# Patient Record
Sex: Female | Born: 1985 | Race: Black or African American | Hispanic: No | Marital: Married | Smoking: Current every day smoker
Health system: Southern US, Community
[De-identification: ages and names within clinical notes are randomized; demographics above are authoritative.]

## PROBLEM LIST (undated history)

## (undated) ENCOUNTER — Inpatient Hospital Stay (HOSPITAL_COMMUNITY): Payer: Self-pay

## (undated) DIAGNOSIS — I10 Essential (primary) hypertension: Secondary | ICD-10-CM

## (undated) DIAGNOSIS — O139 Gestational [pregnancy-induced] hypertension without significant proteinuria, unspecified trimester: Secondary | ICD-10-CM

## (undated) DIAGNOSIS — D649 Anemia, unspecified: Secondary | ICD-10-CM

## (undated) DIAGNOSIS — IMO0002 Reserved for concepts with insufficient information to code with codable children: Secondary | ICD-10-CM

## (undated) DIAGNOSIS — N938 Other specified abnormal uterine and vaginal bleeding: Secondary | ICD-10-CM

## (undated) DIAGNOSIS — F419 Anxiety disorder, unspecified: Secondary | ICD-10-CM

## (undated) DIAGNOSIS — R87619 Unspecified abnormal cytological findings in specimens from cervix uteri: Secondary | ICD-10-CM

## (undated) DIAGNOSIS — Z9109 Other allergy status, other than to drugs and biological substances: Secondary | ICD-10-CM

## (undated) DIAGNOSIS — M329 Systemic lupus erythematosus, unspecified: Secondary | ICD-10-CM

## (undated) HISTORY — PX: TUBAL LIGATION: SHX77

## (undated) HISTORY — DX: Anxiety disorder, unspecified: F41.9

## (undated) HISTORY — DX: Other specified abnormal uterine and vaginal bleeding: N93.8

## (undated) HISTORY — PX: CRYOTHERAPY: SHX1416

## (undated) HISTORY — DX: Essential (primary) hypertension: I10

---
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
## 2006-04-18 ENCOUNTER — Emergency Department (HOSPITAL_COMMUNITY): Admission: EM | Admit: 2006-04-18 | Discharge: 2006-04-19 | Payer: Self-pay | Admitting: Emergency Medicine

## 2007-01-24 ENCOUNTER — Encounter: Payer: Self-pay | Admitting: Obstetrics & Gynecology

## 2007-01-24 ENCOUNTER — Emergency Department (HOSPITAL_COMMUNITY): Admission: EM | Admit: 2007-01-24 | Discharge: 2007-01-25 | Payer: Self-pay | Admitting: Emergency Medicine

## 2007-02-15 ENCOUNTER — Inpatient Hospital Stay (HOSPITAL_COMMUNITY): Admission: AD | Admit: 2007-02-15 | Discharge: 2007-02-15 | Payer: Self-pay | Admitting: Obstetrics & Gynecology

## 2007-03-16 ENCOUNTER — Ambulatory Visit: Payer: Self-pay | Admitting: Obstetrics and Gynecology

## 2007-03-16 ENCOUNTER — Inpatient Hospital Stay (HOSPITAL_COMMUNITY): Admission: AD | Admit: 2007-03-16 | Discharge: 2007-03-16 | Payer: Self-pay | Admitting: Family Medicine

## 2007-03-17 ENCOUNTER — Ambulatory Visit (HOSPITAL_COMMUNITY): Admission: RE | Admit: 2007-03-17 | Discharge: 2007-03-17 | Payer: Self-pay | Admitting: Obstetrics

## 2007-07-09 ENCOUNTER — Inpatient Hospital Stay (HOSPITAL_COMMUNITY): Admission: AD | Admit: 2007-07-09 | Discharge: 2007-07-11 | Payer: Self-pay | Admitting: Obstetrics & Gynecology

## 2008-09-07 ENCOUNTER — Emergency Department (HOSPITAL_COMMUNITY): Admission: EM | Admit: 2008-09-07 | Discharge: 2008-09-08 | Payer: Self-pay | Admitting: Emergency Medicine

## 2010-01-07 ENCOUNTER — Emergency Department (HOSPITAL_COMMUNITY): Admission: EM | Admit: 2010-01-07 | Discharge: 2010-01-07 | Payer: Self-pay | Admitting: Emergency Medicine

## 2010-01-08 ENCOUNTER — Ambulatory Visit: Payer: Self-pay | Admitting: Family Medicine

## 2010-01-08 ENCOUNTER — Ambulatory Visit: Payer: Self-pay | Admitting: Internal Medicine

## 2010-01-09 ENCOUNTER — Telehealth: Payer: Self-pay | Admitting: Family Medicine

## 2010-01-09 ENCOUNTER — Ambulatory Visit: Payer: Self-pay | Admitting: Family Medicine

## 2010-01-09 ENCOUNTER — Observation Stay (HOSPITAL_COMMUNITY): Admission: EM | Admit: 2010-01-09 | Discharge: 2010-01-11 | Payer: Self-pay | Admitting: Emergency Medicine

## 2010-05-17 ENCOUNTER — Observation Stay (HOSPITAL_COMMUNITY): Admission: EM | Admit: 2010-05-17 | Discharge: 2010-05-17 | Payer: Self-pay | Admitting: Emergency Medicine

## 2010-07-03 ENCOUNTER — Observation Stay (HOSPITAL_COMMUNITY): Admission: EM | Admit: 2010-07-03 | Discharge: 2010-01-09 | Payer: Self-pay | Admitting: Emergency Medicine

## 2010-08-28 NOTE — Progress Notes (Signed)
Summary: Hemoptysis, Nausea, Diarrhea  Phone Note Call from Patient Call back at Home Phone [phone]   Caller: Patient Summary of Call: Discharged from hospital today.  Had been admitted for hemoptysis which had resolved in the hospital.  Sent home but warned to call if any issues.  Now is coughing up more blood, has had 4x diarrhea in past hour, nauseated.  These symptoms are new.  Discharge summary not yet available.  Review of chart indicates she was admitted for hemoptysis, 70 pound weight loss, abdominal pain.  Labs significant for +GC, +trichomonas, marijuana on UDS.  Radiology significant for ground glass appearance of lungs on CT scan.  Patient states she was treated for both GC and trich.  Asked if she could make it to morning to be seen in our clinic, but she states that she is too nauseated so I advised that she present to the ED for evaluation.  She verbalizes understanding and ability to get to the ED. Initial call taken by: Romero Belling MD,  January 09, 2010 6:00 PM

## 2010-10-08 LAB — URINE MICROSCOPIC-ADD ON

## 2010-10-08 LAB — DIFFERENTIAL
Basophils Absolute: 0 10*3/uL (ref 0.0–0.1)
Basophils Relative: 0 % (ref 0–1)
Eosinophils Absolute: 0 10*3/uL (ref 0.0–0.7)
Eosinophils Relative: 0 % (ref 0–5)
Lymphocytes Relative: 16 % (ref 12–46)
Lymphs Abs: 1.2 10*3/uL (ref 0.7–4.0)
Monocytes Absolute: 0.4 10*3/uL (ref 0.1–1.0)
Monocytes Relative: 5 % (ref 3–12)
Neutro Abs: 5.9 10*3/uL (ref 1.7–7.7)
Neutrophils Relative %: 79 % — ABNORMAL HIGH (ref 43–77)

## 2010-10-08 LAB — CBC
HCT: 37.1 % (ref 36.0–46.0)
Hemoglobin: 12.4 g/dL (ref 12.0–15.0)
MCH: 27.1 pg (ref 26.0–34.0)
MCHC: 33.4 g/dL (ref 30.0–36.0)
MCV: 81.2 fL (ref 78.0–100.0)
Platelets: 177 10*3/uL (ref 150–400)
RBC: 4.57 MIL/uL (ref 3.87–5.11)
RDW: 13.2 % (ref 11.5–15.5)
WBC: 7.4 10*3/uL (ref 4.0–10.5)

## 2010-10-08 LAB — POCT I-STAT, CHEM 8
BUN: 9 mg/dL (ref 6–23)
Calcium, Ion: 1.13 mmol/L (ref 1.12–1.32)
Chloride: 102 mEq/L (ref 96–112)
Creatinine, Ser: 1 mg/dL (ref 0.4–1.2)
Glucose, Bld: 98 mg/dL (ref 70–99)
HCT: 40 % (ref 36.0–46.0)
Hemoglobin: 13.6 g/dL (ref 12.0–15.0)
Potassium: 3.8 mEq/L (ref 3.5–5.1)
Sodium: 137 mEq/L (ref 135–145)
TCO2: 24 mmol/L (ref 0–100)

## 2010-10-08 LAB — URINALYSIS, ROUTINE W REFLEX MICROSCOPIC
Glucose, UA: NEGATIVE mg/dL
Ketones, ur: >80 mg/dL — AB
Leukocytes, UA: NEGATIVE
Nitrite: NEGATIVE
Protein, ur: 100 mg/dL — AB
Specific Gravity, Urine: 1.028 (ref 1.005–1.030)
Urobilinogen, UA: 1 mg/dL (ref 0.0–1.0)
pH: 6 (ref 5.0–8.0)

## 2010-10-08 LAB — POCT PREGNANCY, URINE: Preg Test, Ur: NEGATIVE

## 2010-10-12 LAB — STOOL CULTURE

## 2010-10-12 LAB — URINALYSIS, ROUTINE W REFLEX MICROSCOPIC
Bilirubin Urine: NEGATIVE
Glucose, UA: NEGATIVE mg/dL
Glucose, UA: NEGATIVE mg/dL
Hgb urine dipstick: NEGATIVE
Hgb urine dipstick: NEGATIVE
Ketones, ur: 40 mg/dL — AB
Ketones, ur: NEGATIVE mg/dL
Nitrite: NEGATIVE
Nitrite: NEGATIVE
Protein, ur: NEGATIVE mg/dL
Protein, ur: NEGATIVE mg/dL
Specific Gravity, Urine: 1.006 (ref 1.005–1.030)
Specific Gravity, Urine: 1.023 (ref 1.005–1.030)
Urobilinogen, UA: 1 mg/dL (ref 0.0–1.0)
Urobilinogen, UA: 2 mg/dL — ABNORMAL HIGH (ref 0.0–1.0)
pH: 6 (ref 5.0–8.0)
pH: 6.5 (ref 5.0–8.0)

## 2010-10-12 LAB — BASIC METABOLIC PANEL
BUN: 3 mg/dL — ABNORMAL LOW (ref 6–23)
BUN: 6 mg/dL (ref 6–23)
CO2: 26 mEq/L (ref 19–32)
CO2: 27 mEq/L (ref 19–32)
Calcium: 8.1 mg/dL — ABNORMAL LOW (ref 8.4–10.5)
Calcium: 8.6 mg/dL (ref 8.4–10.5)
Chloride: 108 mEq/L (ref 96–112)
Chloride: 109 mEq/L (ref 96–112)
Creatinine, Ser: 0.77 mg/dL (ref 0.4–1.2)
Creatinine, Ser: 0.89 mg/dL (ref 0.4–1.2)
GFR calc Af Amer: >60 mL/min (ref >60)
GFR calc Af Amer: >60 mL/min (ref >60)
GFR calc non Af Amer: >60 mL/min (ref >60)
GFR calc non Af Amer: >60 mL/min (ref >60)
Glucose, Bld: 79 mg/dL (ref 70–99)
Glucose, Bld: 81 mg/dL (ref 70–99)
Potassium: 3.6 mEq/L (ref 3.5–5.1)
Potassium: 3.9 mEq/L (ref 3.5–5.1)
Sodium: 139 mEq/L (ref 135–145)
Sodium: 139 mEq/L (ref 135–145)

## 2010-10-12 LAB — URINE DRUGS OF ABUSE SCREEN W ALC, ROUTINE (REF LAB)
Amphetamine Screen, Ur: NEGATIVE
Barbiturate Quant, Ur: NEGATIVE
Benzodiazepines.: NEGATIVE
Cocaine Metabolites: NEGATIVE
Creatinine,U: 51.3 mg/dL
Ethyl Alcohol: <10 mg/dL (ref <10)
Marijuana Metabolite: POSITIVE — AB
Methadone: NEGATIVE
Opiate Screen, Urine: NEGATIVE
Phencyclidine (PCP): NEGATIVE
Propoxyphene: NEGATIVE

## 2010-10-12 LAB — FECAL LACTOFERRIN, QUANT: Fecal Lactoferrin: NEGATIVE

## 2010-10-12 LAB — DIFFERENTIAL
Basophils Absolute: 0 10*3/uL (ref 0.0–0.1)
Basophils Absolute: 0 10*3/uL (ref 0.0–0.1)
Basophils Relative: 0 % (ref 0–1)
Basophils Relative: 1 % (ref 0–1)
Eosinophils Absolute: 0 10*3/uL (ref 0.0–0.7)
Eosinophils Absolute: 0.1 10*3/uL (ref 0.0–0.7)
Eosinophils Relative: 0 % (ref 0–5)
Eosinophils Relative: 2 % (ref 0–5)
Lymphocytes Relative: 14 % (ref 12–46)
Lymphocytes Relative: 33 % (ref 12–46)
Lymphs Abs: 1.3 10*3/uL (ref 0.7–4.0)
Lymphs Abs: 1.8 10*3/uL (ref 0.7–4.0)
Monocytes Absolute: 0.3 10*3/uL (ref 0.1–1.0)
Monocytes Absolute: 0.6 10*3/uL (ref 0.1–1.0)
Monocytes Relative: 10 % (ref 3–12)
Monocytes Relative: 3 % (ref 3–12)
Neutro Abs: 3 10*3/uL (ref 1.7–7.7)
Neutro Abs: 7.5 10*3/uL (ref 1.7–7.7)
Neutrophils Relative %: 54 % (ref 43–77)
Neutrophils Relative %: 83 % — ABNORMAL HIGH (ref 43–77)

## 2010-10-12 LAB — GIARDIA/CRYPTOSPORIDIUM SCREEN(EIA)
Cryptosporidium Screen (EIA): NEGATIVE
Giardia Screen - EIA: NEGATIVE

## 2010-10-12 LAB — POCT I-STAT, CHEM 8
BUN: 4 mg/dL — ABNORMAL LOW (ref 6–23)
Calcium, Ion: 1.07 mmol/L — ABNORMAL LOW (ref 1.12–1.32)
Chloride: 105 mEq/L (ref 96–112)
Creatinine, Ser: 0.8 mg/dL (ref 0.4–1.2)
Glucose, Bld: 91 mg/dL (ref 70–99)
HCT: 39 % (ref 36.0–46.0)
Hemoglobin: 13.3 g/dL (ref 12.0–15.0)
Potassium: 3.9 mEq/L (ref 3.5–5.1)
Sodium: 137 mEq/L (ref 135–145)
TCO2: 23 mmol/L (ref 0–100)

## 2010-10-12 LAB — CULTURE, BLOOD (ROUTINE X 2)
Culture: NO GROWTH
Culture: NO GROWTH

## 2010-10-12 LAB — ANA: Anti Nuclear Antibody(ANA): NEGATIVE

## 2010-10-12 LAB — QUANTIFERON TB GOLD ASSAY (BLOOD)
Interferon Gamma Release Assay: NEGATIVE
Mitogen Minus Nil Value: >10 IU/mL
Quantiferon Nil Value: 0.15 IU/mL
TB Antigen Minus Nil Value: 0.16 IU/mL

## 2010-10-12 LAB — CLOSTRIDIUM DIFFICILE EIA
C difficile Toxins A+B, EIA: NEGATIVE
C difficile Toxins A+B, EIA: NEGATIVE

## 2010-10-12 LAB — CBC
HCT: 33.6 % — ABNORMAL LOW (ref 36.0–46.0)
HCT: 37.3 % (ref 36.0–46.0)
Hemoglobin: 11.4 g/dL — ABNORMAL LOW (ref 12.0–15.0)
Hemoglobin: 12.6 g/dL (ref 12.0–15.0)
MCHC: 33.7 g/dL (ref 30.0–36.0)
MCHC: 34 g/dL (ref 30.0–36.0)
MCV: 83.4 fL (ref 78.0–100.0)
MCV: 83.9 fL (ref 78.0–100.0)
Platelets: 177 10*3/uL (ref 150–400)
Platelets: 195 10*3/uL (ref 150–400)
RBC: 4.03 MIL/uL (ref 3.87–5.11)
RBC: 4.45 MIL/uL (ref 3.87–5.11)
RDW: 12.4 % (ref 11.5–15.5)
RDW: 12.9 % (ref 11.5–15.5)
WBC: 5.5 10*3/uL (ref 4.0–10.5)
WBC: 9.1 10*3/uL (ref 4.0–10.5)

## 2010-10-12 LAB — HEPATITIS PANEL, ACUTE
HCV Ab: NEGATIVE
Hep A IgM: NEGATIVE
Hep B C IgM: NEGATIVE
Hepatitis B Surface Ag: NEGATIVE

## 2010-10-12 LAB — HEMOGLOBIN A1C
Hgb A1c MFr Bld: 5.4 % (ref <5.7)
Mean Plasma Glucose: 108 mg/dL (ref <117)

## 2010-10-12 LAB — LEGIONELLA ANTIGEN, URINE: Legionella Antigen, Urine: NEGATIVE

## 2010-10-12 LAB — RPR: RPR Ser Ql: NONREACTIVE

## 2010-10-12 LAB — URINE MICROSCOPIC-ADD ON

## 2010-10-12 LAB — HEMOGLOBIN: Hemoglobin: 11.4 g/dL — ABNORMAL LOW (ref 12.0–15.0)

## 2010-10-12 LAB — GLIADIN ANTIBODIES, SERUM
Gliadin IgA: 4.6 U/mL (ref <20)
Gliadin IgG: 6.9 U/mL (ref <20)

## 2010-10-12 LAB — THC (MARIJUANA), URINE, CONFIRMATION: Marijuana, Ur-Confirmation: 202 NG/ML — ABNORMAL HIGH

## 2010-10-12 LAB — HEMOCCULT GUIAC POC 1CARD (OFFICE): Fecal Occult Bld: NEGATIVE

## 2010-10-12 LAB — ENDOMYSIAL IGA ANTIBODY: Endomysial IgA Autoabs: NEGATIVE

## 2010-10-12 LAB — COLD AGGLUTININ TITER

## 2010-10-12 LAB — POCT PREGNANCY, URINE: Preg Test, Ur: NEGATIVE

## 2010-10-12 LAB — PROTIME-INR
INR: 1.05 (ref 0.00–1.49)
Prothrombin Time: 13.6 seconds (ref 11.6–15.2)

## 2010-10-12 LAB — C-REACTIVE PROTEIN: CRP: 2.7 mg/dL — ABNORMAL HIGH (ref <0.6)

## 2010-10-12 LAB — MYCOPLASMA PNEUMONIAE ANTIBODY, IGM: Mycoplasma pneumo IgM: 248 Units/mL (ref <770)

## 2010-10-12 LAB — SEDIMENTATION RATE: Sed Rate: 7 mm/hr (ref 0–22)

## 2010-10-13 LAB — WET PREP, GENITAL
Clue Cells Wet Prep HPF POC: NONE SEEN
Yeast Wet Prep HPF POC: NONE SEEN

## 2010-10-13 LAB — COMPREHENSIVE METABOLIC PANEL
ALT: 11 U/L (ref 0–35)
AST: 18 U/L (ref 0–37)
Albumin: 3.6 g/dL (ref 3.5–5.2)
Alkaline Phosphatase: 44 U/L (ref 39–117)
BUN: 7 mg/dL (ref 6–23)
CO2: 25 mEq/L (ref 19–32)
Calcium: 8.8 mg/dL (ref 8.4–10.5)
Chloride: 105 mEq/L (ref 96–112)
Creatinine, Ser: 0.79 mg/dL (ref 0.4–1.2)
GFR calc Af Amer: >60 mL/min (ref >60)
GFR calc non Af Amer: >60 mL/min (ref >60)
Glucose, Bld: 67 mg/dL — ABNORMAL LOW (ref 70–99)
Potassium: 4.4 mEq/L (ref 3.5–5.1)
Sodium: 137 mEq/L (ref 135–145)
Total Bilirubin: 1.2 mg/dL (ref 0.3–1.2)
Total Protein: 6.3 g/dL (ref 6.0–8.3)

## 2010-10-13 LAB — GLUCOSE, CAPILLARY
Glucose-Capillary: 89 mg/dL (ref 70–99)
Glucose-Capillary: 97 mg/dL (ref 70–99)

## 2010-10-13 LAB — POCT URINALYSIS DIP (DEVICE)
Bilirubin Urine: NEGATIVE
Glucose, UA: NEGATIVE mg/dL
Hgb urine dipstick: NEGATIVE
Ketones, ur: NEGATIVE mg/dL
Nitrite: NEGATIVE
Protein, ur: NEGATIVE mg/dL
Specific Gravity, Urine: 1.02 (ref 1.005–1.030)
Urobilinogen, UA: 0.2 mg/dL (ref 0.0–1.0)
pH: 8.5 — ABNORMAL HIGH (ref 5.0–8.0)

## 2010-10-13 LAB — DIFFERENTIAL
Basophils Absolute: 0 10*3/uL (ref 0.0–0.1)
Basophils Relative: 0 % (ref 0–1)
Eosinophils Absolute: 0 10*3/uL (ref 0.0–0.7)
Eosinophils Relative: 0 % (ref 0–5)
Lymphocytes Relative: 14 % (ref 12–46)
Lymphs Abs: 1.5 10*3/uL (ref 0.7–4.0)
Monocytes Absolute: 0.6 10*3/uL (ref 0.1–1.0)
Monocytes Relative: 6 % (ref 3–12)
Neutro Abs: 7.9 10*3/uL — ABNORMAL HIGH (ref 1.7–7.7)
Neutrophils Relative %: 79 % — ABNORMAL HIGH (ref 43–77)

## 2010-10-13 LAB — POCT I-STAT, CHEM 8
BUN: 7 mg/dL (ref 6–23)
Calcium, Ion: 1.15 mmol/L (ref 1.12–1.32)
Chloride: 106 mEq/L (ref 96–112)
Creatinine, Ser: 0.9 mg/dL (ref 0.4–1.2)
Glucose, Bld: 85 mg/dL (ref 70–99)
HCT: 41 % (ref 36.0–46.0)
Hemoglobin: 13.9 g/dL (ref 12.0–15.0)
Potassium: 4.2 mEq/L (ref 3.5–5.1)
Sodium: 137 mEq/L (ref 135–145)
TCO2: 25 mmol/L (ref 0–100)

## 2010-10-13 LAB — CBC
HCT: 38.2 % (ref 36.0–46.0)
Hemoglobin: 13 g/dL (ref 12.0–15.0)
MCHC: 34.2 g/dL (ref 30.0–36.0)
MCV: 84 fL (ref 78.0–100.0)
Platelets: 202 10*3/uL (ref 150–400)
RBC: 4.54 MIL/uL (ref 3.87–5.11)
RDW: 12.7 % (ref 11.5–15.5)
WBC: 10.1 10*3/uL (ref 4.0–10.5)

## 2010-10-13 LAB — GC/CHLAMYDIA PROBE AMP, GENITAL
Chlamydia, DNA Probe: NEGATIVE
GC Probe Amp, Genital: POSITIVE — AB

## 2010-10-13 LAB — RPR: RPR Ser Ql: NONREACTIVE

## 2010-10-13 LAB — LIPASE, BLOOD: Lipase: 19 U/L (ref 11–59)

## 2010-10-13 LAB — POCT PREGNANCY, URINE: Preg Test, Ur: NEGATIVE

## 2010-10-13 LAB — TSH: TSH: 2.105 u[IU]/mL (ref 0.350–4.500)

## 2010-10-13 LAB — HIV-1 RNA QUANT-NO REFLEX-BLD
HIV 1 RNA Quant: <48 copies/mL (ref <48)
HIV-1 RNA Quant, Log: <1.68 {Log} (ref <1.68)

## 2010-10-13 LAB — PROTIME-INR
INR: 1.06 (ref 0.00–1.49)
Prothrombin Time: 13.7 seconds (ref 11.6–15.2)

## 2010-10-13 LAB — HIV ANTIBODY (ROUTINE TESTING W REFLEX): HIV: NONREACTIVE

## 2010-10-13 LAB — APTT: aPTT: 27 seconds (ref 24–37)

## 2010-11-11 LAB — POCT I-STAT, CHEM 8
BUN: 4 mg/dL — ABNORMAL LOW (ref 6–23)
Calcium, Ion: 1.08 mmol/L — ABNORMAL LOW (ref 1.12–1.32)
Chloride: 105 mEq/L (ref 96–112)
Creatinine, Ser: 0.7 mg/dL (ref 0.4–1.2)
Glucose, Bld: 79 mg/dL (ref 70–99)
HCT: 35 % — ABNORMAL LOW (ref 36.0–46.0)
Hemoglobin: 11.9 g/dL — ABNORMAL LOW (ref 12.0–15.0)
Potassium: 3.3 mEq/L — ABNORMAL LOW (ref 3.5–5.1)
Sodium: 140 mEq/L (ref 135–145)
TCO2: 24 mmol/L (ref 0–100)

## 2010-11-11 LAB — URINE MICROSCOPIC-ADD ON

## 2010-11-11 LAB — URINALYSIS, ROUTINE W REFLEX MICROSCOPIC
Glucose, UA: NEGATIVE mg/dL
Hgb urine dipstick: NEGATIVE
Ketones, ur: 40 mg/dL — AB
Nitrite: NEGATIVE
Protein, ur: NEGATIVE mg/dL
Specific Gravity, Urine: 1.027 (ref 1.005–1.030)
Urobilinogen, UA: 1 mg/dL (ref 0.0–1.0)
pH: 6 (ref 5.0–8.0)

## 2010-11-11 LAB — POCT PREGNANCY, URINE: Preg Test, Ur: NEGATIVE

## 2010-11-11 LAB — D-DIMER, QUANTITATIVE: D-Dimer, Quant: 5.35 ug/mL-FEU — ABNORMAL HIGH (ref 0.00–0.48)

## 2011-04-23 ENCOUNTER — Emergency Department (HOSPITAL_COMMUNITY)
Admission: EM | Admit: 2011-04-23 | Discharge: 2011-04-23 | Disposition: A | Discharge status: Home / Self Care | Payer: 59 | Attending: Emergency Medicine | Admitting: Emergency Medicine

## 2011-04-23 ENCOUNTER — Emergency Department (HOSPITAL_COMMUNITY): Payer: 59

## 2011-04-23 DIAGNOSIS — R079 Chest pain, unspecified: Secondary | ICD-10-CM | POA: Insufficient documentation

## 2011-04-23 DIAGNOSIS — R071 Chest pain on breathing: Secondary | ICD-10-CM | POA: Insufficient documentation

## 2011-04-23 LAB — COMPREHENSIVE METABOLIC PANEL
ALT: 9 U/L (ref 0–35)
AST: 16 U/L (ref 0–37)
Albumin: 4 g/dL (ref 3.5–5.2)
Alkaline Phosphatase: 49 U/L (ref 39–117)
BUN: 10 mg/dL (ref 6–23)
CO2: 27 mEq/L (ref 19–32)
Calcium: 9.3 mg/dL (ref 8.4–10.5)
Chloride: 105 mEq/L (ref 96–112)
Creatinine, Ser: 0.76 mg/dL (ref 0.50–1.10)
GFR calc Af Amer: >60 mL/min (ref >60)
GFR calc non Af Amer: >60 mL/min (ref >60)
Glucose, Bld: 79 mg/dL (ref 70–99)
Potassium: 4.1 mEq/L (ref 3.5–5.1)
Sodium: 139 mEq/L (ref 135–145)
Total Bilirubin: 0.7 mg/dL (ref 0.3–1.2)
Total Protein: 7.5 g/dL (ref 6.0–8.3)

## 2011-04-23 LAB — POCT I-STAT TROPONIN I: Troponin i, poc: 0 ng/mL (ref 0.00–0.08)

## 2011-04-23 LAB — CBC
HCT: 41.3 % (ref 36.0–46.0)
Hemoglobin: 13.9 g/dL (ref 12.0–15.0)
MCH: 27.9 pg (ref 26.0–34.0)
MCHC: 33.7 g/dL (ref 30.0–36.0)
MCV: 82.9 fL (ref 78.0–100.0)
Platelets: 204 10*3/uL (ref 150–400)
RBC: 4.98 MIL/uL (ref 3.87–5.11)
RDW: 13.8 % (ref 11.5–15.5)
WBC: 4.3 10*3/uL (ref 4.0–10.5)

## 2011-04-23 LAB — POCT PREGNANCY, URINE: Preg Test, Ur: NEGATIVE

## 2011-05-04 LAB — RAPID URINE DRUG SCREEN, HOSP PERFORMED
Amphetamines: NOT DETECTED
Barbiturates: NOT DETECTED
Benzodiazepines: NOT DETECTED
Cocaine: NOT DETECTED
Opiates: NOT DETECTED
Tetrahydrocannabinol: NOT DETECTED

## 2011-05-04 LAB — CBC
HCT: 34.1 — ABNORMAL LOW
HCT: 39.4
Hemoglobin: 11.5 — ABNORMAL LOW
Hemoglobin: 13.5
MCHC: 33.8
MCHC: 34.4
MCV: 81.3
MCV: 81.4
Platelets: 221
Platelets: 261
RBC: 4.18
RBC: 4.84
RDW: 14
RDW: 14.1
WBC: 14.1 — ABNORMAL HIGH
WBC: 9

## 2011-05-04 LAB — RPR: RPR Ser Ql: NONREACTIVE

## 2011-05-08 LAB — WET PREP, GENITAL
Clue Cells Wet Prep HPF POC: NONE SEEN
Trich, Wet Prep: NONE SEEN
Yeast Wet Prep HPF POC: NONE SEEN

## 2011-05-08 LAB — URINALYSIS, ROUTINE W REFLEX MICROSCOPIC
Glucose, UA: 100 — AB
Hgb urine dipstick: NEGATIVE
Ketones, ur: 15 — AB
Nitrite: NEGATIVE
Protein, ur: NEGATIVE
Specific Gravity, Urine: 1.025
Urobilinogen, UA: 1
pH: 6.5

## 2011-05-11 LAB — URINALYSIS, ROUTINE W REFLEX MICROSCOPIC
Glucose, UA: NEGATIVE
Hgb urine dipstick: NEGATIVE
Ketones, ur: 15 — AB
Nitrite: NEGATIVE
Protein, ur: NEGATIVE
Specific Gravity, Urine: 1.025
Urobilinogen, UA: 1
pH: 7

## 2011-05-11 LAB — URINE CULTURE: Colony Count: 50000

## 2011-05-11 LAB — URINE MICROSCOPIC-ADD ON

## 2011-05-13 LAB — URINALYSIS, ROUTINE W REFLEX MICROSCOPIC
Bilirubin Urine: NEGATIVE
Glucose, UA: NEGATIVE
Hgb urine dipstick: NEGATIVE
Ketones, ur: NEGATIVE
Nitrite: NEGATIVE
Protein, ur: NEGATIVE
Specific Gravity, Urine: 1.02
Urobilinogen, UA: 0.2
pH: 6.5

## 2011-05-13 LAB — CBC
HCT: 36.2
Hemoglobin: 12.1
MCHC: 33.5
MCV: 80.3
Platelets: 253
RBC: 4.51
RDW: 12.7
WBC: 8.8

## 2011-05-13 LAB — URINE MICROSCOPIC-ADD ON

## 2011-05-13 LAB — WET PREP, GENITAL
Clue Cells Wet Prep HPF POC: NONE SEEN
Trich, Wet Prep: NONE SEEN
Yeast Wet Prep HPF POC: NONE SEEN

## 2011-05-13 LAB — GC/CHLAMYDIA PROBE AMP, GENITAL
Chlamydia, DNA Probe: NEGATIVE
GC Probe Amp, Genital: NEGATIVE

## 2011-10-18 ENCOUNTER — Emergency Department (HOSPITAL_COMMUNITY): Payer: 59

## 2011-10-18 ENCOUNTER — Encounter (HOSPITAL_COMMUNITY): Payer: Self-pay | Admitting: *Deleted

## 2011-10-18 ENCOUNTER — Emergency Department (HOSPITAL_COMMUNITY)
Admission: EM | Admit: 2011-10-18 | Discharge: 2011-10-18 | Disposition: A | Discharge status: Home / Self Care | Payer: 59 | Attending: Emergency Medicine | Admitting: Emergency Medicine

## 2011-10-18 DIAGNOSIS — M25559 Pain in unspecified hip: Secondary | ICD-10-CM | POA: Insufficient documentation

## 2011-10-18 DIAGNOSIS — IMO0001 Reserved for inherently not codable concepts without codable children: Secondary | ICD-10-CM | POA: Insufficient documentation

## 2011-10-18 DIAGNOSIS — F17200 Nicotine dependence, unspecified, uncomplicated: Secondary | ICD-10-CM | POA: Insufficient documentation

## 2011-10-18 DIAGNOSIS — M25551 Pain in right hip: Secondary | ICD-10-CM

## 2011-10-18 DIAGNOSIS — M549 Dorsalgia, unspecified: Secondary | ICD-10-CM | POA: Insufficient documentation

## 2011-10-18 HISTORY — DX: Other allergy status, other than to drugs and biological substances: Z91.09

## 2011-10-18 MED ORDER — HYDROCODONE-ACETAMINOPHEN 5-500 MG PO TABS
1.0000 | ORAL_TABLET | Freq: Four times a day (QID) | ORAL | Status: AC | PRN
Start: 2011-10-18 — End: 2011-10-28

## 2011-10-18 MED ORDER — PREDNISONE 20 MG PO TABS: 40.0000 mg | ORAL_TABLET | Freq: Every day | ORAL | Status: DC

## 2011-10-18 MED ORDER — NAPROXEN 500 MG PO TABS: 500.0000 mg | ORAL_TABLET | Freq: Two times a day (BID) | ORAL | Status: DC

## 2011-10-18 NOTE — ED Provider Notes (Signed)
History     CSN: [id number]  Arrival date & time 10/18/11  1339   First MD Initiated Contact with Patient 10/18/11 1518      Chief Complaint  Patient presents with  . Hip Pain    right    (Consider location/radiation/quality/duration/timing/severity/associated sxs/prior treatment) Patient is a 26 y.o. female presenting with hip pain. The history is provided by the patient.  Hip Pain This is a new problem. The current episode started yesterday. The problem occurs constantly. The problem has been unchanged. Associated symptoms include arthralgias. Pertinent negatives include no abdominal pain, chills, fever, numbness, urinary symptoms or weakness. The symptoms are aggravated by walking and bending. She has tried nothing for the symptoms.  Pt states she woke up with pain in right hip, pain is on the front and the back. Pain with moving right hip and walking. Today started having "popping" in that hip each time she takes a step. No hx of the same. Denies any known injuries. Denies urinary symptoms, abdominal pain, back pain.   Past Medical History  Diagnosis Date  . Environmental allergies     History reviewed. No pertinent past surgical history.  History reviewed. No pertinent family history.  History  Substance Use Topics  . Smoking status: Current Everyday Smoker -- 1.0 packs/day    Types: Cigarettes  . Smokeless tobacco: Never Used  . Alcohol Use: No    OB History    Grav Para Term Preterm Abortions TAB SAB Ect Mult Living                  Review of Systems  Constitutional: Negative for fever and chills.  HENT: Negative.   Eyes: Negative.   Respiratory: Negative.   Cardiovascular: Negative.   Gastrointestinal: Negative.  Negative for abdominal pain.  Genitourinary: Negative.   Musculoskeletal: Positive for arthralgias. Negative for back pain.  Skin: Negative.   Neurological: Negative for weakness and numbness.  Psychiatric/Behavioral: Negative.     Allergies    Review of patient's allergies indicates no known allergies.  Home Medications   Current Outpatient Rx  Name Route Sig Dispense Refill  . IBUPROFEN 200 MG PO TABS Oral Take 400 mg by mouth every 6 (six) hours as needed. For pain      BP 124/86  Pulse 65  Temp(Src) 99 F (37.2 C) (Oral)  Resp 20  SpO2 100%  LMP 10/08/2011  Physical Exam  Nursing note and vitals reviewed. Constitutional: She is oriented to person, place, and time. She appears well-developed and well-nourished.  HENT:  Head: Normocephalic and atraumatic.  Neck: Neck supple.  Cardiovascular: Normal rate, regular rhythm and normal heart sounds.   Pulmonary/Chest: Effort normal and breath sounds normal. No respiratory distress.  Abdominal: Soft. Bowel sounds are normal. There is no tenderness.  Musculoskeletal: Normal range of motion. She exhibits no edema.       Normal appearing right hip. Tenderness to palpation over the right inguinal region. Pain with right hip flexion, and external rotation. Pain with right straight leg raise in the back going down right buttocks. Pain with flexion of the hip against resistance and with abduction against resistance. Normal lumbar spine, normal right knee exam.   Neurological: She is alert and oriented to person, place, and time.  Skin: Skin is warm and dry.  Psychiatric: She has a normal mood and affect.    ED Course  Procedures (including critical care time)  Right hip pain with no injuries. Some exam findings consistent with  possible sciatica. Pain just started yesterday. Suspect either sprain vs sciatica. Joint does not apepar infected. Normal x-ray. Pt ambulatory, non toxic, normal VS. Will treat with medications, rest, follow up.   Dg Hip Complete Right  10/18/2011  *RADIOLOGY REPORT*  Clinical Data: Right hip pain.  No known injury.  RIGHT HIP - COMPLETE 2+ VIEW  Comparison: CTabdomen and pelvis 01/07/2010.  Findings: No acute bony or joint abnormality is identified.  No  evidence of avascular necrosis.  Small sclerotic lesion in the left femoral neck is compatible with a bone island, unchanged.  IMPRESSION: Negative exam.  Original Report Authenticated By: Bernadene Bell. Maricela Curet, M.D.      No diagnosis found.    MDM          Lottie Mussel, PA 10/18/11 1645

## 2011-10-18 NOTE — ED Provider Notes (Signed)
Medical screening examination/treatment/procedure(s) were performed by non-physician practitioner and as supervising physician I was immediately available for consultation/collaboration.  Hughie Melroy T Jovanny Stephanie, MD 10/18/11 2327 

## 2011-10-18 NOTE — ED Notes (Signed)
Pt from home with reports of right hip pain that started yesterday but reports hearing a popping noise that started this morning. Pt also endorses an increase in exercise about a month ago but denies injury.

## 2011-10-18 NOTE — Discharge Instructions (Signed)
Your x-ray of the hip looks normal. i suspect you may have a hip sprain or maybe something that is called sciatica. Read below. Ice your hip several times a day, rest. Take naprosyn for pain and inflammation. vicodin as prescribed as needed for severe pain. Prednisone as prescribed until all gone for inflammation. If not improving in 5-7 days, follow up with orthopedics as referred, or you may follow up with your primary care doctor.  Sciatica Sciatica is a weakness and/or changes in sensation (tingling, jolts, hot and cold, numbness) along the path the sciatic nerve travels. Irritation or damage to lumbar nerve roots is often also referred to as lumbar radiculopathy.  Lumbar radiculopathy (Sciatica) is the most common form of this problem. Radiculopathy can occur in any of the nerves coming out of the spinal cord. The problems caused depend on which nerves are involved. The sciatic nerve is the large nerve supplying the branches of nerves going from the hip to the toes. It often causes a numbness or weakness in the skin and/or muscles that the sciatic nerve serves. It also may cause symptoms (problems) of pain, burning, tingling, or electric shock-like feelings in the path of this nerve. This usually comes from injury to the fibers that make up the sciatic nerve. Some of these symptoms are low back pain and/or unpleasant feelings in the following areas:  From the mid-buttock down the back of the leg to the back of the knee.   And/or the outside of the calf and top of the foot.   And/or behind the inner ankle to the sole of the foot.  CAUSES   Herniated or slipped disc. Discs are the little cushions between the bones in the back.   Pressure by the piriformis muscle in the buttock on the sciatic nerve (Piriformis Syndrome).   Misalignment of the bones in the lower back and buttocks (Sacroiliac Joint Derangement).   Narrowing of the spinal canal that puts pressure on or pinches the fibers that make  up the sciatic nerve.   A slipped vertebra that is out of line with those above or beneath it.   Abnormality of the nervous system itself so that nerve fibers do not transmit signals properly, especially to feet and calves (neuropathy).   Tumor (this is rare).  Your caregiver can usually determine the cause of your sciatica and begin the treatment most likely to help you. TREATMENT  Taking over-the-counter painkillers, physical therapy, rest, exercise, spinal manipulation, and injections of anesthetics and/or steroids may be used. Surgery, acupuncture, and Yoga can also be effective. Mind over matter techniques, mental imagery, and changing factors such as your bed, chair, desk height, posture, and activities are other treatments that may be helpful. You and your caregiver can help determine what is best for you. With proper diagnosis, the cause of most sciatica can be identified and removed. Communication and cooperation between your caregiver and you is essential. If you are not successful immediately, do not be discouraged. With time, a proper treatment can be found that will make you comfortable. HOME CARE INSTRUCTIONS   If the pain is coming from a problem in the back, applying ice to that area for 15 to 20 minutes, 3 to 4 times per day while awake, may be helpful. Put the ice in a plastic bag. Place a towel between the bag of ice and your skin.   You may exercise or perform your usual activities if these do not aggravate your pain, or as suggested  by your caregiver.   Only take over-the-counter or prescription medicines for pain, discomfort, or fever as directed by your caregiver.   If your caregiver has given you a follow-up appointment, it is very important to keep that appointment. Not keeping the appointment could result in a chronic or permanent injury, pain, and disability. If there is any problem keeping the appointment, you must call back to this facility for assistance.  SEEK  IMMEDIATE MEDICAL CARE IF:   You experience loss of control of bowel or bladder.   You have increasing weakness in the trunk, buttocks, or legs.   There is numbness in any areas from the hip down to the toes.   You have difficulty walking or keeping your balance.   You have any of the above, with fever or forceful vomiting.  Document Released: 07/07/2001 Document Revised: 07/02/2011 Document Reviewed: 02/24/2008 [facility] Patient Information 2012 [place], [place].

## 2012-02-25 ENCOUNTER — Encounter (HOSPITAL_COMMUNITY): Payer: Self-pay

## 2012-02-25 ENCOUNTER — Emergency Department (HOSPITAL_COMMUNITY)
Admission: EM | Admit: 2012-02-25 | Discharge: 2012-02-25 | Disposition: A | Discharge status: Home / Self Care | Payer: Self-pay | Attending: Emergency Medicine | Admitting: Emergency Medicine

## 2012-02-25 DIAGNOSIS — F17200 Nicotine dependence, unspecified, uncomplicated: Secondary | ICD-10-CM | POA: Insufficient documentation

## 2012-02-25 DIAGNOSIS — A499 Bacterial infection, unspecified: Secondary | ICD-10-CM | POA: Insufficient documentation

## 2012-02-25 DIAGNOSIS — N760 Acute vaginitis: Secondary | ICD-10-CM | POA: Insufficient documentation

## 2012-02-25 DIAGNOSIS — N898 Other specified noninflammatory disorders of vagina: Secondary | ICD-10-CM | POA: Insufficient documentation

## 2012-02-25 DIAGNOSIS — R102 Pelvic and perineal pain: Secondary | ICD-10-CM

## 2012-02-25 DIAGNOSIS — B9689 Other specified bacterial agents as the cause of diseases classified elsewhere: Secondary | ICD-10-CM | POA: Insufficient documentation

## 2012-02-25 DIAGNOSIS — N939 Abnormal uterine and vaginal bleeding, unspecified: Secondary | ICD-10-CM

## 2012-02-25 LAB — URINE MICROSCOPIC-ADD ON

## 2012-02-25 LAB — URINALYSIS, ROUTINE W REFLEX MICROSCOPIC
Bilirubin Urine: NEGATIVE
Glucose, UA: NEGATIVE mg/dL
Ketones, ur: NEGATIVE mg/dL
Leukocytes, UA: NEGATIVE
Nitrite: NEGATIVE
Protein, ur: NEGATIVE mg/dL
Specific Gravity, Urine: 1.013 (ref 1.005–1.030)
Urobilinogen, UA: 0.2 mg/dL (ref 0.0–1.0)
pH: 6.5 (ref 5.0–8.0)

## 2012-02-25 LAB — CBC WITH DIFFERENTIAL/PLATELET
Basophils Absolute: 0 10*3/uL (ref 0.0–0.1)
Basophils Relative: 1 % (ref 0–1)
Eosinophils Absolute: 0.1 10*3/uL (ref 0.0–0.7)
Eosinophils Relative: 1 % (ref 0–5)
HCT: 39 % (ref 36.0–46.0)
Hemoglobin: 13 g/dL (ref 12.0–15.0)
Lymphocytes Relative: 50 % — ABNORMAL HIGH (ref 12–46)
Lymphs Abs: 2 10*3/uL (ref 0.7–4.0)
MCH: 27.8 pg (ref 26.0–34.0)
MCHC: 33.3 g/dL (ref 30.0–36.0)
MCV: 83.3 fL (ref 78.0–100.0)
Monocytes Absolute: 0.4 10*3/uL (ref 0.1–1.0)
Monocytes Relative: 9 % (ref 3–12)
Neutro Abs: 1.6 10*3/uL — ABNORMAL LOW (ref 1.7–7.7)
Neutrophils Relative %: 40 % — ABNORMAL LOW (ref 43–77)
Platelets: 217 10*3/uL (ref 150–400)
RBC: 4.68 MIL/uL (ref 3.87–5.11)
RDW: 13.9 % (ref 11.5–15.5)
WBC: 4 10*3/uL (ref 4.0–10.5)

## 2012-02-25 LAB — WET PREP, GENITAL
Trich, Wet Prep: NONE SEEN
Yeast Wet Prep HPF POC: NONE SEEN

## 2012-02-25 LAB — POCT I-STAT, CHEM 8
BUN: 8 mg/dL (ref 6–23)
Calcium, Ion: 1.21 mmol/L (ref 1.12–1.23)
Chloride: 105 mEq/L (ref 96–112)
Creatinine, Ser: 0.9 mg/dL (ref 0.50–1.10)
Glucose, Bld: 77 mg/dL (ref 70–99)
HCT: 40 % (ref 36.0–46.0)
Hemoglobin: 13.6 g/dL (ref 12.0–15.0)
Potassium: 3.5 mEq/L (ref 3.5–5.1)
Sodium: 139 mEq/L (ref 135–145)
TCO2: 23 mmol/L (ref 0–100)

## 2012-02-25 LAB — PREGNANCY, URINE: Preg Test, Ur: NEGATIVE

## 2012-02-25 MED ORDER — HYDROCODONE-ACETAMINOPHEN 5-325 MG PO TABS
1.0000 | ORAL_TABLET | Freq: Once | ORAL | Status: AC
  Administered 2012-02-25: 1 via ORAL
  Filled 2012-02-25: qty 1

## 2012-02-25 MED ORDER — HYDROCODONE-ACETAMINOPHEN 5-500 MG PO TABS: 1.0000 | ORAL_TABLET | Freq: Four times a day (QID) | ORAL | Status: AC | PRN

## 2012-02-25 MED ORDER — METRONIDAZOLE 500 MG PO TABS: 500.0000 mg | ORAL_TABLET | Freq: Two times a day (BID) | ORAL | Status: AC

## 2012-02-25 NOTE — ED Notes (Signed)
Patient states that she has been bleeding for 3 weeks.  Flow is usually heavy but can vary. C/O having pelvic pressure along with this.  States that she has been having this problem for about 3 years.

## 2012-02-25 NOTE — ED Notes (Signed)
Pt presents with 3 week h/o vaginal bleeding.  Pt reports pressure to suprapubic area.  Pt reports fatigue and dizziness.

## 2012-02-25 NOTE — ED Provider Notes (Signed)
History     CSN: [id number]  Arrival date & time 02/25/12  1249   First MD Initiated Contact with Patient 02/25/12 1453      Chief Complaint  Patient presents with  . Vaginal Bleeding    (Consider location/radiation/quality/duration/timing/severity/associated sxs/prior treatment) HPI Comments: Brenda Blankenship is a 26 y.o. Female who presents with complaint of lower abdominal pressure, vaginal bleeding for 3weeks. States she has had problems with pressure and pain in pelvic area on and off for several months. She has been followed by Dr. Tenny Craw, her GYN who has tried her on different OCPs for heavy periods and cramping, and states nothing was helping, so they decided that hysterectomy would be the only option. Pt states she has tried ibuprofen for pain, but it is not helping so she is not taking it any more. Denies nausea, vomiting, fever, chills, urinary symptom. States she is not pregnant.    Past Medical History  Diagnosis Date  . Environmental allergies     History reviewed. No pertinent past surgical history.  No family history on file.  History  Substance Use Topics  . Smoking status: Current Everyday Smoker -- 1.0 packs/day    Types: Cigarettes  . Smokeless tobacco: Never Used  . Alcohol Use: No    OB History    Grav Para Term Preterm Abortions TAB SAB Ect Mult Living                  Review of Systems  Constitutional: Negative for fever and chills.  HENT: Negative for neck pain and neck stiffness.   Respiratory: Negative.   Gastrointestinal: Positive for abdominal pain. Negative for nausea and vomiting.  Genitourinary: Positive for vaginal bleeding and pelvic pain. Negative for dysuria, urgency, vaginal discharge and vaginal pain.  Musculoskeletal: Negative for back pain.  Skin: Negative.   Neurological: Positive for dizziness, light-headedness and headaches. Negative for weakness.    Allergies  Review of patient's allergies indicates no known allergies.  Home  Medications   Current Outpatient Rx  Name Route Sig Dispense Refill  . IBUPROFEN 200 MG PO TABS Oral Take 400 mg by mouth every 6 (six) hours as needed. For pain    . LO LOESTRIN FE PO Oral Take 1 tablet by mouth daily.      BP 141/80  Pulse 57  Temp 98.2 F (36.8 C) (Oral)  Resp 18  Ht 5\' 3"  (1.6 m)  Wt 128 lb (58.06 kg)  BMI 22.67 kg/m2  SpO2 100%  LMP 02/25/2012  Physical Exam  Constitutional: She is oriented to person, place, and time. She appears well-developed and well-nourished. No distress.  Cardiovascular: Normal rate, regular rhythm and normal heart sounds.   Pulmonary/Chest: Effort normal and breath sounds normal. No respiratory distress. She has no wheezes. She has no rales.  Abdominal: Soft. Bowel sounds are normal. She exhibits no distension. There is no rebound and no guarding.       Suprapubic tenderness   Musculoskeletal: Normal range of motion.  Neurological: She is alert and oriented to person, place, and time.  Skin: Skin is warm and dry.  Psychiatric: She has a normal mood and affect.    ED Course  Procedures (including critical care time)  Pt with vaginal bleeding and uterus cramping. Pt is followed by her GYN, but pain worsened today. Exam unremarkable. Labs and h& h pending.   Results for orders placed during the hospital encounter of 02/25/12  URINALYSIS, ROUTINE W REFLEX MICROSCOPIC  Component Value Range   Color, Urine YELLOW  YELLOW   APPearance CLEAR  CLEAR   Specific Gravity, Urine 1.013  1.005 - 1.030   pH 6.5  5.0 - 8.0   Glucose, UA NEGATIVE  NEGATIVE mg/dL   Hgb urine dipstick TRACE (*) NEGATIVE   Bilirubin Urine NEGATIVE  NEGATIVE   Ketones, ur NEGATIVE  NEGATIVE mg/dL   Protein, ur NEGATIVE  NEGATIVE mg/dL   Urobilinogen, UA 0.2  0.0 - 1.0 mg/dL   Nitrite NEGATIVE  NEGATIVE   Leukocytes, UA NEGATIVE  NEGATIVE  PREGNANCY, URINE      Component Value Range   Preg Test, Ur NEGATIVE  NEGATIVE  CBC WITH DIFFERENTIAL       Component Value Range   WBC 4.0  4.0 - 10.5 K/uL   RBC 4.68  3.87 - 5.11 MIL/uL   Hemoglobin 13.0  12.0 - 15.0 g/dL   HCT 84.6  96.2 - 95.2 %   MCV 83.3  78.0 - 100.0 fL   MCH 27.8  26.0 - 34.0 pg   MCHC 33.3  30.0 - 36.0 g/dL   RDW 84.1  32.4 - 40.1 %   Platelets 217  150 - 400 K/uL   Neutrophils Relative 40 (*) 43 - 77 %   Neutro Abs 1.6 (*) 1.7 - 7.7 K/uL   Lymphocytes Relative 50 (*) 12 - 46 %   Lymphs Abs 2.0  0.7 - 4.0 K/uL   Monocytes Relative 9  3 - 12 %   Monocytes Absolute 0.4  0.1 - 1.0 K/uL   Eosinophils Relative 1  0 - 5 %   Eosinophils Absolute 0.1  0.0 - 0.7 K/uL   Basophils Relative 1  0 - 1 %   Basophils Absolute 0.0  0.0 - 0.1 K/uL  WET PREP, GENITAL      Component Value Range   Yeast Wet Prep HPF POC NONE SEEN  NONE SEEN   Trich, Wet Prep NONE SEEN  NONE SEEN   Clue Cells Wet Prep HPF POC FEW (*) NONE SEEN   WBC, Wet Prep HPF POC FEW (*) NONE SEEN  POCT I-STAT, CHEM 8      Component Value Range   Sodium 139  135 - 145 mEq/L   Potassium 3.5  3.5 - 5.1 mEq/L   Chloride 105  96 - 112 mEq/L   BUN 8  6 - 23 mg/dL   Creatinine, Ser 0.27  0.50 - 1.10 mg/dL   Glucose, Bld 77  70 - 99 mg/dL   Calcium, Ion 2.53  6.64 - 1.23 mmol/L   TCO2 23  0 - 100 mmol/L   Hemoglobin 13.6  12.0 - 15.0 g/dL   HCT 40.3  47.4 - 25.9 %  URINE MICROSCOPIC-ADD ON      Component Value Range   Squamous Epithelial / LPF RARE  RARE   WBC, UA 0-2  <3 WBC/hpf   RBC / HPF 0-2  <3 RBC/hpf   Bacteria, UA RARE  RARE   No results found.  5:10 PM Pt's pain improved, she is in NAD, no peritoneal signs. Doubt surgical abdomen. Urine preg test is negative. UA unremarkable. Labs normal including H&H. Will d/c home with follow up with Dr. Tenny Craw. Differentials include fibroids vs endometriosis. Pt stats she has always had cramping with periods and heavy bleeding, even with her pregnancy.   1. Pelvic pain in female   2. Vaginal bleeding   3. Bacterial vaginosis  MDM           Lottie Mussel, PA 02/25/12 1715

## 2012-02-26 LAB — GC/CHLAMYDIA PROBE AMP, GENITAL
Chlamydia, DNA Probe: NEGATIVE
GC Probe Amp, Genital: NEGATIVE

## 2012-02-26 NOTE — ED Provider Notes (Signed)
Medical screening examination/treatment/procedure(s) were performed by non-physician practitioner and as supervising physician I was immediately available for consultation/collaboration.   Dione Booze, MD 02/26/12 [phone]

## 2012-04-09 ENCOUNTER — Encounter (HOSPITAL_COMMUNITY): Payer: Self-pay | Admitting: *Deleted

## 2012-04-09 ENCOUNTER — Emergency Department (HOSPITAL_COMMUNITY): Payer: No Typology Code available for payment source

## 2012-04-09 ENCOUNTER — Emergency Department (HOSPITAL_COMMUNITY)
Admission: EM | Admit: 2012-04-09 | Discharge: 2012-04-09 | Disposition: A | Discharge status: Home / Self Care | Payer: No Typology Code available for payment source | Attending: Emergency Medicine | Admitting: Emergency Medicine

## 2012-04-09 DIAGNOSIS — M25519 Pain in unspecified shoulder: Secondary | ICD-10-CM | POA: Insufficient documentation

## 2012-04-09 DIAGNOSIS — Y93I9 Activity, other involving external motion: Secondary | ICD-10-CM | POA: Insufficient documentation

## 2012-04-09 DIAGNOSIS — S0180XA Unspecified open wound of other part of head, initial encounter: Secondary | ICD-10-CM | POA: Insufficient documentation

## 2012-04-09 DIAGNOSIS — M25511 Pain in right shoulder: Secondary | ICD-10-CM

## 2012-04-09 DIAGNOSIS — M542 Cervicalgia: Secondary | ICD-10-CM | POA: Insufficient documentation

## 2012-04-09 DIAGNOSIS — Y998 Other external cause status: Secondary | ICD-10-CM | POA: Insufficient documentation

## 2012-04-09 MED ORDER — ONDANSETRON HCL 4 MG/2ML IJ SOLN
4.0000 mg | Freq: Once | INTRAMUSCULAR | Status: AC
  Administered 2012-04-09: 4 mg via INTRAVENOUS
  Filled 2012-04-09: qty 2

## 2012-04-09 MED ORDER — HYDROMORPHONE HCL PF 1 MG/ML IJ SOLN
1.0000 mg | Freq: Once | INTRAMUSCULAR | Status: AC
  Administered 2012-04-09: 1 mg via INTRAVENOUS
  Filled 2012-04-09: qty 1

## 2012-04-09 MED ORDER — SODIUM CHLORIDE 0.9 % IV BOLUS (SEPSIS)
1000.0000 mL | Freq: Once | INTRAVENOUS | Status: AC
  Administered 2012-04-09: 1000 mL via INTRAVENOUS
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
## 2012-04-09 NOTE — ED Notes (Signed)
Pt d/c home in NAD. Pt voiced understanding of d/c instructions and follow up care.  

## 2012-04-09 NOTE — ED Notes (Signed)
MD at bedside suturing forehead lac
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
## 2012-04-09 NOTE — ED Notes (Signed)
MD notified of pt pain score, MD currently at bedside
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
## 2012-04-09 NOTE — ED Notes (Signed)
No swelling, contusion or obvious deformity noted to pt right shoulder

## 2012-04-09 NOTE — ED Notes (Signed)
Pt c/o right sided face pain, multiple small lacs noted to right cheek, lack noted to forehead, and pain to right shoulder and collarbone. Pt states pain is also located under right arm. Pt states her lower back is painful as well, denies any neck pain. Pt alert and oriented, denies hitting head.

## 2012-04-10 NOTE — ED Provider Notes (Signed)
I have personally seen and examined the patient.  I have discussed the plan of care with the resident.  I have reviewed the documentation on PMH/FH/Soc. History.  I have reviewed the documentation of the resident and agree.  Damian Buckles, MD 04/10/12 1456 

## 2012-04-12 ENCOUNTER — Emergency Department (HOSPITAL_COMMUNITY)
Admission: EM | Admit: 2012-04-12 | Discharge: 2012-04-12 | Disposition: A | Discharge status: Home / Self Care | Payer: No Typology Code available for payment source | Attending: Emergency Medicine | Admitting: Emergency Medicine

## 2012-04-12 ENCOUNTER — Encounter (HOSPITAL_COMMUNITY): Payer: Self-pay | Admitting: Radiology

## 2012-04-12 ENCOUNTER — Emergency Department (HOSPITAL_COMMUNITY): Payer: No Typology Code available for payment source

## 2012-04-12 DIAGNOSIS — S0003XA Contusion of scalp, initial encounter: Secondary | ICD-10-CM | POA: Insufficient documentation

## 2012-04-12 DIAGNOSIS — R51 Headache: Secondary | ICD-10-CM | POA: Insufficient documentation

## 2012-04-12 DIAGNOSIS — H5789 Other specified disorders of eye and adnexa: Secondary | ICD-10-CM | POA: Insufficient documentation

## 2012-04-12 DIAGNOSIS — Z5189 Encounter for other specified aftercare: Secondary | ICD-10-CM | POA: Insufficient documentation

## 2012-04-12 DIAGNOSIS — S0083XA Contusion of other part of head, initial encounter: Secondary | ICD-10-CM

## 2012-04-12 DIAGNOSIS — S1093XA Contusion of unspecified part of neck, initial encounter: Secondary | ICD-10-CM | POA: Insufficient documentation

## 2012-04-12 DIAGNOSIS — S0990XA Unspecified injury of head, initial encounter: Secondary | ICD-10-CM | POA: Insufficient documentation

## 2012-04-12 MED ORDER — HYDROCODONE-ACETAMINOPHEN 5-325 MG PO TABS
1.0000 | ORAL_TABLET | Freq: Once | ORAL | Status: AC
  Administered 2012-04-12: 1 via ORAL
  Filled 2012-04-12: qty 1

## 2012-04-12 MED ORDER — HYDROCODONE-ACETAMINOPHEN 5-325 MG PO TABS: 1.0000 | ORAL_TABLET | ORAL | Status: DC | PRN

## 2012-04-12 NOTE — ED Notes (Signed)
Patient is resting comfortably. 
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
## 2012-04-12 NOTE — ED Notes (Signed)
Patient stated that her headache is better. 

## 2012-11-03 ENCOUNTER — Encounter (HOSPITAL_COMMUNITY): Payer: Self-pay | Admitting: Emergency Medicine

## 2012-11-03 ENCOUNTER — Emergency Department (HOSPITAL_COMMUNITY): Payer: Self-pay

## 2012-11-03 ENCOUNTER — Emergency Department (HOSPITAL_COMMUNITY)
Admission: EM | Admit: 2012-11-03 | Discharge: 2012-11-03 | Disposition: A | Discharge status: Home / Self Care | Payer: Self-pay | Attending: Emergency Medicine | Admitting: Emergency Medicine

## 2012-11-03 DIAGNOSIS — R42 Dizziness and giddiness: Secondary | ICD-10-CM | POA: Insufficient documentation

## 2012-11-03 DIAGNOSIS — Z3202 Encounter for pregnancy test, result negative: Secondary | ICD-10-CM | POA: Insufficient documentation

## 2012-11-03 DIAGNOSIS — F17200 Nicotine dependence, unspecified, uncomplicated: Secondary | ICD-10-CM | POA: Insufficient documentation

## 2012-11-03 DIAGNOSIS — R110 Nausea: Secondary | ICD-10-CM | POA: Insufficient documentation

## 2012-11-03 DIAGNOSIS — R0789 Other chest pain: Secondary | ICD-10-CM

## 2012-11-03 DIAGNOSIS — R071 Chest pain on breathing: Secondary | ICD-10-CM | POA: Insufficient documentation

## 2012-11-03 LAB — POCT I-STAT TROPONIN I: Troponin i, poc: 0 ng/mL (ref 0.00–0.08)

## 2012-11-03 LAB — CBC WITH DIFFERENTIAL/PLATELET
Basophils Absolute: 0 10*3/uL (ref 0.0–0.1)
Basophils Relative: 1 % (ref 0–1)
Eosinophils Absolute: 0.1 10*3/uL (ref 0.0–0.7)
Eosinophils Relative: 2 % (ref 0–5)
HCT: 37.9 % (ref 36.0–46.0)
Hemoglobin: 12.9 g/dL (ref 12.0–15.0)
Lymphocytes Relative: 60 % — ABNORMAL HIGH (ref 12–46)
Lymphs Abs: 2.6 10*3/uL (ref 0.7–4.0)
MCH: 27.9 pg (ref 26.0–34.0)
MCHC: 34 g/dL (ref 30.0–36.0)
MCV: 81.9 fL (ref 78.0–100.0)
Monocytes Absolute: 0.4 10*3/uL (ref 0.1–1.0)
Monocytes Relative: 10 % (ref 3–12)
Neutro Abs: 1.1 10*3/uL — ABNORMAL LOW (ref 1.7–7.7)
Neutrophils Relative %: 27 % — ABNORMAL LOW (ref 43–77)
Platelets: 197 10*3/uL (ref 150–400)
RBC: 4.63 MIL/uL (ref 3.87–5.11)
RDW: 13.3 % (ref 11.5–15.5)
WBC: 4.2 10*3/uL (ref 4.0–10.5)

## 2012-11-03 LAB — COMPREHENSIVE METABOLIC PANEL
ALT: 9 U/L (ref 0–35)
AST: 15 U/L (ref 0–37)
Albumin: 3.7 g/dL (ref 3.5–5.2)
Alkaline Phosphatase: 43 U/L (ref 39–117)
BUN: 8 mg/dL (ref 6–23)
CO2: 26 mEq/L (ref 19–32)
Calcium: 8.9 mg/dL (ref 8.4–10.5)
Chloride: 103 mEq/L (ref 96–112)
Creatinine, Ser: 0.88 mg/dL (ref 0.50–1.10)
GFR calc Af Amer: >90 mL/min (ref >90)
GFR calc non Af Amer: 90 mL/min — ABNORMAL LOW (ref >90)
Glucose, Bld: 84 mg/dL (ref 70–99)
Potassium: 4.1 mEq/L (ref 3.5–5.1)
Sodium: 137 mEq/L (ref 135–145)
Total Bilirubin: 0.6 mg/dL (ref 0.3–1.2)
Total Protein: 6.7 g/dL (ref 6.0–8.3)

## 2012-11-03 LAB — URINALYSIS, ROUTINE W REFLEX MICROSCOPIC
Bilirubin Urine: NEGATIVE
Glucose, UA: NEGATIVE mg/dL
Ketones, ur: NEGATIVE mg/dL
Leukocytes, UA: NEGATIVE
Nitrite: NEGATIVE
Protein, ur: NEGATIVE mg/dL
Specific Gravity, Urine: 1.012 (ref 1.005–1.030)
Urobilinogen, UA: 0.2 mg/dL (ref 0.0–1.0)
pH: 6 (ref 5.0–8.0)

## 2012-11-03 LAB — URINE MICROSCOPIC-ADD ON

## 2012-11-03 LAB — POCT PREGNANCY, URINE: Preg Test, Ur: NEGATIVE

## 2012-11-03 MED ORDER — SODIUM CHLORIDE 0.9 % IV SOLN
Freq: Once | INTRAVENOUS | Status: AC
  Administered 2012-11-03: 22:00:00 via INTRAVENOUS

## 2012-11-03 MED ORDER — OMEPRAZOLE 20 MG PO CPDR: 20.0000 mg | DELAYED_RELEASE_CAPSULE | Freq: Every day | ORAL | Status: DC

## 2012-11-03 MED ORDER — NAPROXEN 500 MG PO TABS: 500.0000 mg | ORAL_TABLET | Freq: Two times a day (BID) | ORAL | Status: DC

## 2012-11-03 MED ORDER — KETOROLAC TROMETHAMINE 30 MG/ML IJ SOLN
30.0000 mg | Freq: Once | INTRAMUSCULAR | Status: AC
  Administered 2012-11-03: 30 mg via INTRAVENOUS
  Filled 2012-11-03: qty 1
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
## 2012-11-03 NOTE — ED Provider Notes (Signed)
Medical screening examination/treatment/procedure(s) were performed by non-physician practitioner and as supervising physician I was immediately available for consultation/collaboration.  Shakeita Vandevander J. Kelby Lotspeich, MD 11/03/12 2228 

## 2012-11-03 NOTE — ED Provider Notes (Signed)
History     CSN: [id number]  Arrival date & time 11/03/12  4098   First MD Initiated Contact with Patient 11/03/12 1914      Chief Complaint  Patient presents with  . Chest Pain  . Dizziness    (Consider location/radiation/quality/duration/timing/severity/associated sxs/prior treatment) Patient is a 27 y.o. female presenting with chest pain. The history is provided by the patient and medical records. No language interpreter was used.  Chest Pain Pain location:  Substernal area Pain quality: aching and sharp   Pain radiates to:  Does not radiate Pain radiates to the back: no   Pain severity:  Moderate Onset quality:  Gradual Duration:  3 days Timing:  Intermittent Progression:  Waxing and waning Context: breathing and movement   Relieved by:  None tried Worsened by:  Coughing, deep breathing, movement, exertion and certain positions Ineffective treatments:  None tried Associated symptoms: nausea   Associated symptoms: no abdominal pain, no back pain, no cough, no diaphoresis, no fatigue, no fever, no headache, no shortness of breath and not vomiting     EYVETTE CORDON is a 27 y.o. female  with a hx of seasonal allergies presents to the Emergency Department complaining of gradual, intermittent chest pain for 3 days which became constant this morning at 10 AM while she was at work. She locates her pain is substernal and slightly to the left, rated at an 8/10, nonradiating and described as achy and sharp. She states that coughing, laughing and moving makes it worse and lying still makes it better. Today she experienced lightheadedness and nausea with the chest pain while at work. 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 States she has been evaluated for similar chest pain in the past and was told that she had a pulled muscle in her chest.  Patient denies fever, chills, headache, neck pain abdominal  pain, vomiting, diarrhea, weakness, dizziness, syncope, dysuria, hematuria.  Patient endorses significant family stress in the last 3 weeks but denies anxiety or panic attack today.  Past Medical History  Diagnosis Date  . Environmental allergies     History reviewed. No pertinent past surgical history.  No family history on file.  History  Substance Use Topics  . Smoking status: Current Some Day Smoker -- 0.25 packs/day for 5 years    Types: Cigarettes  . Smokeless tobacco: Never Used  . Alcohol Use: No    OB History   Grav Para Term Preterm Abortions TAB SAB Ect Mult Living                  Review of Systems  Constitutional: Negative for fever, diaphoresis, appetite change, fatigue and unexpected weight change.  HENT: Negative for mouth sores and neck stiffness.   Eyes: Negative for visual disturbance.  Respiratory: Negative for cough, chest tightness, shortness of breath and wheezing.   Cardiovascular: Positive for chest pain.  Gastrointestinal: Positive for nausea. Negative for vomiting, abdominal pain, diarrhea and constipation.  Endocrine: Negative for polydipsia, polyphagia and polyuria.  Genitourinary: Negative for dysuria, urgency, frequency and hematuria.  Musculoskeletal: Negative for back pain.  Skin: Negative for rash.  Allergic/Immunologic: Negative for immunocompromised state.  Neurological: Positive for light-headedness. Negative for syncope and headaches.  Hematological: Does not bruise/bleed easily.  Psychiatric/Behavioral: Negative for sleep disturbance. The patient is not nervous/anxious.     Allergies  Hydrocodone  Home Medications  Current Outpatient Rx  Name  Route  Sig  Dispense  Refill  . acetaminophen (TYLENOL) 325 MG tablet   Oral   Take 650 mg by mouth every 6 (six) hours as needed for pain.         . naproxen (NAPROSYN) 500 MG tablet   Oral   Take 1 tablet (500 mg total) by mouth 2 (two) times daily with a meal.   30 tablet    0   . omeprazole (PRILOSEC) 20 MG capsule   Oral   Take 1 capsule (20 mg total) by mouth daily.   30 capsule   0     BP 118/84  Pulse 60  Temp(Src) 98 F (36.7 C) (Oral)  Resp 18  SpO2 100%  LMP 11/03/2012  Physical Exam  Nursing note and vitals reviewed. Constitutional: She is oriented to person, place, and time. She appears well-developed and well-nourished. No distress.  HENT:  Head: Normocephalic and atraumatic.  Right Ear: External ear normal.  Left Ear: External ear normal.  Mouth/Throat: Oropharynx is clear and moist. No oropharyngeal exudate.  Eyes: Conjunctivae and EOM are normal. Pupils are equal, round, and reactive to light. No scleral icterus.  Neck: Normal range of motion and full passive range of motion without pain. Neck supple. No spinous process tenderness and no muscular tenderness present. No rigidity. Normal range of motion present.  Cardiovascular: Normal rate, regular rhythm, normal heart sounds and intact distal pulses.  Exam reveals no gallop and no friction rub.   No murmur heard. Pulmonary/Chest: Effort normal and breath sounds normal. No accessory muscle usage. Not tachypneic. No respiratory distress. She has no decreased breath sounds. She has no wheezes. She has no rhonchi. She has no rales. Chest wall is not dull to percussion. She exhibits tenderness. She exhibits no laceration, no crepitus, no edema, no deformity, no swelling and no retraction. Right breast exhibits no inverted nipple, no mass, no nipple discharge, no skin change and no tenderness. Left breast exhibits no inverted nipple, no mass, no nipple discharge, no skin change and no tenderness.    Abdominal: Soft. Bowel sounds are normal. She exhibits no distension and no mass. There is no tenderness. There is no rebound and no guarding.  Musculoskeletal: Normal range of motion. She exhibits no edema and no tenderness.  Lymphadenopathy:    She has no cervical adenopathy.  Neurological: She  is alert and oriented to person, place, and time. She exhibits normal muscle tone. Coordination normal.  Speech is clear and goal oriented Moves extremities without ataxia  Skin: Skin is warm and dry. No rash noted. She is not diaphoretic. No erythema.  Psychiatric: She has a normal mood and affect.    ED Course  Procedures (including critical care time)  Labs Reviewed  CBC WITH DIFFERENTIAL - Abnormal; Notable for the following:    Neutrophils Relative 27 (*)    Neutro Abs 1.1 (*)    Lymphocytes Relative 60 (*)    All other components within normal limits  COMPREHENSIVE METABOLIC PANEL - Abnormal; Notable for the following:    GFR calc non Af Amer 90 (*)    All other components within normal limits  URINALYSIS, ROUTINE W REFLEX MICROSCOPIC - Abnormal; Notable for the following:    Hgb urine dipstick LARGE (*)    All other components within normal limits  URINE MICROSCOPIC-ADD ON  POCT PREGNANCY, URINE  POCT I-STAT TROPONIN I   Dg Chest 2 View  11/03/2012  *RADIOLOGY REPORT*  Clinical Data: Chest pain  CHEST - 2 VIEW  Comparison: 04/09/2012  Findings: Lungs are clear. No pleural effusion or pneumothorax.  Cardiomediastinal silhouette is within normal limits.  Visualized osseous structures are within normal limits.  IMPRESSION: No evidence of acute cardiopulmonary disease.   Original Report Authenticated By: Charline Bills, M.D.    ECG:  Date: 11/03/2012  Rate: 78  Rhythm: normal sinus rhythm  QRS Axis: normal  Intervals: normal  ST/T Wave abnormalities: normal  Conduction Disutrbances:none  Narrative Interpretation: nonischemic ECG, unchanged from previous  Old EKG Reviewed: unchanged    1. Chest wall pain       MDM  Beckey Rutter presents with reproducible chest wall pain. Patient without personal or family cardiac history.  Likely costochondritis versus muscle strain however will rule out ACS or other pulmonary etiologies such as pneumonia.    UA no evidence of  urinary tract infection, CBC unremarkable, CMP unremarkable, troponin negative, and ECG nonischemic, chest x-ray without evidence of consolidation or pulmonary edema, test negative.  Chest pain is not likely of cardiac or pulmonary etiology d/t presentation, perc negative, VSS, no tracheal deviation, no JVD or new murmur, RRR, breath sounds equal bilaterally, EKG without acute abnormalities, negative troponin, and negative CXR. Pt has been advised start a PPI and return to the ED if CP becomes exertional, associated with diaphoresis or nausea, radiates to left jaw/arm, worsens or becomes concerning in any way. Patient is to be discharged with recommendation to follow up with PCP in regards to today's hospital visit. Pt appears reliable for follow up and is agreeable to discharge. I have also discussed reasons to return immediately to the ER.  Patient expresses understanding and agrees with plan.  Case has been discussed with by Dr. Jaci Carrel who agrees with the above plan to discharge.         Dahlia Client Adarrius Graeff, PA-C 11/03/12 2152  Dierdre Forth, PA-C 11/03/12 2158

## 2012-11-03 NOTE — ED Notes (Signed)
cbg was 110. 

## 2012-11-04 LAB — GLUCOSE, CAPILLARY: Glucose-Capillary: 110 mg/dL — ABNORMAL HIGH (ref 70–99)

## 2012-12-26 ENCOUNTER — Encounter (HOSPITAL_COMMUNITY): Payer: Self-pay | Admitting: *Deleted

## 2012-12-26 ENCOUNTER — Emergency Department (INDEPENDENT_AMBULATORY_CARE_PROVIDER_SITE_OTHER)
Admission: EM | Admit: 2012-12-26 | Discharge: 2012-12-26 | Disposition: A | Discharge status: Home / Self Care | Payer: BC Managed Care – PPO | Source: Home / Self Care | Attending: Family Medicine | Admitting: Family Medicine

## 2012-12-26 DIAGNOSIS — L255 Unspecified contact dermatitis due to plants, except food: Secondary | ICD-10-CM

## 2012-12-26 DIAGNOSIS — L237 Allergic contact dermatitis due to plants, except food: Secondary | ICD-10-CM

## 2012-12-26 DIAGNOSIS — Z331 Pregnant state, incidental: Secondary | ICD-10-CM

## 2012-12-26 LAB — POCT PREGNANCY, URINE: Preg Test, Ur: POSITIVE — AB

## 2012-12-26 MED ORDER — MOMETASONE FUROATE 0.1 % EX CREA: TOPICAL_CREAM | Freq: Every day | CUTANEOUS | Status: DC

## 2012-12-26 MED ORDER — ZANFEL EX MISC: 1.0000 "application " | Freq: Two times a day (BID) | CUTANEOUS | Status: DC

## 2012-12-26 NOTE — ED Notes (Signed)
Pt  Reports   A  Rash  That  She  Has  Had     X  4  Days   She  Reports  It  Itches    - the  Rash is  generalised     And  Fine   -  She  Displays    No  Angioedema     She  Is  Sitting  Upright on  The  Exam table  And  Is  In no  Acute  Distress

## 2012-12-26 NOTE — ED Provider Notes (Signed)
History     CSN: [id number]  Arrival date & time 12/26/12  1353   First MD Initiated Contact with Patient 12/26/12 1552      Chief Complaint  Patient presents with  . Rash    (Consider location/radiation/quality/duration/timing/severity/associated sxs/prior treatment) Patient is a 27 y.o. female presenting with rash. The history is provided by the patient.  Rash Duration:  4 days Chronicity:  New Context comment:  Onset while in [place] last week. Associated symptoms: no fever     Past Medical History  Diagnosis Date  . Environmental allergies     History reviewed. No pertinent past surgical history.  No family history on file.  History  Substance Use Topics  . Smoking status: Current Some Day Smoker -- 0.25 packs/day for 5 years    Types: Cigarettes  . Smokeless tobacco: Never Used  . Alcohol Use: No    OB History   Grav Para Term Preterm Abortions TAB SAB Ect Mult Living                  Review of Systems  Constitutional: Negative.  Negative for fever.  Skin: Positive for rash.    Allergies  Hydrocodone  Home Medications   Current Outpatient Rx  Name  Route  Sig  Dispense  Refill  . diphenhydrAMINE (BENADRYL) 25 mg capsule   Oral   Take 25 mg by mouth every 6 (six) hours as needed for itching.         Marland Kitchen acetaminophen (TYLENOL) 325 MG tablet   Oral   Take 650 mg by mouth every 6 (six) hours as needed for pain.         . mometasone (ELOCON) 0.1 % cream   Topical   Apply topically daily.   50 g   0   . naproxen (NAPROSYN) 500 MG tablet   Oral   Take 1 tablet (500 mg total) by mouth 2 (two) times daily with a meal.   30 tablet   0   . omeprazole (PRILOSEC) 20 MG capsule   Oral   Take 1 capsule (20 mg total) by mouth daily.   30 capsule   0   . Poison Ivy Treatments (ZANFEL) MISC   Apply externally   Apply 1 application topically 2 (two) times daily.   30 g   1     BP 120/84  Pulse 71  Temp(Src) 99.2 F (37.3 C) (Temporal)   Resp 17  SpO2 100%  LMP 11/24/2012  Physical Exam  Nursing note and vitals reviewed. Constitutional: She appears well-developed and well-nourished.  Skin: Skin is warm. Rash noted. She is diaphoretic.  Patchy papuloves pruitic rash on neck and ext.    ED Course  Procedures (including critical care time)  Labs Reviewed  POCT PREGNANCY, URINE - Abnormal; Notable for the following:    Preg Test, Ur POSITIVE (*)    All other components within normal limits   No results found.   1. Contact dermatitis due to poison ivy   2. Pregnancy as incidental finding       MDM  lmp in early may, sl spotting this mo, concern about preg.        Linna Hoff, MD 12/26/12 [phone]

## 2013-01-09 ENCOUNTER — Inpatient Hospital Stay (HOSPITAL_COMMUNITY)
Admission: AD | Admit: 2013-01-09 | Discharge: 2013-01-09 | Disposition: A | Discharge status: Home / Self Care | Payer: BC Managed Care – PPO | Source: Ambulatory Visit | Attending: Obstetrics and Gynecology | Admitting: Obstetrics and Gynecology

## 2013-01-09 ENCOUNTER — Inpatient Hospital Stay (HOSPITAL_COMMUNITY): Payer: BC Managed Care – PPO

## 2013-01-09 ENCOUNTER — Encounter (HOSPITAL_COMMUNITY): Payer: Self-pay | Admitting: *Deleted

## 2013-01-09 DIAGNOSIS — O209 Hemorrhage in early pregnancy, unspecified: Secondary | ICD-10-CM | POA: Insufficient documentation

## 2013-01-09 DIAGNOSIS — R109 Unspecified abdominal pain: Secondary | ICD-10-CM | POA: Insufficient documentation

## 2013-01-09 DIAGNOSIS — B9689 Other specified bacterial agents as the cause of diseases classified elsewhere: Secondary | ICD-10-CM | POA: Insufficient documentation

## 2013-01-09 DIAGNOSIS — A499 Bacterial infection, unspecified: Secondary | ICD-10-CM | POA: Insufficient documentation

## 2013-01-09 DIAGNOSIS — O9989 Other specified diseases and conditions complicating pregnancy, childbirth and the puerperium: Secondary | ICD-10-CM

## 2013-01-09 DIAGNOSIS — O2390 Unspecified genitourinary tract infection in pregnancy, unspecified trimester: Secondary | ICD-10-CM | POA: Insufficient documentation

## 2013-01-09 DIAGNOSIS — O200 Threatened abortion: Secondary | ICD-10-CM

## 2013-01-09 DIAGNOSIS — N760 Acute vaginitis: Secondary | ICD-10-CM | POA: Insufficient documentation

## 2013-01-09 LAB — CBC
HCT: 33.8 % — ABNORMAL LOW (ref 36.0–46.0)
Hemoglobin: 11.5 g/dL — ABNORMAL LOW (ref 12.0–15.0)
MCH: 27.5 pg (ref 26.0–34.0)
MCHC: 34 g/dL (ref 30.0–36.0)
MCV: 80.9 fL (ref 78.0–100.0)
Platelets: 188 10*3/uL (ref 150–400)
RBC: 4.18 MIL/uL (ref 3.87–5.11)
RDW: 12.7 % (ref 11.5–15.5)
WBC: 4.8 10*3/uL (ref 4.0–10.5)

## 2013-01-09 LAB — WET PREP, GENITAL
Trich, Wet Prep: NONE SEEN
Yeast Wet Prep HPF POC: NONE SEEN

## 2013-01-09 LAB — ABO/RH: ABO/RH(D): O POS

## 2013-01-09 LAB — HCG, QUANTITATIVE, PREGNANCY: hCG, Beta Chain, Quant, S: 37611 m[IU]/mL — ABNORMAL HIGH (ref <5)

## 2013-01-09 MED ORDER — METRONIDAZOLE 500 MG PO TABS: 500.0000 mg | ORAL_TABLET | Freq: Two times a day (BID) | ORAL | Status: DC

## 2013-01-09 NOTE — MAU Provider Note (Signed)
History     CSN: [id number]  Arrival date and time: 01/09/13 1140   None     Chief Complaint  Patient presents with  . Vaginal Bleeding  . Threatened Miscarriage   HPI 27 y.o. G2P1001 at [redacted]w[redacted]d with bleeding and cramping. Bleeding started about 2 days ago, very light, heavier today. + cramping starting this morning. Intercourse yesterday.    Past Medical History  Diagnosis Date  . Environmental allergies     Past Surgical History  Procedure Laterality Date  . Cryotherapy      History reviewed. No pertinent family history.  History  Substance Use Topics  . Smoking status: Former Smoker -- 0.25 packs/day for 5 years    Types: Cigarettes  . Smokeless tobacco: Never Used  . Alcohol Use: No    Allergies:  Allergies  Allergen Reactions  . Hydrocodone     Fainting     No prescriptions prior to admission    Review of Systems  Constitutional: Negative.   Respiratory: Negative.   Cardiovascular: Negative.   Gastrointestinal: Negative for nausea, vomiting, abdominal pain, diarrhea and constipation.  Genitourinary: Negative for dysuria, urgency, frequency, hematuria and flank pain.       + vaginal bleeding and cramping, negative for discharge  Musculoskeletal: Negative.   Neurological: Negative.   Psychiatric/Behavioral: Negative.    Physical Exam   Blood pressure 116/65, pulse 55, temperature 98.8 F (37.1 C), temperature source Oral, resp. rate 16, last menstrual period 11/24/2012.  Physical Exam  Nursing note and vitals reviewed. Constitutional: She is oriented to person, place, and time. She appears well-developed and well-nourished. No distress.  HENT:  Head: Normocephalic and atraumatic.  Cardiovascular: Normal rate and regular rhythm.   Respiratory: Effort normal. No respiratory distress.  GI: Soft. She exhibits no distension and no mass. There is no tenderness. There is no rebound and no guarding.  Genitourinary: There is no rash or lesion on the  right labia. There is no rash or lesion on the left labia. Uterus is tender. Uterus is not deviated, not enlarged and not fixed. Cervix exhibits motion tenderness. Cervix exhibits no discharge and no friability. Right adnexum displays no mass, no tenderness and no fullness. Left adnexum displays tenderness. Left adnexum displays no mass and no fullness. No erythema, tenderness or bleeding (scant pink on vaginal swab, no active bleeding, no blood on endocervical swab) around the vagina. No vaginal discharge found.  Neurological: She is alert and oriented to person, place, and time.  Skin: Skin is warm and dry.  Psychiatric: She has a normal mood and affect.    MAU Course  Procedures Results for orders placed during the hospital encounter of 01/09/13 (from the past 24 hour(s))  CBC     Status: Abnormal   Collection Time    01/09/13 12:07 PM      Result Value Range   WBC 4.8  4.0 - 10.5 K/uL   RBC 4.18  3.87 - 5.11 MIL/uL   Hemoglobin 11.5 (*) 12.0 - 15.0 g/dL   HCT 36.6 (*) 44.0 - 34.7 %   MCV 80.9  78.0 - 100.0 fL   MCH 27.5  26.0 - 34.0 pg   MCHC 34.0  30.0 - 36.0 g/dL   RDW 42.5  95.6 - 38.7 %   Platelets 188  150 - 400 K/uL  ABO/RH     Status: None   Collection Time    01/09/13 12:09 PM      Result Value Range  ABO/RH(D) O POS    HCG, QUANTITATIVE, PREGNANCY     Status: Abnormal   Collection Time    01/09/13 12:28 PM      Result Value Range   hCG, Beta Chain, [place] [address] (*) <5 mIU/mL  WET PREP, GENITAL     Status: Abnormal   Collection Time    01/09/13 12:45 PM      Result Value Range   Yeast Wet Prep HPF POC NONE SEEN  NONE SEEN   Trich, Wet Prep NONE SEEN  NONE SEEN   Clue Cells Wet Prep HPF POC FEW (*) NONE SEEN   WBC, Wet Prep HPF POC FEW (*) NONE SEEN   U/S: + FHR, dating c/w LMP  Assessment and Plan   27 y.o. G2P1001 at [redacted]w[redacted]d with postcoital bleeding and cramping, BV  Rx flagyl as below, precautions rev'd, f/u for care with Dr. Tenny Craw as planned     Medication List    TAKE these medications       flintstones complete 60 MG chewable tablet  Chew 2 tablets by mouth daily.     metroNIDAZOLE 500 MG tablet  Commonly known as:  FLAGYL  Take 1 tablet (500 mg total) by mouth 2 (two) times daily.            Follow-up Information   Schedule an appointment as soon as possible for a visit with Almon Hercules., MD.   Contact information:   [address] [place] [address] [phone]         [facility] 01/09/2013, 3:18 PM

## 2013-01-09 NOTE — MAU Note (Signed)
Saw some light pink in urine on Sat night. Spotted off and on light pink yesterday.  Had some cramping.  This morning, started bleeding heavy and having horrible cramping. Has slowed a little.  Feeling light headed. Found out preg a couple wks ago.

## 2013-01-10 LAB — GC/CHLAMYDIA PROBE AMP
CT Probe RNA: NEGATIVE
GC Probe RNA: NEGATIVE

## 2013-03-22 ENCOUNTER — Encounter (HOSPITAL_COMMUNITY): Payer: Self-pay | Admitting: *Deleted

## 2013-03-22 ENCOUNTER — Inpatient Hospital Stay (HOSPITAL_COMMUNITY)
Admission: AD | Admit: 2013-03-22 | Discharge: 2013-03-22 | Disposition: A | Discharge status: Home / Self Care | Payer: BC Managed Care – PPO | Source: Ambulatory Visit | Attending: Obstetrics and Gynecology | Admitting: Obstetrics and Gynecology

## 2013-03-22 DIAGNOSIS — H8110 Benign paroxysmal vertigo, unspecified ear: Secondary | ICD-10-CM

## 2013-03-22 DIAGNOSIS — E860 Dehydration: Secondary | ICD-10-CM | POA: Insufficient documentation

## 2013-03-22 DIAGNOSIS — O2650 Maternal hypotension syndrome, unspecified trimester: Secondary | ICD-10-CM | POA: Insufficient documentation

## 2013-03-22 DIAGNOSIS — R109 Unspecified abdominal pain: Secondary | ICD-10-CM | POA: Insufficient documentation

## 2013-03-22 LAB — URINALYSIS, ROUTINE W REFLEX MICROSCOPIC
Bilirubin Urine: NEGATIVE
Glucose, UA: NEGATIVE mg/dL
Hgb urine dipstick: NEGATIVE
Ketones, ur: 40 mg/dL — AB
Leukocytes, UA: NEGATIVE
Nitrite: NEGATIVE
Protein, ur: NEGATIVE mg/dL
Specific Gravity, Urine: 1.015 (ref 1.005–1.030)
Urobilinogen, UA: 0.2 mg/dL (ref 0.0–1.0)
pH: 7 (ref 5.0–8.0)

## 2013-03-22 MED ORDER — ACETAMINOPHEN 500 MG PO TABS
500.0000 mg | ORAL_TABLET | Freq: Once | ORAL | Status: AC
  Administered 2013-03-22: 500 mg via ORAL
  Filled 2013-03-22: qty 1

## 2013-03-22 MED ORDER — PROMETHAZINE HCL 25 MG/ML IJ SOLN
Freq: Once | INTRAVENOUS | Status: AC
  Administered 2013-03-22: 16:00:00 via INTRAVENOUS
  Filled 2013-03-22: qty 1000

## 2013-03-22 NOTE — MAU Note (Signed)
Patient states she had dizziness then saw spots and feels like she passed out, woke up on the floor but not sure is she hit anything. States she is under stress and recently had a death in the family. States she has been having some abdominal cramping. Denies bleeding or discharge. Has nausea, some vomiting.

## 2013-03-22 NOTE — MAU Provider Note (Signed)
History     CSN: [id number]  Arrival date and time: 03/22/13 1252   None     Chief Complaint  Patient presents with  . Dizziness  . Chest Pain  . Abdominal Cramping  . Near Syncope   HPI Comments: CATRICIA Blankenship 27 y.o. G2P1001 who is a patient of Dr Waynard Reeds who is 16 weeks and 6 days. She became dizzy and fainted at work today and feels uterine cramping. She admits she has been overworking, under eating and not drinking well due to grandmother recent illness and death.        Patient is a 27 y.o. female presenting with chest pain and cramps.  Chest Pain    Abdominal Cramping      Past Medical History  Diagnosis Date  . Environmental allergies   . Panic attacks     Past Surgical History  Procedure Laterality Date  . Cryotherapy      History reviewed. No pertinent family history.  History  Substance Use Topics  . Smoking status: Former Smoker -- 0.25 packs/day for 5 years    Types: Cigarettes  . Smokeless tobacco: Never Used  . Alcohol Use: No    Allergies:  Allergies  Allergen Reactions  . Hydrocodone     Fainting     Prescriptions prior to admission  Medication Sig Dispense Refill  . acetaminophen (TYLENOL) 325 MG tablet Take 650 mg by mouth every 6 (six) hours as needed for pain.        Review of Systems  Constitutional: Negative.   Respiratory: Negative.   Cardiovascular: Positive for chest pain.  Gastrointestinal: Negative.   Genitourinary: Negative.   Musculoskeletal: Negative.   Neurological:       Complains of headache  Psychiatric/Behavioral: The patient is nervous/anxious.    Physical Exam   Blood pressure 110/63, pulse 60, temperature 98.6 F (37 C), temperature source Oral, resp. rate 16, height 5' 4.8" (1.646 m), weight 136 lb 9.6 oz (61.961 kg), last menstrual period 11/24/2012, SpO2 100.00%.  Physical Exam  Constitutional: She is oriented to person, place, and time. She appears well-developed and well-nourished. No  distress.  HENT:  Head: Normocephalic and atraumatic.  Eyes: Pupils are equal, round, and reactive to light.  Cardiovascular: Normal rate, regular rhythm and normal heart sounds.  Exam reveals no gallop.   No murmur heard. Respiratory: Effort normal and breath sounds normal. No respiratory distress. She has no wheezes. She has no rales. She exhibits no tenderness.  GI: Soft. Bowel sounds are normal. She exhibits no distension and no mass. There is no tenderness. There is no rebound and no guarding.  Musculoskeletal: She exhibits edema.  Neurological: She is alert and oriented to person, place, and time.  Skin: Skin is warm and dry.  Psychiatric: She has a normal mood and affect.   Results for orders placed during the hospital encounter of 03/22/13 (from the past 24 hour(s))  URINALYSIS, ROUTINE W REFLEX MICROSCOPIC     Status: Abnormal   Collection Time    03/22/13  1:30 PM      Result Value Range   Color, Urine YELLOW  YELLOW   APPearance CLEAR  CLEAR   Specific Gravity, Urine 1.015  1.005 - 1.030   pH 7.0  5.0 - 8.0   Glucose, UA NEGATIVE  NEGATIVE mg/dL   Hgb urine dipstick NEGATIVE  NEGATIVE   Bilirubin Urine NEGATIVE  NEGATIVE   Ketones, ur 40 (*) NEGATIVE mg/dL   Protein, ur  NEGATIVE  NEGATIVE mg/dL   Urobilinogen, UA 0.2  0.0 - 1.0 mg/dL   Nitrite NEGATIVE  NEGATIVE   Leukocytes, UA NEGATIVE  NEGATIVE   + FHT   MAU Course  Procedures  MDM: IVF LR with 25 mg phenergan, tylenol Spoke with Dr Dareen Piano who was in agreement with plan  Assessment and Plan  A: Dehydration/ vertigo with fainting episode in pregnancy  P: 1 liter LR with phenergan 25 mg Tylenol i tab for headache Advised to stay hydrated, eat 6 small meals, rest  Brenda Blankenship 03/22/2013, 2:46 PM

## 2013-05-29 ENCOUNTER — Encounter (HOSPITAL_COMMUNITY): Payer: Self-pay | Admitting: *Deleted

## 2013-05-29 ENCOUNTER — Inpatient Hospital Stay (HOSPITAL_COMMUNITY)
Admission: AD | Admit: 2013-05-29 | Discharge: 2013-05-29 | Disposition: A | Discharge status: Home / Self Care | Payer: BC Managed Care – PPO | Source: Ambulatory Visit | Attending: Obstetrics and Gynecology | Admitting: Obstetrics and Gynecology

## 2013-05-29 DIAGNOSIS — J1189 Influenza due to unidentified influenza virus with other manifestations: Secondary | ICD-10-CM

## 2013-05-29 DIAGNOSIS — J112 Influenza due to unidentified influenza virus with gastrointestinal manifestations: Secondary | ICD-10-CM

## 2013-05-29 DIAGNOSIS — M545 Low back pain, unspecified: Secondary | ICD-10-CM | POA: Diagnosis not present

## 2013-05-29 DIAGNOSIS — R197 Diarrhea, unspecified: Secondary | ICD-10-CM | POA: Insufficient documentation

## 2013-05-29 DIAGNOSIS — R109 Unspecified abdominal pain: Secondary | ICD-10-CM | POA: Diagnosis not present

## 2013-05-29 DIAGNOSIS — B9789 Other viral agents as the cause of diseases classified elsewhere: Secondary | ICD-10-CM | POA: Diagnosis not present

## 2013-05-29 DIAGNOSIS — R112 Nausea with vomiting, unspecified: Secondary | ICD-10-CM | POA: Diagnosis present

## 2013-05-29 DIAGNOSIS — E860 Dehydration: Secondary | ICD-10-CM | POA: Insufficient documentation

## 2013-05-29 DIAGNOSIS — Z3490 Encounter for supervision of normal pregnancy, unspecified, unspecified trimester: Secondary | ICD-10-CM

## 2013-05-29 LAB — URINALYSIS, ROUTINE W REFLEX MICROSCOPIC
Bilirubin Urine: NEGATIVE
Glucose, UA: NEGATIVE mg/dL
Glucose, UA: 500 mg/dL — AB
Hgb urine dipstick: NEGATIVE
Hgb urine dipstick: NEGATIVE
Ketones, ur: >80 mg/dL — AB
Ketones, ur: 15 mg/dL — AB
Leukocytes, UA: NEGATIVE
Nitrite: NEGATIVE
Nitrite: NEGATIVE
Protein, ur: 100 mg/dL — AB
Protein, ur: NEGATIVE mg/dL
Specific Gravity, Urine: >1.03 — ABNORMAL HIGH (ref 1.005–1.030)
Specific Gravity, Urine: 1.01 (ref 1.005–1.030)
Urobilinogen, UA: 0.2 mg/dL (ref 0.0–1.0)
Urobilinogen, UA: 0.2 mg/dL (ref 0.0–1.0)
pH: 6 (ref 5.0–8.0)
pH: 7 (ref 5.0–8.0)

## 2013-05-29 LAB — URINE MICROSCOPIC-ADD ON

## 2013-05-29 MED ORDER — METOCLOPRAMIDE HCL 5 MG/ML IJ SOLN
10.0000 mg | Freq: Once | INTRAMUSCULAR | Status: AC
  Administered 2013-05-29: 10 mg via INTRAVENOUS
  Filled 2013-05-29: qty 2

## 2013-05-29 MED ORDER — ONDANSETRON 8 MG PO TBDP
8.0000 mg | ORAL_TABLET | Freq: Once | ORAL | Status: AC
  Administered 2013-05-29: 8 mg via ORAL
  Filled 2013-05-29: qty 1

## 2013-05-29 MED ORDER — DEXTROSE 5 % IN LACTATED RINGERS IV BOLUS
1000.0000 mL | INTRAVENOUS | Status: AC
  Administered 2013-05-29: 1000 mL via INTRAVENOUS

## 2013-05-29 MED ORDER — LACTATED RINGERS IV SOLN
Freq: Once | INTRAVENOUS | Status: AC
  Administered 2013-05-29: 14:00:00 via INTRAVENOUS
  Filled 2013-05-29: qty 1000

## 2013-05-29 MED ORDER — PROMETHAZINE HCL 25 MG/ML IJ SOLN
INTRAVENOUS | Status: DC
  Administered 2013-05-29: 11:00:00 via INTRAVENOUS
  Filled 2013-05-29 (×10): qty 1000

## 2013-05-29 NOTE — MAU Provider Note (Signed)
History     CSN: [id number]  Arrival date and time: 05/29/13 4098   First Provider Initiated Contact with Patient 05/29/13 [phone]      Chief Complaint  Patient presents with  . Emesis    vomiting, diarrhea, cold sweats    HPI Comments: Brenda Blankenship 27 y.o. G2P1001 presents with complaints of nausea, vomiting, and diarrhea since Friday.  The symptoms progressed over the weekend.  Last night she reports vomiting 5 times and having 2 bouts of diarrhea.  She has not been able to keep any solids or liquids down.  She has had intermittent chills and sweating, but has not checked her temperature.  She is dizzy and lightheaded but has had no presyncope episodes.  She has lower back pain and cramping with vomiting.  Denies other pain.  Fetal movement has been normal.  She works in [employer] and reports that two of her co-workers were out sick this past week.     Emesis  Associated symptoms include chills, diarrhea and dizziness.      Past Medical History  Diagnosis Date  . Environmental allergies   . Panic attacks     Past Surgical History  Procedure Laterality Date  . Cryotherapy      History reviewed. No pertinent family history.  History  Substance Use Topics  . Smoking status: Former Smoker -- 0.25 packs/day for 5 years    Types: Cigarettes  . Smokeless tobacco: Never Used  . Alcohol Use: No    Allergies:  Allergies  Allergen Reactions  . Hydrocodone     Fainting, lightheadness    Prescriptions prior to admission  Medication Sig Dispense Refill  . flintstones complete (FLINTSTONES) 60 MG chewable tablet Chew 2 tablets by mouth daily.        Review of Systems  Constitutional: Positive for chills and diaphoresis.  HENT: Negative.   Eyes: Negative.   Cardiovascular: Negative.   Gastrointestinal: Positive for vomiting and diarrhea.  Genitourinary:       Decreased frequency and urine is darker in color  Musculoskeletal: Positive for back pain (lower  backache with vomiting).  Skin: Negative.   Neurological: Positive for dizziness.       Lightheaded  Endo/Heme/Allergies: Negative.   Psychiatric/Behavioral: Negative.    Physical Exam   Blood pressure 125/59, pulse 100, temperature 97.6 F (36.4 C), temperature source Oral, resp. rate 16, height 5\' 3"  (1.6 m), weight 68.55 kg (151 lb 2 oz), last menstrual period 11/24/2012.  Physical Exam  Constitutional: She appears well-developed and well-nourished. No distress.  HENT:  Head: Normocephalic and atraumatic.  Eyes: Pupils are equal, round, and reactive to light.  Cardiovascular: Normal rate, regular rhythm and normal heart sounds.   Respiratory: Effort normal and breath sounds normal. No respiratory distress.  GI: Soft. Bowel sounds are normal.  Skin: She is not diaphoretic.   Results for orders placed during the hospital encounter of 05/29/13 (from the past 24 hour(s))  URINALYSIS, ROUTINE W REFLEX MICROSCOPIC     Status: Abnormal   Collection Time    05/29/13  8:45 AM      Result Value Range   Color, Urine YELLOW  YELLOW   APPearance CLEAR  CLEAR   Specific Gravity, Urine >1.030 (*) 1.005 - 1.030   pH 6.0  5.0 - 8.0   Glucose, UA NEGATIVE  NEGATIVE mg/dL   Hgb urine dipstick NEGATIVE  NEGATIVE   Bilirubin Urine MODERATE (*) NEGATIVE   Ketones, ur >80 (*) NEGATIVE  mg/dL   Protein, ur 213 (*) NEGATIVE mg/dL   Urobilinogen, UA 0.2  0.0 - 1.0 mg/dL   Nitrite NEGATIVE  NEGATIVE   Leukocytes, UA NEGATIVE  NEGATIVE  URINE MICROSCOPIC-ADD ON     Status: Abnormal   Collection Time    05/29/13  8:45 AM      Result Value Range   Squamous Epithelial / LPF MANY (*) RARE   WBC, UA 0-2  <3 WBC/hpf   RBC / HPF 0-2  <3 RBC/hpf   Bacteria, UA RARE  RARE   Urine-Other MUCOUS PRESENT    URINALYSIS, ROUTINE W REFLEX MICROSCOPIC     Status: Abnormal   Collection Time    05/29/13  4:10 PM      Result Value Range   Color, Urine YELLOW  YELLOW   APPearance HAZY (*) CLEAR   Specific  Gravity, Urine 1.010  1.005 - 1.030   pH 7.0  5.0 - 8.0   Glucose, UA 500 (*) NEGATIVE mg/dL   Hgb urine dipstick NEGATIVE  NEGATIVE   Bilirubin Urine NEGATIVE  NEGATIVE   Ketones, ur 15 (*) NEGATIVE mg/dL   Protein, ur NEGATIVE  NEGATIVE mg/dL   Urobilinogen, UA 0.2  0.0 - 1.0 mg/dL   Nitrite NEGATIVE  NEGATIVE   Leukocytes, UA SMALL (*) NEGATIVE  URINE MICROSCOPIC-ADD ON     Status: Abnormal   Collection Time    05/29/13  4:10 PM      Result Value Range   Squamous Epithelial / LPF MANY (*) RARE   WBC, UA 3-6  <3 WBC/hpf    MAU Course  Procedures  MDM  > 80 ketones: 3 liters administered- LR with phenergan 25 mg, LR with MVI, D5 Nausea: zofran 8 mg PO and reglan 10 mg IV Notified Dr. Tenny Craw of observation and POC Will collect urine to follow hydration status and consider additional fluids if not improved.   Assessment and Plan   A: GI Virus with dehydration  P: 3 Liters IVF with phenergan, reglan and zofran Pt given option of admission or home/ she wants to go home Has phenergan at home Follow up with Dr Kirtland Bouchard. [name] 05/29/2013, 5:08 PM

## 2013-05-29 NOTE — MAU Note (Signed)
Patient reports to MAU with vomiting, diarrhea, cold sweats, and lower back pains.

## 2013-05-29 NOTE — MAU Note (Signed)
Patient reports really bad cold chills and feeling lightheaded.

## 2013-05-29 NOTE — MAU Note (Signed)
Pt resting.

## 2013-07-10 ENCOUNTER — Inpatient Hospital Stay (HOSPITAL_COMMUNITY)
Admission: AD | Admit: 2013-07-10 | Discharge: 2013-07-10 | Disposition: A | Discharge status: Home / Self Care | Payer: BC Managed Care – PPO | Source: Ambulatory Visit | Attending: Obstetrics and Gynecology | Admitting: Obstetrics and Gynecology

## 2013-07-10 ENCOUNTER — Encounter (HOSPITAL_COMMUNITY): Payer: Self-pay | Admitting: General Practice

## 2013-07-10 DIAGNOSIS — O4703 False labor before 37 completed weeks of gestation, third trimester: Secondary | ICD-10-CM

## 2013-07-10 DIAGNOSIS — O4700 False labor before 37 completed weeks of gestation, unspecified trimester: Secondary | ICD-10-CM | POA: Insufficient documentation

## 2013-07-10 DIAGNOSIS — O479 False labor, unspecified: Secondary | ICD-10-CM

## 2013-07-10 DIAGNOSIS — M545 Low back pain, unspecified: Secondary | ICD-10-CM | POA: Insufficient documentation

## 2013-07-10 DIAGNOSIS — R109 Unspecified abdominal pain: Secondary | ICD-10-CM | POA: Insufficient documentation

## 2013-07-10 LAB — URINALYSIS, ROUTINE W REFLEX MICROSCOPIC
Bilirubin Urine: NEGATIVE
Glucose, UA: NEGATIVE mg/dL
Hgb urine dipstick: NEGATIVE
Ketones, ur: NEGATIVE mg/dL
Leukocytes, UA: NEGATIVE
Nitrite: NEGATIVE
Protein, ur: NEGATIVE mg/dL
Specific Gravity, Urine: 1.01 (ref 1.005–1.030)
Urobilinogen, UA: 0.2 mg/dL (ref 0.0–1.0)
pH: 7 (ref 5.0–8.0)

## 2013-07-10 MED ORDER — LACTATED RINGERS IV SOLN
Freq: Once | INTRAVENOUS | Status: AC
  Administered 2013-07-10: 19:00:00 via INTRAVENOUS

## 2013-07-10 MED ORDER — TERBUTALINE SULFATE 1 MG/ML IJ SOLN
0.2500 mg | INTRAMUSCULAR | Status: AC
  Administered 2013-07-10: 0.25 mg via SUBCUTANEOUS
  Filled 2013-07-10: qty 1

## 2013-07-10 MED ORDER — NIFEDIPINE 10 MG PO CAPS: 10.0000 mg | ORAL_CAPSULE | Freq: Four times a day (QID) | ORAL | Status: DC | PRN

## 2013-07-10 MED ORDER — BETAMETHASONE SOD PHOS & ACET 6 (3-3) MG/ML IJ SUSP
12.0000 mg | Freq: Once | INTRAMUSCULAR | Status: AC
  Administered 2013-07-10: 12 mg via INTRAMUSCULAR
  Filled 2013-07-10: qty 2

## 2013-07-10 NOTE — MAU Provider Note (Signed)
History     CSN: [id number]  Arrival date and time: 07/10/13 1644   First Provider Initiated Contact with Patient 07/10/13 1747      Chief Complaint  Patient presents with  . Abdominal Cramping   HPI Brenda Blankenship 27 y.o. [redacted]w[redacted]d  Sent over to MAU from the office today.  Was checked in the office and is 1 cm.  Lost her mucus plug last night.  Has been having periodic contractions and low back pain and pressure since 3 am today.  Has continued all day and client is worried.  Denies any previous preterm births.  OB History   Grav Para Term Preterm Abortions TAB SAB Ect Mult Living   2 1 1  0 0 0 0 0 0 1      Past Medical History  Diagnosis Date  . Environmental allergies   . Panic attacks     Past Surgical History  Procedure Laterality Date  . Cryotherapy      History reviewed. No pertinent family history.  History  Substance Use Topics  . Smoking status: Former Smoker -- 0.25 packs/day for 5 years    Types: Cigarettes  . Smokeless tobacco: Never Used  . Alcohol Use: No    Allergies:  Allergies  Allergen Reactions  . Hydrocodone     Fainting, lightheadness    Prescriptions prior to admission  Medication Sig Dispense Refill  . acetaminophen (TYLENOL) 325 MG tablet Take 325-650 mg by mouth every 6 (six) hours as needed for mild pain or headache.      . Doxylamine-Pyridoxine (DICLEGIS) 10-10 MG TBEC Take 1 tablet by mouth 3 (three) times daily as needed (For nausea.).      Marland Kitchen flintstones complete (FLINTSTONES) 60 MG chewable tablet Chew 2 tablets by mouth daily.      . promethazine (PHENERGAN) 25 MG tablet Take 25 mg by mouth every 8 (eight) hours as needed for nausea or vomiting.        Review of Systems  Constitutional: Negative for fever.  Gastrointestinal: Positive for abdominal pain. Negative for nausea and vomiting.  Genitourinary: Negative for dysuria.       Loss of mucus plug today Pelvic pressure No leaking of fluid  Musculoskeletal: Positive for  back pain.   Physical Exam   Blood pressure 133/88, pulse 91, temperature 98.3 F (36.8 C), temperature source Oral, resp. rate 18, height 5\' 3"  (1.6 m), weight 156 lb (70.761 kg), last menstrual period 11/24/2012.  Physical Exam  Nursing note and vitals reviewed. Constitutional: She is oriented to person, place, and time. She appears well-developed and well-nourished.  HENT:  Head: Normocephalic.  One obviously broken tooth which is darker than other teeth.  Eyes: EOM are normal.  Neck: Neck supple.  GI: Soft. There is tenderness. There is no rebound and no guarding.  Fetal monitor  - contractions irregular 1.5-2 minutes and then 3-4 minutes.   FHT baseline 135 and baby is active.  Reactive with 15x15 accels.  Musculoskeletal: Normal range of motion.  Neurological: She is alert and oriented to person, place, and time.  Skin: Skin is warm and dry.  Psychiatric: She has a normal mood and affect.    MAU Course  Procedures  MDM 1805 Consult with Dr. Dareen Piano re: plan of care - IVF and Betamethasone  1900 Care assumed by Sharen Counter, CNM  Assessment and Plan    BURLESON,TERRI 07/10/2013, 5:56 PM   MDM  After 1000 ml IV fluids, pt reports more painful,  more regular contractions.  Toco with ctx Q 5 minutes, mild to moderate to palpation.  EFM remains Category I.  Consult Dr Dareen Piano.  Terbutaline dose x1, if contractions remain, admit for observation.  Report to Joseph Berkshire, PA  MDM Patient given Terbutaline dose x 1 Patient reports decreased frequency of contractions and mild to moderate lower back cramping EFM remains Category 1 with mild uterine irritability noted with occasional mild contractions Cervix is unchanged from earlier today Discussed patient with Dr. Dareen Piano. Ok for discharge with PTL precautions. Rx for PO Procardia 10 mg q 6 hours PRN. Take 1 dose tonight, bed rest and return tomorrow for Betamethasone and follow-up in the office on Thursday.    A: Preterm contractions  P: Discharge home Rx for Procardia given to patient Bed rest discussed. Work excuse note given Patient advised to return to MAU tomorrow for second Betamethasone injection or sooner if symptoms worsen Patient to call [facility] for appointment for follow-up on Thursday Patient may return to MAU as needed sooner or if her condition were to change or worsen  Freddi Starr, PA-C  07/10/2013 8:40 PM

## 2013-07-10 NOTE — MAU Note (Signed)
Pt presents to MAU from [facility] d/t loss of mucous, being 1 cm dilated and and abdominal cramping and pressure.

## 2013-07-11 ENCOUNTER — Encounter (HOSPITAL_COMMUNITY): Payer: Self-pay | Admitting: *Deleted

## 2013-07-11 ENCOUNTER — Inpatient Hospital Stay (HOSPITAL_COMMUNITY)
Admission: AD | Admit: 2013-07-11 | Discharge: 2013-07-11 | Disposition: A | Discharge status: Home / Self Care | Payer: BC Managed Care – PPO | Source: Ambulatory Visit | Attending: Obstetrics and Gynecology | Admitting: Obstetrics and Gynecology

## 2013-07-11 ENCOUNTER — Inpatient Hospital Stay (HOSPITAL_COMMUNITY): Payer: BC Managed Care – PPO

## 2013-07-11 DIAGNOSIS — O4700 False labor before 37 completed weeks of gestation, unspecified trimester: Secondary | ICD-10-CM | POA: Insufficient documentation

## 2013-07-11 DIAGNOSIS — R109 Unspecified abdominal pain: Secondary | ICD-10-CM | POA: Insufficient documentation

## 2013-07-11 DIAGNOSIS — O479 False labor, unspecified: Secondary | ICD-10-CM

## 2013-07-11 DIAGNOSIS — N949 Unspecified condition associated with female genital organs and menstrual cycle: Secondary | ICD-10-CM

## 2013-07-11 DIAGNOSIS — O4703 False labor before 37 completed weeks of gestation, third trimester: Secondary | ICD-10-CM

## 2013-07-11 LAB — AMNISURE RUPTURE OF MEMBRANE (ROM) NOT AT ARMC: Amnisure ROM: NEGATIVE

## 2013-07-11 MED ORDER — BETAMETHASONE SOD PHOS & ACET 6 (3-3) MG/ML IJ SUSP
12.0000 mg | Freq: Once | INTRAMUSCULAR | Status: AC
  Administered 2013-07-11: 12 mg via INTRAMUSCULAR
  Filled 2013-07-11: qty 2

## 2013-07-11 MED ORDER — ONDANSETRON 8 MG PO TBDP
8.0000 mg | ORAL_TABLET | ORAL | Status: AC
  Administered 2013-07-11: 8 mg via ORAL
  Filled 2013-07-11: qty 1

## 2013-07-11 NOTE — MAU Provider Note (Signed)
Chief Complaint:  Contractions   First Provider Initiated Contact with Patient 07/11/13 1852      HPI: Brenda Blankenship is a 27 y.o. G2P1001 at 69w5dwho presents to maternity admissions for second dose of BMZ, also reporting leakage of clear fluid with mucus today, enough to soak her underwear and wear a light pad and nausea since starting the new medication.  She continues to report back pain and intermittent cramping which is not improved by Procardia which was started yesterday.  She reports good fetal movement, denies vaginal bleeding, vaginal itching/burning, urinary symptoms, h/a, dizziness, or fever/chills.     Past Medical History: Past Medical History  Diagnosis Date  . Environmental allergies   . Panic attacks     Past obstetric history: OB History  Gravida Para Term Preterm AB SAB TAB Ectopic Multiple Living  2 1 1  0 0 0 0 0 0 1    # Outcome Date GA Lbr Len/2nd Weight Sex Delivery Anes PTL Lv  2 CUR           1 TRM 07/09/07    M SVD EPI  Y      Past Surgical History: Past Surgical History  Procedure Laterality Date  . Cryotherapy      Family History: History reviewed. No pertinent family history.  Social History: History  Substance Use Topics  . Smoking status: Former Smoker -- 0.25 packs/day for 5 years    Types: Cigarettes  . Smokeless tobacco: Never Used  . Alcohol Use: No    Allergies:  Allergies  Allergen Reactions  . Hydrocodone     Fainting, lightheadness    Meds:  No prescriptions prior to admission    ROS: Pertinent findings in history of present illness.  Physical Exam  Blood pressure 118/69, pulse 64, temperature 98.5 F (36.9 C), temperature source Oral, resp. rate 18, last menstrual period 11/24/2012. GENERAL: Well-developed, well-nourished female in no acute distress.  HEENT: normocephalic HEART: normal rate RESP: normal effort ABDOMEN: Soft, non-tender, gravid appropriate for gestational age EXTREMITIES: Nontender, no  edema NEURO: alert and oriented Pelvic exam: Cervix pink, visually closed, without lesion, moderate amount white creamy discharge, vaginal walls and external genitalia normal, negative pooling with cough/bearing down Dilation: 1 Effacement (%): 20 Cervical Position: Posterior Station: -3 Presentation: Vertex Exam by:: D Simpson RN  Dilation: 1 Effacement (%): 20 Cervical Position: Posterior Station: -3 Presentation: Vertex Exam by:: D Simpson RN  FHT:  Baseline 120 , moderate variability, accelerations present (15x15), no decelerations Contractions: irregular, every 1-10 minutes, mild to palpation   Labs: Results for orders placed during the hospital encounter of 07/11/13 (from the past 24 hour(s))  AMNISURE RUPTURE OF MEMBRANE (ROM)     Status: None   Collection Time    07/11/13  7:15 PM      Result Value Range   Amnisure ROM NEGATIVE     MAU Management: Zofran 8 mg ODT x1 dose in MAU  Assessment: Preterm contractions  Consult Dr Henderson Cloud  Limited OB U/S for AFI   Report to Joseph Berkshire, PA       Follow-up Information   Follow up with HORVATH,MICHELLE A, MD. (As scheduled)    Specialty:  Obstetrics and Gynecology   Contact information:   [address] [place] [address] [phone]       Follow up with [facility]. (As needed if symptoms worsen)    Contact information:   [address] [id number] [place] [address] [phone]       Medication List         acetaminophen 325 MG tablet  Commonly known as:  TYLENOL  Take 325-650 mg by mouth every 6 (six) hours as needed for mild pain or headache.     DICLEGIS 10-10 MG Tbec  Generic drug:  Doxylamine-Pyridoxine  Take 1 tablet by mouth 3 (three) times daily as needed (For nausea.).     flintstones complete 60 MG chewable tablet  Chew 2 tablets by mouth daily.     NIFEdipine 10 MG capsule  Commonly known as:  PROCARDIA   Take 1 capsule (10 mg total) by mouth every 6 (six) hours as needed.     promethazine 25 MG tablet  Commonly known as:  PHENERGAN  Take 25 mg by mouth every 8 (eight) hours as needed for nausea or vomiting.        Sharen Counter Certified Nurse-Midwife 07/11/2013 9:21 PM  Assumed care  MDM AFI normal on OB U/S preliminary report Cervix unchanged at time of recheck Discussed results with Dr. Henderson Cloud. Ok for discharge with preterm labor precautions. Patient should follow-up in the office as scheduled on Thursday.  Patient requested information about lower abdominal pain and pressure that is worse with ambulation. Dr. Henderson Cloud recommends using the pool, bath and/or support belt PRN   A: Preterm contractions Round ligament pain  P: Discharge home Preterm labor precautions discussed Patient advised to try pool, bath and/or abdominal support belt PRN pain Patient encouraged to follow-up in the office on Thursday as scheduled Patient may return to MAU as needed or if her condition were to change or worsen  Freddi Starr, PA-C 07/11/2013 9:26 PM

## 2013-07-11 NOTE — MAU Note (Signed)
Patient states she was seen in MAU yesterday, received 1st dose betamethasone. Received IV fluids, started on medication. States she is still feeling contractions and pressure. States she thinks she is leaking fluid. Passing a "slimy clear discharge." Also to receive 2nd dose BMZ.

## 2013-07-11 NOTE — MAU Note (Signed)
Pt may come off monitor per L Left Kirb CNM

## 2013-08-07 LAB — OB RESULTS CONSOLE GBS: GBS: NEGATIVE

## 2013-08-08 ENCOUNTER — Encounter (HOSPITAL_COMMUNITY): Payer: Self-pay | Admitting: *Deleted

## 2013-08-08 ENCOUNTER — Inpatient Hospital Stay (HOSPITAL_COMMUNITY)
Admission: AD | Admit: 2013-08-08 | Discharge: 2013-08-08 | Disposition: A | Discharge status: Home / Self Care | Payer: BC Managed Care – PPO | Source: Ambulatory Visit | Attending: Obstetrics and Gynecology | Admitting: Obstetrics and Gynecology

## 2013-08-08 DIAGNOSIS — O4700 False labor before 37 completed weeks of gestation, unspecified trimester: Secondary | ICD-10-CM

## 2013-08-08 DIAGNOSIS — L293 Anogenital pruritus, unspecified: Secondary | ICD-10-CM | POA: Insufficient documentation

## 2013-08-08 DIAGNOSIS — R1013 Epigastric pain: Secondary | ICD-10-CM | POA: Insufficient documentation

## 2013-08-08 DIAGNOSIS — Z87891 Personal history of nicotine dependence: Secondary | ICD-10-CM | POA: Insufficient documentation

## 2013-08-08 DIAGNOSIS — H539 Unspecified visual disturbance: Secondary | ICD-10-CM | POA: Insufficient documentation

## 2013-08-08 HISTORY — DX: Anemia, unspecified: D64.9

## 2013-08-08 HISTORY — DX: Unspecified abnormal cytological findings in specimens from cervix uteri: R87.619

## 2013-08-08 LAB — WET PREP, GENITAL
Clue Cells Wet Prep HPF POC: NONE SEEN
Trich, Wet Prep: NONE SEEN
Yeast Wet Prep HPF POC: NONE SEEN

## 2013-08-08 LAB — COMPREHENSIVE METABOLIC PANEL
ALT: 7 U/L (ref 0–35)
AST: 13 U/L (ref 0–37)
Albumin: 2.4 g/dL — ABNORMAL LOW (ref 3.5–5.2)
Alkaline Phosphatase: 183 U/L — ABNORMAL HIGH (ref 39–117)
BUN: 6 mg/dL (ref 6–23)
CO2: 22 mEq/L (ref 19–32)
Calcium: 8.6 mg/dL (ref 8.4–10.5)
Chloride: 104 mEq/L (ref 96–112)
Creatinine, Ser: 0.82 mg/dL (ref 0.50–1.10)
GFR calc Af Amer: >90 mL/min (ref >90)
GFR calc non Af Amer: >90 mL/min (ref >90)
Glucose, Bld: 71 mg/dL (ref 70–99)
Potassium: 4.7 mEq/L (ref 3.7–5.3)
Sodium: 135 mEq/L — ABNORMAL LOW (ref 137–147)
Total Bilirubin: 0.6 mg/dL (ref 0.3–1.2)
Total Protein: 5.3 g/dL — ABNORMAL LOW (ref 6.0–8.3)

## 2013-08-08 LAB — URINALYSIS, ROUTINE W REFLEX MICROSCOPIC
Bilirubin Urine: NEGATIVE
Glucose, UA: NEGATIVE mg/dL
Hgb urine dipstick: NEGATIVE
Ketones, ur: NEGATIVE mg/dL
Nitrite: NEGATIVE
Protein, ur: NEGATIVE mg/dL
Specific Gravity, Urine: 1.025 (ref 1.005–1.030)
Urobilinogen, UA: 0.2 mg/dL (ref 0.0–1.0)
pH: 7 (ref 5.0–8.0)

## 2013-08-08 LAB — CBC
HCT: 32.6 % — ABNORMAL LOW (ref 36.0–46.0)
Hemoglobin: 11.1 g/dL — ABNORMAL LOW (ref 12.0–15.0)
MCH: 29 pg (ref 26.0–34.0)
MCHC: 34 g/dL (ref 30.0–36.0)
MCV: 85.1 fL (ref 78.0–100.0)
Platelets: 175 10*3/uL (ref 150–400)
RBC: 3.83 MIL/uL — ABNORMAL LOW (ref 3.87–5.11)
RDW: 13.2 % (ref 11.5–15.5)
WBC: 6 10*3/uL (ref 4.0–10.5)

## 2013-08-08 LAB — URINE MICROSCOPIC-ADD ON

## 2013-08-08 LAB — POCT FERN TEST: POCT Fern Test: NEGATIVE

## 2013-08-08 MED ORDER — ZOLPIDEM TARTRATE 5 MG PO TABS: 5.0000 mg | ORAL_TABLET | Freq: Every evening | ORAL | Status: DC | PRN

## 2013-08-08 NOTE — Progress Notes (Signed)
Lisa Leftwich Kirby CNM in earlier to discuss d/c plan. Written and verbal d/c instructions given and understanding voiced. 

## 2013-08-08 NOTE — MAU Note (Signed)
On the pills for contractions.  Yesterday contractions were irregular, but stronger than they had been.  Has been leaking, has filled (completely) 4pads completely. Started spotting last night.  Increase in pelvic pressure noted. For past wk, face is swollen when wakes up, and has little bumps on back, the dr has her taking benadryl ? Allergic reaction.

## 2013-08-08 NOTE — Progress Notes (Signed)
Sharen CounterLisa Leftwich-Kirby CNM

## 2013-08-08 NOTE — MAU Note (Signed)
Pt up to Br 

## 2013-08-08 NOTE — MAU Provider Note (Signed)
Chief Complaint:  No chief complaint on file.   First Provider Initiated Contact with Patient 08/08/13 1206      HPI: Brenda Blankenship is a 28 y.o. G2P1001 at 20w5dwho presents to maternity admissions reporting contractions, pelvic pressure, and leakage of clear fluid soaking 3 pads at home since last night.  She also noted pink spotting when wiping last night and today.  She reports mild h/a today, resolved with Tylenol.  She reports good fetal movement, vaginal itching/burning, urinary symptoms, epigastric pain, visual disturbances, dizziness, n/v, or fever/chills.     Past Medical History: Past Medical History  Diagnosis Date  . Environmental allergies   . Panic attacks   . Preterm labor   . Anemia   . Abnormal cervical Papanicolaou smear     cryo    Past obstetric history: OB History  Gravida Para Term Preterm AB SAB TAB Ectopic Multiple Living  2 1 1  0 0 0 0 0 0 1    # Outcome Date GA Lbr Len/2nd Weight Sex Delivery Anes PTL Lv  2 CUR           1 TRM 07/09/07    M SVD EPI  Y      Past Surgical History: Past Surgical History  Procedure Laterality Date  . Cryotherapy      Family History: Family History  Problem Relation Age of Onset  . Hypertension Mother   . Hypertension Father   . Hyperlipidemia Father   . Diabetes Maternal Grandmother   . Cancer Paternal Grandmother     started in lung  . Hypertension Paternal Grandmother     Social History: History  Substance Use Topics  . Smoking status: Former Smoker -- 0.25 packs/day for 5 years    Types: Cigarettes  . Smokeless tobacco: Never Used     Comment: stopped with preg  . Alcohol Use: No    Allergies:  Allergies  Allergen Reactions  . Hydrocodone     Fainting, lightheadness    Meds:  No prescriptions prior to admission    ROS: Pertinent findings in history of present illness.  Physical Exam  Blood pressure 133/78, pulse 66, temperature 98.2 F (36.8 C), temperature source Oral, resp. rate 20,  height 5' 2.5" (1.588 m), weight 75.297 kg (166 lb), last menstrual period 11/24/2012. Patient Vitals for the past 24 hrs:  BP Temp Temp src Pulse Resp Height Weight  08/08/13 1339 133/78 mmHg 98.2 F (36.8 C) Oral 66 20 - -  08/08/13 1049 134/86 mmHg - - 70 - - -  08/08/13 1035 151/82 mmHg 98.7 F (37.1 C) Oral 65 18 5' 2.5" (1.588 m) 75.297 kg (166 lb)   GENERAL: Well-developed, well-nourished female in no acute distress.  HEENT: normocephalic HEART: normal rate RESP: normal effort ABDOMEN: Soft, non-tender, gravid appropriate for gestational age EXTREMITIES: Nontender, no edema NEURO: alert and oriented SPECULUM EXAM: Negative pooling, large amount thick white mucus-like discharge, no blood noted   Dilation: 2 Effacement (%): 50 Cervical Position: Anterior Exam by:: Sharen Counter CNM  FHT:  Baseline 135 , moderate variability, accelerations present, no decelerations Contractions: irregular, Q 2-6 minutes, mild to palpation   Labs: Results for orders placed during the hospital encounter of 08/08/13 (from the past 24 hour(s))  URINALYSIS, ROUTINE W REFLEX MICROSCOPIC     Status: Abnormal   Collection Time    08/08/13 11:02 AM      Result Value Range   Color, Urine YELLOW  YELLOW   APPearance  CLOUDY (*) CLEAR   Specific Gravity, Urine 1.025  1.005 - 1.030   pH 7.0  5.0 - 8.0   Glucose, UA NEGATIVE  NEGATIVE mg/dL   Hgb urine dipstick NEGATIVE  NEGATIVE   Bilirubin Urine NEGATIVE  NEGATIVE   Ketones, ur NEGATIVE  NEGATIVE mg/dL   Protein, ur NEGATIVE  NEGATIVE mg/dL   Urobilinogen, UA 0.2  0.0 - 1.0 mg/dL   Nitrite NEGATIVE  NEGATIVE   Leukocytes, UA SMALL (*) NEGATIVE  URINE MICROSCOPIC-ADD ON     Status: Abnormal   Collection Time    08/08/13 11:02 AM      Result Value Range   Squamous Epithelial / LPF MANY (*) RARE   WBC, UA 0-2  <3 WBC/hpf   Bacteria, UA FEW (*) RARE  CBC     Status: Abnormal   Collection Time    08/08/13 12:07 PM      Result Value  Range   WBC 6.0  4.0 - 10.5 K/uL   RBC 3.83 (*) 3.87 - 5.11 MIL/uL   Hemoglobin 11.1 (*) 12.0 - 15.0 g/dL   HCT 40.932.6 (*) 81.136.0 - 91.446.0 %   MCV 85.1  78.0 - 100.0 fL   MCH 29.0  26.0 - 34.0 pg   MCHC 34.0  30.0 - 36.0 g/dL   RDW 78.213.2  95.611.5 - 21.315.5 %   Platelets 175  150 - 400 K/uL  COMPREHENSIVE METABOLIC PANEL     Status: Abnormal   Collection Time    08/08/13 12:07 PM      Result Value Range   Sodium 135 (*) 137 - 147 mEq/L   Potassium 4.7  3.7 - 5.3 mEq/L   Chloride 104  96 - 112 mEq/L   CO2 22  19 - 32 mEq/L   Glucose, Bld 71  70 - 99 mg/dL   BUN 6  6 - 23 mg/dL   Creatinine, Ser 0.860.82  0.50 - 1.10 mg/dL   Calcium 8.6  8.4 - 57.810.5 mg/dL   Total Protein 5.3 (*) 6.0 - 8.3 g/dL   Albumin 2.4 (*) 3.5 - 5.2 g/dL   AST 13  0 - 37 U/L   ALT 7  0 - 35 U/L   Alkaline Phosphatase 183 (*) 39 - 117 U/L   Total Bilirubin 0.6  0.3 - 1.2 mg/dL   GFR calc non Af Amer >90  >90 mL/min   GFR calc Af Amer >90  >90 mL/min  WET PREP, GENITAL     Status: Abnormal   Collection Time    08/08/13 12:20 PM      Result Value Range   Yeast Wet Prep HPF POC NONE SEEN  NONE SEEN   Trich, Wet Prep NONE SEEN  NONE SEEN   Clue Cells Wet Prep HPF POC NONE SEEN  NONE SEEN   WBC, Wet Prep HPF POC MODERATE (*) NONE SEEN  POCT FERN TEST     Status: None   Collection Time    08/08/13  1:00 PM      Result Value Range   POCT Fern Test Negative = intact amniotic membranes       Assessment: 1. Threatened preterm labor     Plan: Consult Dr Henderson CloudHorvath Discharge home Labor precautions and fetal kick counts Ambien 5 mg Q HS PRN for sleep Keep scheduled OB appointments Return to MAU as needed      Follow-up Information   Follow up with [facility] (Keep scheduled  appointments. Return to MAU as needed.)    Contact information:   [address] [place] [address] [phone]       Medication List         acetaminophen 325 MG tablet   Commonly known as:  TYLENOL  Take 650 mg by mouth every 6 (six) hours as needed for mild pain or headache.     BENADRYL ALLERGY PO  Take 2 tablets by mouth 2 (two) times daily as needed (swelling & itching).     DICLEGIS 10-10 MG Tbec  Generic drug:  Doxylamine-Pyridoxine  Take 1 tablet by mouth 3 (three) times daily as needed (For nausea.).     flintstones complete 60 MG chewable tablet  Chew 2 tablets by mouth daily.     NIFEdipine 10 MG capsule  Commonly known as:  PROCARDIA  Take 10 mg by mouth every 6 (six) hours as needed (contractilons).     zolpidem 5 MG tablet  Commonly known as:  AMBIEN  Take 1 tablet (5 mg total) by mouth at bedtime as needed for sleep.        Sharen Counter Certified Nurse-Midwife 08/09/2013 8:33 AM

## 2013-08-08 NOTE — Discharge Instructions (Signed)

## 2013-08-13 ENCOUNTER — Encounter (HOSPITAL_COMMUNITY): Payer: Self-pay | Admitting: *Deleted

## 2013-08-13 ENCOUNTER — Inpatient Hospital Stay (HOSPITAL_COMMUNITY)
Admission: AD | Admit: 2013-08-13 | Discharge: 2013-08-14 | Disposition: A | Discharge status: Home / Self Care | Payer: BC Managed Care – PPO | Source: Ambulatory Visit | Attending: Obstetrics and Gynecology | Admitting: Obstetrics and Gynecology

## 2013-08-13 DIAGNOSIS — O479 False labor, unspecified: Secondary | ICD-10-CM | POA: Insufficient documentation

## 2013-08-13 DIAGNOSIS — O26859 Spotting complicating pregnancy, unspecified trimester: Secondary | ICD-10-CM | POA: Insufficient documentation

## 2013-08-13 LAB — POCT FERN TEST: POCT Fern Test: NEGATIVE

## 2013-08-13 NOTE — MAU Note (Signed)
Contractions every 10-20 minutes tonight. Some vaginal spotting today. Reports leaking today and unsure if it's amniotic fluid or vaginal discharge.

## 2013-08-14 NOTE — Discharge Instructions (Signed)
Braxton Hicks Contractions Pregnancy is commonly associated with contractions of the uterus throughout the pregnancy. Towards the end of pregnancy (32 to 34 weeks), these contractions (Braxton Hicks) can develop more often and may become more forceful. This is not true labor because these contractions do not result in opening (dilatation) and thinning of the cervix. They are sometimes difficult to tell apart from true labor because these contractions can be forceful and people have different pain tolerances. You should not feel embarrassed if you go to the hospital with false labor. Sometimes, the only way to tell if you are in true labor is for your caregiver to follow the changes in the cervix. How to tell the difference between true and false labor:  False labor.  The contractions of false labor are usually shorter, irregular and not as hard as those of true labor.  They are often felt in the front of the lower abdomen and in the groin.  They may leave with walking around or changing positions while lying down.  They get weaker and are shorter lasting as time goes on.  These contractions are usually irregular.  They do not usually become progressively stronger, regular and closer together as with true labor.  True labor.  Contractions in true labor last 30 to 70 seconds, become very regular, usually become more intense, and increase in frequency.  They do not go away with walking.  The discomfort is usually felt in the top of the uterus and spreads to the lower abdomen and low back.  True labor can be determined by your caregiver with an exam. This will show that the cervix is dilating and getting thinner. If there are no prenatal problems or other health problems associated with the pregnancy, it is completely safe to be sent home with false labor and await the onset of true labor. HOME CARE INSTRUCTIONS   Keep up with your usual exercises and instructions.  Take medications as  directed.  Keep your regular prenatal appointment.  Eat and drink lightly if you think you are going into labor.  If BH contractions are making you uncomfortable:  Change your activity position from lying down or resting to walking/walking to resting.  Sit and rest in a tub of warm water.  Drink 2 to 3 glasses of water. Dehydration may cause B-H contractions.  Do slow and deep breathing several times an hour. SEEK IMMEDIATE MEDICAL CARE IF:   Your contractions continue to become stronger, more regular, and closer together.  You have a gushing, burst or leaking of fluid from the vagina.  An oral temperature above 102 F (38.9 C) develops.  You have passage of blood-tinged mucus.  You develop vaginal bleeding.  You develop continuous belly (abdominal) pain.  You have low back pain that you never had before.  You feel the baby's head pushing down causing pelvic pressure.  The baby is not moving as much as it used to. Document Released: 07/13/2005 Document Revised: 10/05/2011 Document Reviewed: 04/24/2013 [employer] Patient Information 2014 [employer].  Fetal Movement Counts Patient Name: __________________________________________________ Patient Due Date: ____________________ Performing a fetal movement count is highly recommended in high-risk pregnancies, but it is good for every pregnant woman to do. Your caregiver may ask you to start counting fetal movements at 28 weeks of the pregnancy. Fetal movements often increase:  After eating a full meal.  After physical activity.  After eating or drinking something sweet or cold.  At rest. Pay attention to when you feel   the baby is most active. This will help you notice a pattern of your baby's sleep and wake cycles and what factors contribute to an increase in fetal movement. It is important to perform a fetal movement count at the same time each day when your baby is normally most active.  HOW TO COUNT FETAL  MOVEMENTS 1. Find a quiet and comfortable area to sit or lie down on your left side. Lying on your left side provides the best blood and oxygen circulation to your baby. 2. Write down the day and time on a sheet of paper or in a journal. 3. Start counting kicks, flutters, swishes, rolls, or jabs in a 2 hour period. You should feel at least 10 movements within 2 hours. 4. If you do not feel 10 movements in 2 hours, wait 2 3 hours and count again. Look for a change in the pattern or not enough counts in 2 hours. SEEK MEDICAL CARE IF:  You feel less than 10 counts in 2 hours, tried twice.  There is no movement in over an hour.  The pattern is changing or taking longer each day to reach 10 counts in 2 hours.  You feel the baby is not moving as he or she usually does. Date: ____________ Movements: ____________ Start time: ____________ Finish time: ____________  Date: ____________ Movements: ____________ Start time: ____________ Finish time: ____________ Date: ____________ Movements: ____________ Start time: ____________ Finish time: ____________ Date: ____________ Movements: ____________ Start time: ____________ Finish time: ____________ Date: ____________ Movements: ____________ Start time: ____________ Finish time: ____________ Date: ____________ Movements: ____________ Start time: ____________ Finish time: ____________ Date: ____________ Movements: ____________ Start time: ____________ Finish time: ____________ Date: ____________ Movements: ____________ Start time: ____________ Finish time: ____________  Date: ____________ Movements: ____________ Start time: ____________ Finish time: ____________ Date: ____________ Movements: ____________ Start time: ____________ Finish time: ____________ Date: ____________ Movements: ____________ Start time: ____________ Finish time: ____________ Date: ____________ Movements: ____________ Start time: ____________ Finish time: ____________ Date: ____________  Movements: ____________ Start time: ____________ Finish time: ____________ Date: ____________ Movements: ____________ Start time: ____________ Finish time: ____________ Date: ____________ Movements: ____________ Start time: ____________ Finish time: ____________  Date: ____________ Movements: ____________ Start time: ____________ Finish time: ____________ Date: ____________ Movements: ____________ Start time: ____________ Finish time: ____________ Date: ____________ Movements: ____________ Start time: ____________ Finish time: ____________ Date: ____________ Movements: ____________ Start time: ____________ Finish time: ____________ Date: ____________ Movements: ____________ Start time: ____________ Finish time: ____________ Date: ____________ Movements: ____________ Start time: ____________ Finish time: ____________ Date: ____________ Movements: ____________ Start time: ____________ Finish time: ____________  Date: ____________ Movements: ____________ Start time: ____________ Finish time: ____________ Date: ____________ Movements: ____________ Start time: ____________ Finish time: ____________ Date: ____________ Movements: ____________ Start time: ____________ Finish time: ____________ Date: ____________ Movements: ____________ Start time: ____________ Finish time: ____________ Date: ____________ Movements: ____________ Start time: ____________ Finish time: ____________ Date: ____________ Movements: ____________ Start time: ____________ Finish time: ____________ Date: ____________ Movements: ____________ Start time: ____________ Finish time: ____________  Date: ____________ Movements: ____________ Start time: ____________ Finish time: ____________ Date: ____________ Movements: ____________ Start time: ____________ Finish time: ____________ Date: ____________ Movements: ____________ Start time: ____________ Finish time: ____________ Date: ____________ Movements: ____________ Start time:  ____________ Finish time: ____________ Date: ____________ Movements: ____________ Start time: ____________ Finish time: ____________ Date: ____________ Movements: ____________ Start time: ____________ Finish time: ____________ Date: ____________ Movements: ____________ Start time: ____________ Finish time: ____________  Date: ____________ Movements: ____________ Start time: ____________ Finish time: ____________ Date: ____________ Movements: ____________ Start   time: ____________ Finish time: ____________ Date: ____________ Movements: ____________ Start time: ____________ Finish time: ____________ Date: ____________ Movements: ____________ Start time: ____________ Finish time: ____________ Date: ____________ Movements: ____________ Start time: ____________ Finish time: ____________ Date: ____________ Movements: ____________ Start time: ____________ Finish time: ____________ Date: ____________ Movements: ____________ Start time: ____________ Finish time: ____________  Date: ____________ Movements: ____________ Start time: ____________ Finish time: ____________ Date: ____________ Movements: ____________ Start time: ____________ Finish time: ____________ Date: ____________ Movements: ____________ Start time: ____________ Finish time: ____________ Date: ____________ Movements: ____________ Start time: ____________ Finish time: ____________ Date: ____________ Movements: ____________ Start time: ____________ Finish time: ____________ Date: ____________ Movements: ____________ Start time: ____________ Finish time: ____________ Date: ____________ Movements: ____________ Start time: ____________ Finish time: ____________  Date: ____________ Movements: ____________ Start time: ____________ Finish time: ____________ Date: ____________ Movements: ____________ Start time: ____________ Finish time: ____________ Date: ____________ Movements: ____________ Start time: ____________ Finish time: ____________ Date:  ____________ Movements: ____________ Start time: ____________ Finish time: ____________ Date: ____________ Movements: ____________ Start time: ____________ Finish time: ____________ Date: ____________ Movements: ____________ Start time: ____________ Finish time: ____________ Document Released: 08/12/2006 Document Revised: 06/29/2012 Document Reviewed: 05/09/2012 [employer] Patient Information 2014 [employer].  

## 2013-08-21 ENCOUNTER — Inpatient Hospital Stay (HOSPITAL_COMMUNITY)
Admission: AD | Admit: 2013-08-21 | Discharge: 2013-08-22 | DRG: 775 | Disposition: A | Discharge status: Home / Self Care | Payer: BC Managed Care – PPO | Source: Ambulatory Visit | Attending: Obstetrics and Gynecology | Admitting: Obstetrics and Gynecology

## 2013-08-21 ENCOUNTER — Encounter (HOSPITAL_COMMUNITY): Payer: Self-pay | Admitting: *Deleted

## 2013-08-21 DIAGNOSIS — F1210 Cannabis abuse, uncomplicated: Secondary | ICD-10-CM | POA: Diagnosis present

## 2013-08-21 DIAGNOSIS — Z87891 Personal history of nicotine dependence: Secondary | ICD-10-CM

## 2013-08-21 DIAGNOSIS — O99344 Other mental disorders complicating childbirth: Principal | ICD-10-CM | POA: Diagnosis present

## 2013-08-21 LAB — CBC
HCT: 35.5 % — ABNORMAL LOW (ref 36.0–46.0)
Hemoglobin: 12.2 g/dL (ref 12.0–15.0)
MCH: 29.3 pg (ref 26.0–34.0)
MCHC: 34.4 g/dL (ref 30.0–36.0)
MCV: 85.1 fL (ref 78.0–100.0)
Platelets: 154 10*3/uL (ref 150–400)
RBC: 4.17 MIL/uL (ref 3.87–5.11)
RDW: 13.4 % (ref 11.5–15.5)
WBC: 10.9 10*3/uL — ABNORMAL HIGH (ref 4.0–10.5)

## 2013-08-21 LAB — COMPREHENSIVE METABOLIC PANEL
ALT: 7 U/L (ref 0–35)
AST: 17 U/L (ref 0–37)
Albumin: 2.6 g/dL — ABNORMAL LOW (ref 3.5–5.2)
Alkaline Phosphatase: 196 U/L — ABNORMAL HIGH (ref 39–117)
BUN: 8 mg/dL (ref 6–23)
CO2: 21 mEq/L (ref 19–32)
Calcium: 8.6 mg/dL (ref 8.4–10.5)
Chloride: 104 mEq/L (ref 96–112)
Creatinine, Ser: 0.76 mg/dL (ref 0.50–1.10)
GFR calc Af Amer: >90 mL/min (ref >90)
GFR calc non Af Amer: >90 mL/min (ref >90)
Glucose, Bld: 80 mg/dL (ref 70–99)
Potassium: 4.4 mEq/L (ref 3.7–5.3)
Sodium: 136 mEq/L — ABNORMAL LOW (ref 137–147)
Total Bilirubin: 0.7 mg/dL (ref 0.3–1.2)
Total Protein: 5.8 g/dL — ABNORMAL LOW (ref 6.0–8.3)

## 2013-08-21 MED ORDER — MISOPROSTOL 200 MCG PO TABS
1000.0000 ug | ORAL_TABLET | Freq: Once | ORAL | Status: AC
  Administered 2013-08-21: 1000 ug via RECTAL
  Filled 2013-08-21: qty 5

## 2013-08-21 MED ORDER — BENZOCAINE-MENTHOL 20-0.5 % EX AERO
1.0000 "application " | INHALATION_SPRAY | CUTANEOUS | Status: DC | PRN
  Administered 2013-08-21: 1 via TOPICAL
  Filled 2013-08-21: qty 56

## 2013-08-21 MED ORDER — OXYTOCIN 10 UNIT/ML IJ SOLN
INTRAMUSCULAR | Status: AC
  Administered 2013-08-21: 10 [IU] via INTRAMUSCULAR
  Filled 2013-08-21: qty 2

## 2013-08-21 MED ORDER — SENNOSIDES-DOCUSATE SODIUM 8.6-50 MG PO TABS
2.0000 | ORAL_TABLET | ORAL | Status: DC
  Administered 2013-08-21: 2 via ORAL
  Filled 2013-08-21: qty 2

## 2013-08-21 MED ORDER — OXYTOCIN 10 UNIT/ML IJ SOLN
10.0000 [IU] | Freq: Once | INTRAMUSCULAR | Status: AC
  Administered 2013-08-21: 10 [IU] via INTRAMUSCULAR

## 2013-08-21 MED ORDER — SIMETHICONE 80 MG PO CHEW: 80.0000 mg | CHEWABLE_TABLET | ORAL | Status: DC | PRN

## 2013-08-21 MED ORDER — PRENATAL MULTIVITAMIN CH
1.0000 | ORAL_TABLET | Freq: Every day | ORAL | Status: DC
  Filled 2013-08-21: qty 1

## 2013-08-21 MED ORDER — DIBUCAINE 1 % RE OINT: 1.0000 "application " | TOPICAL_OINTMENT | RECTAL | Status: DC | PRN

## 2013-08-21 MED ORDER — ERYTHROMYCIN 5 MG/GM OP OINT
TOPICAL_OINTMENT | OPHTHALMIC | Status: AC
  Filled 2013-08-21: qty 1

## 2013-08-21 MED ORDER — LANOLIN HYDROUS EX OINT: TOPICAL_OINTMENT | CUTANEOUS | Status: DC | PRN

## 2013-08-21 MED ORDER — IBUPROFEN 600 MG PO TABS
600.0000 mg | ORAL_TABLET | Freq: Four times a day (QID) | ORAL | Status: DC
  Administered 2013-08-21 – 2013-08-22 (×5): 600 mg via ORAL
  Filled 2013-08-21 (×5): qty 1

## 2013-08-21 MED ORDER — OXYCODONE-ACETAMINOPHEN 5-325 MG PO TABS
1.0000 | ORAL_TABLET | ORAL | Status: DC | PRN
  Administered 2013-08-21 (×4): 1 via ORAL
  Administered 2013-08-21: 2 via ORAL
  Administered 2013-08-22 (×2): 1 via ORAL
  Filled 2013-08-21 (×6): qty 1
  Filled 2013-08-21: qty 2

## 2013-08-21 MED ORDER — DIPHENHYDRAMINE HCL 25 MG PO CAPS: 25.0000 mg | ORAL_CAPSULE | Freq: Four times a day (QID) | ORAL | Status: DC | PRN

## 2013-08-21 MED ORDER — WITCH HAZEL-GLYCERIN EX PADS: 1.0000 "application " | MEDICATED_PAD | CUTANEOUS | Status: DC | PRN

## 2013-08-21 MED ORDER — ZOLPIDEM TARTRATE 5 MG PO TABS: 5.0000 mg | ORAL_TABLET | Freq: Every evening | ORAL | Status: DC | PRN

## 2013-08-21 MED ORDER — ONDANSETRON HCL 4 MG PO TABS: 4.0000 mg | ORAL_TABLET | ORAL | Status: DC | PRN

## 2013-08-21 MED ORDER — TETANUS-DIPHTH-ACELL PERTUSSIS 5-2.5-18.5 LF-MCG/0.5 IM SUSP: 0.5000 mL | Freq: Once | INTRAMUSCULAR | Status: DC

## 2013-08-21 MED ORDER — PNEUMOCOCCAL VAC POLYVALENT 25 MCG/0.5ML IJ INJ
0.5000 mL | INJECTION | INTRAMUSCULAR | Status: DC
  Filled 2013-08-21: qty 0.5

## 2013-08-21 MED ORDER — ONDANSETRON HCL 4 MG/2ML IJ SOLN: 4.0000 mg | INTRAMUSCULAR | Status: DC | PRN

## 2013-08-21 MED ORDER — OXYTOCIN 40 UNITS IN LACTATED RINGERS INFUSION - SIMPLE MED
62.5000 mL/h | Freq: Once | INTRAVENOUS | Status: AC
  Administered 2013-08-21: 62.5 mL/h via INTRAVENOUS
  Filled 2013-08-21: qty 1000

## 2013-08-21 NOTE — MAU Note (Signed)
Patient IV pulled while transferring to wheelchair. Uterus firm with scant bleeding. Patient does not want another IV started at this time. Informed if bleeding were to increase or her uterus is boggy will need to restart another IV.

## 2013-08-21 NOTE — MAU Note (Signed)
Patient arrived approx. 0405 with complaints of severe back pain and contractions. SVE 10/100/BBOW. Feeling the urge to push. Muhammad CNM notified

## 2013-08-21 NOTE — Progress Notes (Signed)
CSW attempted to assess pt however she was sleeping. CSW will return when she is awake.

## 2013-08-21 NOTE — Progress Notes (Signed)
On initial assessment pt c/o severe cramping in abd 9/10 and felt nauseous. Pt vomited a moderate amount of yellowish sputum into trash can. Fundus slightly boggy but firm with massage. Had 3-4 quarter size clots with moderate amount of bleeding during fundal assessment. BP 168/93. Notified Dr. Tenny Crawoss on Status with orders received. Also ordered to restart IV and to report Lab results to oncoming MD. On Nurse aware

## 2013-08-21 NOTE — Progress Notes (Signed)
Dr. Tenny Crawoss notified patient uterus boggy, passed several 3-4 cm size clots. Patient given Oxytocin 10 units IM at delivery. Orders received for Cytotec and pitocin IV

## 2013-08-21 NOTE — H&P (Signed)
Brenda Blankenship is a 28 y.o. female presenting for contractions  28 yo G2P1001 @ 38+4 presents to MAU for painful contractions and was found to be complete and precipitously delivered iin MAU by CNM History OB History   Grav Para Term Preterm Abortions TAB SAB Ect Mult Living   2 2 2  0 0 0 0 0 0 2     Past Medical History  Diagnosis Date  . Environmental allergies   . Panic attacks   . Preterm labor   . Anemia   . Abnormal cervical Papanicolaou smear     cryo   Past Surgical History  Procedure Laterality Date  . Cryotherapy     Family History: family history includes Cancer in her paternal grandmother; Diabetes in her maternal grandmother; Hyperlipidemia in her father; Hypertension in her father, mother, and paternal grandmother. Social History:  reports that she has quit smoking. Her smoking use included Cigarettes. She has a 1.25 pack-year smoking history. She has never used smokeless tobacco. She reports that she uses illicit drugs (Marijuana). She reports that she does not drink alcohol.   Prenatal Transfer Tool  Maternal Diabetes: No Genetic Screening: Normal Maternal Ultrasounds/Referrals: Normal Fetal Ultrasounds or other Referrals:  None Maternal Substance Abuse:  Yes:  Type: Marijuana Significant Maternal Medications:  None Significant Maternal Lab Results:  None Other Comments:  None  ROS    Blood pressure 168/93, pulse 65, temperature 98 F (36.7 C), temperature source Oral, resp. rate 20, last menstrual period 11/24/2012, SpO2 100.00%, unknown if currently breastfeeding. Exam Physical Exam  Prenatal labs: ABO, Rh: --/--/O POS (06/16 1209) Antibody:  negative Rubella:  immune RPR:   NR HBsAg:   Neg HIV:   NR GBS: Negative (01/12 0000)   Assessment/Plan: PPT delivery in mau attended bu nurse midwife Pt with elevated BPs and persistent bleeding afterward. Received IM Pit, rectal cytotech and IV placed for pit   Davine Sweney H. 08/21/2013, 7:17  AM

## 2013-08-22 LAB — RPR: RPR Ser Ql: NONREACTIVE

## 2013-08-22 NOTE — Progress Notes (Signed)
Patient declines tdap  

## 2013-08-22 NOTE — Clinical Social Work Maternal (Signed)
Clinical Social Work Department PSYCHOSOCIAL ASSESSMENT - MATERNAL/CHILD 08/22/2013  Patient:  CLOTILDE, LOTH  Account Number:  [id number]  [place] Date:  08/21/2013  Ardine Eng Name:   Janeal Holmes    Clinical Social Worker:  Gerri Spore, LCSW   Date/Time:  08/22/2013 11:37 AM  Date Referred:  08/22/2013   Referral source  CN     Referred reason  Substance Abuse  Other - See comment   Other referral source:    I:  FAMILY / [place] legal guardian:  PARENT  Guardian - Name Guardian - Age Guardian - Address  Elmyra Banwart [address] [place], [place] [address]  Nobie Putnam 28 (same as above)   Other household support members/support persons Name Relationship DOB  Jacklyn Shell [facility] 07/09/2007   Other support:   Family    II  PSYCHOSOCIAL DATA Information Source:  Patient Interview  Museum/gallery curator and Community Resources Employment:   Tax inspector resources:  [employer] If [place]:  [place] Other  [place] / Grade:   Maternity Care Coordinator / Child Services Coordination / Early Interventions:  Cultural issues impacting care:    III  STRENGTHS Strengths  Adequate Resources  Home prepared for Child (including basic supplies)  Supportive family/friends   Strength comment:    IV  RISK FACTORS AND CURRENT PROBLEMS Current Problem:  YES   Risk Factor & Current Problem Patient Issue Family Issue Risk Factor / Current Problem Comment  Substance Abuse Y N Hx of MJ use  Mental Illness Y N Hx of panic attacks    V  SOCIAL WORK ASSESSMENT CSW met with pt to assess her frequency of MJ use & history of panic attacks.  Pt was accompanied by her mother & spouse when CSW entered 1st floor hospital room.  Pt asked her mother to step out of room for privacy reasons however allowed FOB to remain.  Pt gave CSW verbal permission to speak in his presence.  Pt is a 28 year old, G3P2 who lives with  her spouse & child.  The couple plans to discharge to pts mothers home since their house is being renovated.  The couple will be living at [address]; [place], [place] [address], temporarily.  The pt admits to smoking MJ "maybe once or twice, every other week" prior to pregnancy confirmation at 2-3 months.  Pt told CSW that she tried to stop smoking & discussed her continued use with her OBGYN. Pt explained that she was caring for her sick grandmother, which caused her a lot of anxiety.  According to pt, the MJ smoke helped relieve her symptoms.  Pt admits that she continued to smoke until her 3rd trimester.  She denies other illegal substance use.  CSW explained hospital drug testing policy & pt verbalized an understanding.  Pt told CSW that her son was born positive for MJ & CPS was involved.  She does not appear worried or concerned about CPS involvement, as she is familiar with the process.  UDS is positive for MJ, meconium results are pending.  Pt experienced depression symptoms 3 years ago however denies any symptoms since then.  No history of SI/HI.  CSW encouraged pt to seek medical attention if PP depression symptoms arise.  Pt appears receptive to information discussed.   Pt has all the necessary supplies for the infant & good family support.  FOB at the bedside, aware of  history & supportive.  CSW reported positive MJ screen to CPS & will continue to assist family as needed.      [place] Social Work Plan  Child Scientist, forensic Report  No Further Intervention Required / No Barriers to Discharge   Type of pt/family education:   If child protective services report - county:  [place] If child protective services report - date:  08/22/2013 Information/referral to community resources comment:   Other social work plan:

## 2013-08-22 NOTE — Discharge Summary (Signed)
Obstetric Discharge Summary Reason for Admission: onset of labor Prenatal Procedures: ultrasound Intrapartum Procedures: spontaneous vaginal delivery Postpartum Procedures: none Complications-Operative and Postpartum: precipitously delivered in MAU by RN Hemoglobin  Date Value Range Status  08/21/2013 12.2  12.0 - 15.0 g/dL Final     HCT  Date Value Range Status  08/21/2013 35.5* 36.0 - 46.0 % Final    Physical Exam:  General: alert and cooperative Lochia: appropriate Uterine Fundus: firm DVT Evaluation: No evidence of DVT seen on physical exam.  Discharge Diagnoses: Term Pregnancy-delivered  Discharge Information: Date: 08/22/2013 Activity: pelvic rest Diet: routine Medications: PNV and Ibuprofen Condition: stable Instructions: refer to practice specific booklet Discharge to: home Follow-up Information   Follow up with Almon HerculesOSS,KENDRA H., MD In 4 weeks.   Specialty:  Obstetrics and Gynecology   Contact information:   [address] [place] [id number] [phone]       Newborn Data: Live born female  Birth Weight:  APGAR: 9, 9  Home with mother.  Brenda Blankenship, Brenda Blankenship 08/22/2013, 10:22 AM

## 2013-08-23 ENCOUNTER — Encounter (HOSPITAL_COMMUNITY): Payer: Self-pay | Admitting: *Deleted

## 2013-11-10 ENCOUNTER — Encounter (HOSPITAL_COMMUNITY): Payer: Self-pay | Admitting: *Deleted

## 2013-11-10 ENCOUNTER — Encounter (HOSPITAL_COMMUNITY): Payer: BC Managed Care – PPO | Admitting: Anesthesiology

## 2013-11-10 ENCOUNTER — Encounter (HOSPITAL_COMMUNITY)
Admission: RE | Disposition: A | Discharge status: Home / Self Care | Payer: Self-pay | Source: Ambulatory Visit | Attending: Obstetrics and Gynecology

## 2013-11-10 ENCOUNTER — Ambulatory Visit (HOSPITAL_COMMUNITY)
Admission: RE | Admit: 2013-11-10 | Discharge: 2013-11-10 | Disposition: A | Discharge status: Home / Self Care | Payer: BC Managed Care – PPO | Source: Ambulatory Visit | Attending: Obstetrics and Gynecology | Admitting: Obstetrics and Gynecology

## 2013-11-10 ENCOUNTER — Ambulatory Visit (HOSPITAL_COMMUNITY): Payer: BC Managed Care – PPO | Admitting: Anesthesiology

## 2013-11-10 DIAGNOSIS — Z302 Encounter for sterilization: Secondary | ICD-10-CM | POA: Insufficient documentation

## 2013-11-10 DIAGNOSIS — Z87891 Personal history of nicotine dependence: Secondary | ICD-10-CM | POA: Insufficient documentation

## 2013-11-10 DIAGNOSIS — D649 Anemia, unspecified: Secondary | ICD-10-CM | POA: Insufficient documentation

## 2013-11-10 HISTORY — PX: LAPAROSCOPIC TUBAL LIGATION: SHX1937

## 2013-11-10 LAB — CBC
HCT: 39.3 % (ref 36.0–46.0)
Hemoglobin: 12.9 g/dL (ref 12.0–15.0)
MCH: 27.5 pg (ref 26.0–34.0)
MCHC: 32.8 g/dL (ref 30.0–36.0)
MCV: 83.8 fL (ref 78.0–100.0)
Platelets: 225 10*3/uL (ref 150–400)
RBC: 4.69 MIL/uL (ref 3.87–5.11)
RDW: 12.4 % (ref 11.5–15.5)
WBC: 2.8 10*3/uL — ABNORMAL LOW (ref 4.0–10.5)

## 2013-11-10 LAB — PREGNANCY, URINE: Preg Test, Ur: NEGATIVE

## 2013-11-10 SURGERY — LIGATION, FALLOPIAN TUBE, LAPAROSCOPIC
Anesthesia: General | Site: Abdomen | Laterality: Bilateral

## 2013-11-10 MED ORDER — LIDOCAINE HCL (CARDIAC) 20 MG/ML IV SOLN
INTRAVENOUS | Status: AC
  Filled 2013-11-10: qty 5

## 2013-11-10 MED ORDER — MIDAZOLAM HCL 2 MG/2ML IJ SOLN: 0.5000 mg | Freq: Once | INTRAMUSCULAR | Status: DC | PRN

## 2013-11-10 MED ORDER — ONDANSETRON HCL 4 MG/2ML IJ SOLN
INTRAMUSCULAR | Status: AC
  Filled 2013-11-10: qty 2

## 2013-11-10 MED ORDER — MEPERIDINE HCL 25 MG/ML IJ SOLN
6.2500 mg | INTRAMUSCULAR | Status: DC | PRN
  Administered 2013-11-10: 6.25 mg via INTRAVENOUS

## 2013-11-10 MED ORDER — PROMETHAZINE HCL 25 MG/ML IJ SOLN: 6.2500 mg | INTRAMUSCULAR | Status: DC | PRN

## 2013-11-10 MED ORDER — BUPIVACAINE HCL (PF) 0.25 % IJ SOLN
INTRAMUSCULAR | Status: DC | PRN
  Administered 2013-11-10: 30 mL

## 2013-11-10 MED ORDER — PROPOFOL 10 MG/ML IV BOLUS
INTRAVENOUS | Status: DC | PRN
  Administered 2013-11-10: 160 mg via INTRAVENOUS

## 2013-11-10 MED ORDER — FENTANYL CITRATE 0.05 MG/ML IJ SOLN
INTRAMUSCULAR | Status: AC
  Filled 2013-11-10: qty 2

## 2013-11-10 MED ORDER — FENTANYL CITRATE 0.05 MG/ML IJ SOLN
25.0000 ug | INTRAMUSCULAR | Status: DC | PRN
  Administered 2013-11-10: 50 ug via INTRAVENOUS

## 2013-11-10 MED ORDER — LIDOCAINE HCL (CARDIAC) 20 MG/ML IV SOLN
INTRAVENOUS | Status: DC | PRN
  Administered 2013-11-10: 80 mg via INTRAVENOUS

## 2013-11-10 MED ORDER — NEOSTIGMINE METHYLSULFATE 1 MG/ML IJ SOLN
INTRAMUSCULAR | Status: DC | PRN
  Administered 2013-11-10: 3 mg via INTRAVENOUS

## 2013-11-10 MED ORDER — GLYCOPYRROLATE 0.2 MG/ML IJ SOLN
INTRAMUSCULAR | Status: DC | PRN
  Administered 2013-11-10: 0.6 mg via INTRAVENOUS

## 2013-11-10 MED ORDER — DEXAMETHASONE SODIUM PHOSPHATE 10 MG/ML IJ SOLN
INTRAMUSCULAR | Status: DC | PRN
  Administered 2013-11-10: 10 mg via INTRAVENOUS

## 2013-11-10 MED ORDER — KETOROLAC TROMETHAMINE 30 MG/ML IJ SOLN
INTRAMUSCULAR | Status: AC
  Filled 2013-11-10: qty 1

## 2013-11-10 MED ORDER — LACTATED RINGERS IR SOLN
Status: DC | PRN
  Administered 2013-11-10: 3000 mL

## 2013-11-10 MED ORDER — OXYCODONE-ACETAMINOPHEN 5-325 MG PO TABS
1.0000 | ORAL_TABLET | ORAL | Status: DC | PRN
  Administered 2013-11-10: 1 via ORAL

## 2013-11-10 MED ORDER — IBUPROFEN 600 MG PO TABS: 600.0000 mg | ORAL_TABLET | Freq: Four times a day (QID) | ORAL | Status: DC | PRN

## 2013-11-10 MED ORDER — KETOROLAC TROMETHAMINE 30 MG/ML IJ SOLN: 15.0000 mg | Freq: Once | INTRAMUSCULAR | Status: DC | PRN

## 2013-11-10 MED ORDER — MIDAZOLAM HCL 2 MG/2ML IJ SOLN
INTRAMUSCULAR | Status: DC | PRN
Start: 2013-11-10 — End: 2013-11-10
  Administered 2013-11-10: 2 mg via INTRAVENOUS

## 2013-11-10 MED ORDER — MEPERIDINE HCL 25 MG/ML IJ SOLN
INTRAMUSCULAR | Status: AC
  Administered 2013-11-10: 6.25 mg via INTRAVENOUS
  Filled 2013-11-10: qty 1

## 2013-11-10 MED ORDER — KETOROLAC TROMETHAMINE 30 MG/ML IJ SOLN
INTRAMUSCULAR | Status: DC | PRN
  Administered 2013-11-10: 30 mg via INTRAVENOUS

## 2013-11-10 MED ORDER — PROPOFOL 10 MG/ML IV EMUL
INTRAVENOUS | Status: AC
  Filled 2013-11-10: qty 20

## 2013-11-10 MED ORDER — ROCURONIUM BROMIDE 100 MG/10ML IV SOLN
INTRAVENOUS | Status: DC | PRN
  Administered 2013-11-10: 30 mg via INTRAVENOUS

## 2013-11-10 MED ORDER — FENTANYL CITRATE 0.05 MG/ML IJ SOLN
INTRAMUSCULAR | Status: DC | PRN
  Administered 2013-11-10 (×2): 50 ug via INTRAVENOUS
  Administered 2013-11-10: 100 ug via INTRAVENOUS

## 2013-11-10 MED ORDER — OXYCODONE-ACETAMINOPHEN 5-325 MG PO TABS
ORAL_TABLET | ORAL | Status: AC
  Filled 2013-11-10: qty 1

## 2013-11-10 MED ORDER — OXYCODONE-ACETAMINOPHEN 5-325 MG PO TABS: 2.0000 | ORAL_TABLET | ORAL | Status: DC | PRN

## 2013-11-10 MED ORDER — BUPIVACAINE HCL (PF) 0.25 % IJ SOLN
INTRAMUSCULAR | Status: AC
  Filled 2013-11-10: qty 30

## 2013-11-10 MED ORDER — LACTATED RINGERS IV SOLN
INTRAVENOUS | Status: DC
  Administered 2013-11-10 (×2): via INTRAVENOUS

## 2013-11-10 MED ORDER — DEXAMETHASONE SODIUM PHOSPHATE 10 MG/ML IJ SOLN
INTRAMUSCULAR | Status: AC
  Filled 2013-11-10: qty 1

## 2013-11-10 MED ORDER — LACTATED RINGERS IV SOLN
INTRAVENOUS | Status: DC
  Administered 2013-11-10: 11:00:00 via INTRAVENOUS

## 2013-11-10 MED ORDER — ONDANSETRON HCL 4 MG/2ML IJ SOLN
INTRAMUSCULAR | Status: DC | PRN
  Administered 2013-11-10: 4 mg via INTRAVENOUS

## 2013-11-10 MED ORDER — MIDAZOLAM HCL 2 MG/2ML IJ SOLN
INTRAMUSCULAR | Status: AC
  Filled 2013-11-10: qty 2

## 2013-11-10 MED ORDER — PHENYLEPHRINE 40 MCG/ML (10ML) SYRINGE FOR IV PUSH (FOR BLOOD PRESSURE SUPPORT)
PREFILLED_SYRINGE | INTRAVENOUS | Status: AC
  Filled 2013-11-10: qty 5

## 2013-11-10 SURGICAL SUPPLY — 17 items
CATH ROBINSON RED A/P 16FR (CATHETERS) ×2 IMPLANT
CLIP FILSHIE TUBAL LIGA STRL (Clip) ×2 IMPLANT
CLOTH BEACON ORANGE TIMEOUT ST (SAFETY) ×2 IMPLANT
DERMABOND ADVANCED (GAUZE/BANDAGES/DRESSINGS) ×1
DERMABOND ADVANCED .7 DNX12 (GAUZE/BANDAGES/DRESSINGS) ×1 IMPLANT
GLOVE BIO SURGEON STRL SZ7 (GLOVE) ×2 IMPLANT
GLOVE BIOGEL PI IND STRL 7.0 (GLOVE) ×1 IMPLANT
GLOVE BIOGEL PI INDICATOR 7.0 (GLOVE) ×1
PACK LAPAROSCOPY BASIN (CUSTOM PROCEDURE TRAY) ×2 IMPLANT
SET IRRIG TUBING LAPAROSCOPIC (IRRIGATION / IRRIGATOR) ×2 IMPLANT
SUT VICRYL 0 UR6 27IN ABS (SUTURE) ×2 IMPLANT
SUT VICRYL RAPIDE 3 0 (SUTURE) ×2 IMPLANT
TOWEL OR 17X24 6PK STRL BLUE (TOWEL DISPOSABLE) ×4 IMPLANT
TROCAR BALLN 12MMX100 BLUNT (TROCAR) ×2 IMPLANT
TROCAR XCEL NON-BLD 11X100MML (ENDOMECHANICALS) ×2 IMPLANT
WARMER LAPAROSCOPE (MISCELLANEOUS) ×2 IMPLANT
WATER STERILE IRR 1000ML POUR (IV SOLUTION) IMPLANT

## 2013-11-10 NOTE — Anesthesia Postprocedure Evaluation (Signed)
  Anesthesia Post Note  Patient: Brenda Blankenship  Procedure(s) Performed: Procedure(s) (LRB): bilateral LAPAROSCOPIC TUBAL LIGATION with filshie clips (Bilateral)  Anesthesia type: GA  Patient location: PACU  Post pain: Pain level controlled  Post assessment: Post-op Vital signs reviewed  Last Vitals:  Filed Vitals:   11/10/13 1006  BP: 151/95  Pulse: 65  Temp: 36.7 C  Resp: 18    Post vital signs: Reviewed  Level of consciousness: sedated  Complications: No apparent anesthesia complications

## 2013-11-10 NOTE — Op Note (Signed)
Pre-Operative Diagnosis: 1) desired permanent sterilization Postoperative Diagnosis: Same Procedure: Laparoscopic bilateral tubal ligation with Filshie clips Surgeon: Dr. Waynard ReedsKendra Brylei Pedley Assistant:None Anesthesia: Gen. Endotracheal anesthesia and 10 cc of 0.25% Marcaine injected infraumbilically. Operative Findings: Adhesive disease involving the left fallopian tube, posterior pelvis with the uterus. Violin string adhesions between the anterior abdominal wall and the capsule of the liver. The tip of the Hulka uterine manipulator was noted to have perforated the lower uterine segment of the uterus. The pelvis was irrigated at the end of the procedure and the perforation site was hemostatic. Specimen:None ZOX:WRUEAVWEBL:Minimal  Procedure: Ms. Brenda Blankenship is an [age] year old multigravid female who presents for desired permanent sterilization. Risks benefits and alternatives of the procedure were discussed at length with the patient and she wishes to proceed. Following the appropriate informed consent the patient was taken to the operating room. She was placed in dorsal lithotomy position, and general anesthesia was administered. The abdomen, perineum, and vagina, were prepped in the normal sterile fashion. Speculum was placed in the vagina and a Hulka uterine manipulator was placed transcervically. The speculum was removed from the vagina. Attention was then turned to the abdominal portion of the case. Following the appropriate draping, 10 cc of 0.25% Marcaine was injected infraumbilically. Allis clamps were used to grasp and elevate the skin. A scalpel was used to make a verticle infraumbilical incision. Entry was first attempted with the veres needle, however poor insufflation occurred. Blunt dissection was used down to the level of the fascia. The fascia was grasped with Coker clamps x2 and tented up and fascia was entered using Mayo scissors. Intra-abdominal and she was confirmed with palpation. A Hassan port was placed, the  balloon inflated, and pneumoperitoneum was achieved. The intra-abdominal cavity was inspected and findings were as above. The hulka manipulator was removed and a sponge stick was replaced in the vagina. The uterus was elevated and the patient was placed in Trendelenburg. The Filshie clip applicator was introduced using the operative scope. The right fallopian tube was identified and a Filshie clip was applied circumferentially to the fallopian tube. The same procedure was repeated on the left side. The abdominal cavity was then irrigated. This completed the procedure. The abdomen was desufflated, the [facility] port was removed, the fascia was closed with #1 Vicryl in a running fashion, the skin was closed in a subcuticular fashion, and Dermabond was placed. A sponge stick was removed from the vagina. All sponge lap needle counts were correct x2.  The patient tolerated the procedure well and was brought to the recovery room in stable condition

## 2013-11-10 NOTE — Anesthesia Preprocedure Evaluation (Signed)
Anesthesia Evaluation  Patient identified by MRN, date of birth, ID band Patient awake    Reviewed: Allergy & Precautions, H&P , Patient's Chart, lab work & pertinent test results, reviewed documented beta blocker date and time   History of Anesthesia Complications Negative for: history of anesthetic complications  Airway Mallampati: II TM Distance: >3 FB Neck ROM: full    Dental   Pulmonary former smoker,  breath sounds clear to auscultation        Cardiovascular Exercise Tolerance: Good Rhythm:regular Rate:Normal     Neuro/Psych PSYCHIATRIC DISORDERS Anxiety    GI/Hepatic   Endo/Other    Renal/GU      Musculoskeletal   Abdominal   Peds  Hematology  (+) anemia ,   Anesthesia Other Findings   Reproductive/Obstetrics                           Anesthesia Physical Anesthesia Plan  ASA: II  Anesthesia Plan: General ETT   Post-op Pain Management:    Induction:   Airway Management Planned:   Additional Equipment:   Intra-op Plan:   Post-operative Plan:   Informed Consent: I have reviewed the patients History and Physical, chart, labs and discussed the procedure including the risks, benefits and alternatives for the proposed anesthesia with the patient or authorized representative who has indicated his/her understanding and acceptance.   Dental Advisory Given  Plan Discussed with: CRNA and Surgeon  Anesthesia Plan Comments:         Anesthesia Quick Evaluation

## 2013-11-10 NOTE — Discharge Instructions (Signed)

## 2013-11-10 NOTE — Transfer of Care (Signed)
Immediate Anesthesia Transfer of Care Note  Patient: Brenda Blankenship  Procedure(s) Performed: Procedure(s): bilateral LAPAROSCOPIC TUBAL LIGATION with filshie clips (Bilateral)  Patient Location: PACU  Anesthesia Type:General  Level of Consciousness: awake, alert  and oriented  Airway & Oxygen Therapy: Patient Spontanous Breathing and Patient connected to nasal cannula oxygen  Post-op Assessment: Report given to PACU RN, Post -op Vital signs reviewed and stable and Patient moving all extremities  Post vital signs: Reviewed and stable  Complications: No apparent anesthesia complications

## 2013-11-10 NOTE — H&P (Signed)
Brenda Blankenship is an 28 y.o. female. Presents for laparoscopic tubal ligation for desired permanent sterilization  28 yo presents for l/s tubal ligation for desired permanent sterilization. R/B/A of tubal sterilization discussed with patient, including but not limited to bleeding, infection, damage to internal organs, development of DVT, tubal failure of 3-11/998/year, 30% risk of ectopic if tubal failure occurs, and regretting the decision. Pt wishes to proceed   No LMP recorded.    Past Medical History  Diagnosis Date  . Environmental allergies   . Panic attacks   . Preterm labor   . Anemia   . Abnormal cervical Papanicolaou smear     cryo    Past Surgical History  Procedure Laterality Date  . Cryotherapy      Family History  Problem Relation Age of Onset  . Hypertension Mother   . Hypertension Father   . Hyperlipidemia Father   . Diabetes Maternal Grandmother   . Cancer Paternal Grandmother     started in lung  . Hypertension Paternal Grandmother     Social History:  reports that she has quit smoking. Her smoking use included Cigarettes. She has a 1.25 pack-year smoking history. She has never used smokeless tobacco. She reports that she uses illicit drugs (Marijuana). She reports that she does not drink alcohol.  Allergies:  Allergies  Allergen Reactions  . Hydrocodone     Fainting, lightheadness    No prescriptions prior to admission    ROS: as above  Height 5\' 3"  (1.6 m), weight 70.761 kg (156 lb), unknown if currently breastfeeding. Physical Exam  AOX3 Abd soft NEFG  No results found for this or any previous visit (from the past 24 hour(s)).  No results found.  Assessment/Plan: 1) Admit 2) Proceed with BTL  Tori Cupps H. Ninetta Adelstein 11/10/2013, 8:30 AM

## 2013-11-13 ENCOUNTER — Inpatient Hospital Stay (HOSPITAL_COMMUNITY)
Admission: AD | Admit: 2013-11-13 | Discharge: 2013-11-13 | Disposition: A | Discharge status: Home / Self Care | Payer: BC Managed Care – PPO | Source: Ambulatory Visit | Attending: Obstetrics and Gynecology | Admitting: Obstetrics and Gynecology

## 2013-11-13 ENCOUNTER — Encounter (HOSPITAL_COMMUNITY): Payer: Self-pay | Admitting: *Deleted

## 2013-11-13 ENCOUNTER — Inpatient Hospital Stay (HOSPITAL_COMMUNITY): Payer: BC Managed Care – PPO

## 2013-11-13 DIAGNOSIS — K5900 Constipation, unspecified: Secondary | ICD-10-CM | POA: Insufficient documentation

## 2013-11-13 DIAGNOSIS — F17200 Nicotine dependence, unspecified, uncomplicated: Secondary | ICD-10-CM | POA: Insufficient documentation

## 2013-11-13 DIAGNOSIS — G8918 Other acute postprocedural pain: Secondary | ICD-10-CM | POA: Insufficient documentation

## 2013-11-13 DIAGNOSIS — N949 Unspecified condition associated with female genital organs and menstrual cycle: Secondary | ICD-10-CM | POA: Insufficient documentation

## 2013-11-13 HISTORY — DX: Gestational (pregnancy-induced) hypertension without significant proteinuria, unspecified trimester: O13.9

## 2013-11-13 LAB — URINALYSIS, ROUTINE W REFLEX MICROSCOPIC
Bilirubin Urine: NEGATIVE
Glucose, UA: NEGATIVE mg/dL
Ketones, ur: NEGATIVE mg/dL
Leukocytes, UA: NEGATIVE
Nitrite: NEGATIVE
Protein, ur: NEGATIVE mg/dL
Specific Gravity, Urine: <1.005 — ABNORMAL LOW (ref 1.005–1.030)
Urobilinogen, UA: 0.2 mg/dL (ref 0.0–1.0)
pH: 6 (ref 5.0–8.0)

## 2013-11-13 LAB — CBC
HCT: 39.4 % (ref 36.0–46.0)
Hemoglobin: 13.2 g/dL (ref 12.0–15.0)
MCH: 27.8 pg (ref 26.0–34.0)
MCHC: 33.5 g/dL (ref 30.0–36.0)
MCV: 83.1 fL (ref 78.0–100.0)
Platelets: 228 10*3/uL (ref 150–400)
RBC: 4.74 MIL/uL (ref 3.87–5.11)
RDW: 12.2 % (ref 11.5–15.5)
WBC: 4.6 10*3/uL (ref 4.0–10.5)

## 2013-11-13 LAB — COMPREHENSIVE METABOLIC PANEL
ALT: 11 U/L (ref 0–35)
AST: 13 U/L (ref 0–37)
Albumin: 3.5 g/dL (ref 3.5–5.2)
Alkaline Phosphatase: 50 U/L (ref 39–117)
BUN: 9 mg/dL (ref 6–23)
CO2: 27 mEq/L (ref 19–32)
Calcium: 8.9 mg/dL (ref 8.4–10.5)
Chloride: 102 mEq/L (ref 96–112)
Creatinine, Ser: 0.91 mg/dL (ref 0.50–1.10)
GFR calc Af Amer: >90 mL/min (ref >90)
GFR calc non Af Amer: 86 mL/min — ABNORMAL LOW (ref >90)
Glucose, Bld: 91 mg/dL (ref 70–99)
Potassium: 4.2 mEq/L (ref 3.7–5.3)
Sodium: 140 mEq/L (ref 137–147)
Total Bilirubin: 0.4 mg/dL (ref 0.3–1.2)
Total Protein: 6.3 g/dL (ref 6.0–8.3)

## 2013-11-13 LAB — URINE MICROSCOPIC-ADD ON

## 2013-11-13 MED ORDER — IOHEXOL 300 MG/ML  SOLN
50.0000 mL | INTRAMUSCULAR | Status: AC
  Administered 2013-11-13: 50 mL via ORAL

## 2013-11-13 MED ORDER — HYDROMORPHONE HCL PF 1 MG/ML IJ SOLN
1.0000 mg | Freq: Once | INTRAMUSCULAR | Status: AC
  Administered 2013-11-13: 1 mg via INTRAVENOUS
  Filled 2013-11-13: qty 1

## 2013-11-13 MED ORDER — HYDROMORPHONE HCL PF 1 MG/ML IJ SOLN: 1.0000 mg | Freq: Once | INTRAMUSCULAR | Status: DC

## 2013-11-13 MED ORDER — IOHEXOL 300 MG/ML  SOLN
100.0000 mL | Freq: Once | INTRAMUSCULAR | Status: AC | PRN
  Administered 2013-11-13: 100 mL via INTRAVENOUS

## 2013-11-13 MED ORDER — POLYETHYLENE GLYCOL 3350 17 GM/SCOOP PO POWD: 1.0000 | Freq: Once | ORAL | Status: DC

## 2013-11-13 MED ORDER — GLYCERIN (LAXATIVE) 2.1 G RE SUPP
1.0000 | Freq: Once | RECTAL | Status: AC
  Administered 2013-11-13: 1 via RECTAL
  Filled 2013-11-13: qty 1

## 2013-11-13 MED ORDER — MAGNESIUM HYDROXIDE 400 MG/5ML PO SUSP: 5.0000 mL | Freq: Every day | ORAL | Status: DC | PRN

## 2013-11-13 MED ORDER — LACTATED RINGERS IV SOLN
INTRAVENOUS | Status: DC
  Administered 2013-11-13: 11:00:00 via INTRAVENOUS

## 2013-11-13 NOTE — Discharge Instructions (Signed)
Constipation, Adult  Constipation is when a person:  · Poops (bowel movement) less than 3 times a week.  · Has a hard time pooping.  · Has poop that is dry, hard, or bigger than normal.  HOME CARE   · Eat more fiber, such as fruits, vegetables, whole grains like brown rice, and beans.  · Eat less fatty foods and sugar. This includes French fries, hamburgers, cookies, candy, and soda.  · If you are not getting enough fiber from food, take products with added fiber in them (supplements).  · Drink enough fluid to keep your pee (urine) clear or pale yellow.  · Go to the restroom when you feel like you need to poop. Do not hold it.  · Only take medicine as told by your doctor. Do not take medicines that help you poop (laxatives) without talking to your doctor first.  · Exercise on a regular basis, or as told by your doctor.  GET HELP RIGHT AWAY IF:   · You have bright red blood in your poop (stool).  · Your constipation lasts more than 4 days or gets worse.  · You have belly (abdomen) or butt (rectal) pain.  · You have thin poop (as thin as a pencil).  · You lose weight, and it cannot be explained.  MAKE SURE YOU:   · Understand these instructions.  · Will watch your condition.  · Will get help right away if you are not doing well or get worse.  Document Released: 12/30/2007 Document Revised: 10/05/2011 Document Reviewed: 04/24/2013  ExitCare® Patient Information ©2014 [employer].

## 2013-11-13 NOTE — MAU Provider Note (Signed)
History     CSN: [id number]  Arrival date and time: 11/13/13 1478   First Provider Initiated Contact with Patient 11/13/13 [phone]      Chief Complaint  Patient presents with  . Pelvic Pain   HPI Ms. Brenda Blankenship is a 28 y.o. [id number] who presents to MAU today with complaint of pain. The patient had BTL with Filshie clips performed on 11/10/13. She states that pain has been constant since surgery and is worsening. She continues to have vaginal bleeding, mild to moderate today. She is using 2-3 pads per day. She denies fever, N/V, vaginal discharge, odor or swelling or drainage of her surgical sites. She does endorse constipation and states no BM since Thursday. Pain is worse when she has gas. She states that it feels like a pulling even without her movement in her lower abdomen. She rates pain at 8-9/10 now.   OB History   Grav Para Term Preterm Abortions TAB SAB Ect Mult Living   2 2 2  0 0 0 0 0 0 2      Past Medical History  Diagnosis Date  . Environmental allergies   . Panic attacks   . Preterm labor   . Anemia   . Abnormal cervical Papanicolaou smear     cryo  . Pregnancy induced hypertension     Past Surgical History  Procedure Laterality Date  . Cryotherapy    . Tubal ligation      Family History  Problem Relation Age of Onset  . Hypertension Mother   . Hypertension Father   . Hyperlipidemia Father   . Diabetes Maternal Grandmother   . Cancer Paternal Grandmother     started in lung  . Hypertension Paternal Grandmother     History  Substance Use Topics  . Smoking status: Current Every Day Smoker -- 0.25 packs/day for 5 years    Types: Cigarettes  . Smokeless tobacco: Never Used     Comment: stopped with preg  . Alcohol Use: No    Allergies:  Allergies  Allergen Reactions  . Hydrocodone     Fainting, lightheadness    Prescriptions prior to admission  Medication Sig Dispense Refill  . ibuprofen (ADVIL,MOTRIN) 600 MG tablet Take 1 tablet (600 mg  total) by mouth every 6 (six) hours as needed.  90 tablet  0    Review of Systems  Constitutional: Negative for fever and malaise/fatigue.  Gastrointestinal: Positive for abdominal pain. Negative for nausea, vomiting and diarrhea.  Genitourinary: Negative for dysuria, urgency and frequency.       + vaginal bleeding Neg - vaginal discharge   Physical Exam   Blood pressure 164/110, pulse 72, temperature 97.9 F (36.6 C), temperature source Oral, resp. rate 18, height 5\' 3"  (1.6 m), weight 67.132 kg (148 lb), last menstrual period 11/03/2013, unknown if currently breastfeeding.  Physical Exam  Constitutional: She is oriented to person, place, and time. She appears well-developed and well-nourished. No distress.  HENT:  Head: Normocephalic and atraumatic.  Cardiovascular: Normal rate.   Respiratory: Effort normal.  GI: Soft. Bowel sounds are normal. She exhibits no distension and no mass. There is tenderness (diffuse tenderness to palpation worse in the lower quadrants). There is no rebound and no guarding.  Neurological: She is alert and oriented to person, place, and time.  Skin: Skin is warm and dry. No erythema.  Psychiatric: She has a normal mood and affect.   Results for orders placed during the hospital encounter of 11/13/13 (from  the past 24 hour(s))  CBC     Status: None   Collection Time    11/13/13  9:25 AM      Result Value Ref Range   WBC 4.6  4.0 - 10.5 K/uL   RBC 4.74  3.87 - 5.11 MIL/uL   Hemoglobin 13.2  12.0 - 15.0 g/dL   HCT 40.939.4  81.136.0 - 91.446.0 %   MCV 83.1  78.0 - 100.0 fL   MCH 27.8  26.0 - 34.0 pg   MCHC 33.5  30.0 - 36.0 g/dL   RDW 78.212.2  95.611.5 - 21.315.5 %   Platelets 228  150 - 400 K/uL  COMPREHENSIVE METABOLIC PANEL     Status: Abnormal   Collection Time    11/13/13  9:25 AM      Result Value Ref Range   Sodium 140  137 - 147 mEq/L   Potassium 4.2  3.7 - 5.3 mEq/L   Chloride 102  96 - 112 mEq/L   CO2 27  19 - 32 mEq/L   Glucose, Bld 91  70 - 99 mg/dL    BUN 9  6 - 23 mg/dL   Creatinine, Ser 0.860.91  0.50 - 1.10 mg/dL   Calcium 8.9  8.4 - 57.810.5 mg/dL   Total Protein 6.3  6.0 - 8.3 g/dL   Albumin 3.5  3.5 - 5.2 g/dL   AST 13  0 - 37 U/L   ALT 11  0 - 35 U/L   Alkaline Phosphatase 50  39 - 117 U/L   Total Bilirubin 0.4  0.3 - 1.2 mg/dL   GFR calc non Af Amer 86 (*) >90 mL/min   GFR calc Af Amer >90  >90 mL/min   Ct Abdomen Pelvis W Contrast  11/13/2013   CLINICAL DATA:  Low pelvic pain. Previous tubal ligation with postoperative symptoms.  EXAM: CT ABDOMEN AND PELVIS WITH CONTRAST  TECHNIQUE: Multidetector CT imaging of the abdomen and pelvis was performed using the standard protocol following bolus administration of intravenous contrast.  CONTRAST:  1 OMNIPAQUE IOHEXOL 300 MG/ML SOLN, 100mL OMNIPAQUE IOHEXOL 300 MG/ML SOLN  COMPARISON:  01/07/2010  FINDINGS: There is linear scarring or atelectasis at the right lung base. No pleural or pericardial fluid.  There is free intraperitoneal air, not unexpected falling laparoscopic surgery 3 days ago. There is a small amount of free fluid in the pelvic cul-de-sac with density that does not suggest gross hematoma. Bilateral densities related to tubal ligation are present, unremarkable in appearance. The uterus appears normal.  The liver, gallbladder, spleen, pancreas, adrenal glands and kidneys are normal. The aorta and IVC are normal. The appendix is normal. No other bowel pathology is seen. No hemorrhage seen related to the portals. No evidence of bladder injury.  IMPRESSION: Free intraperitoneal air, not unexpected 3 days following laparoscopic surgery.  Small amount of free fluid in the pelvic cul-de-sac with fairly low density. This does not look like gross blood.  Bilateral tubal ligation devices grossly well positioned.  No acute bowel pathology.   Electronically Signed   By: Paulina FusiMark  Shogry M.D.   On: 11/13/2013 11:47    MAU Course  Procedures None  MDM Discussed with Dr. Tenny Crawoss. Orders entered by Dr. Tenny Crawoss  for CBC, CMP and CT scan abdomen pelvis with contrast. Discussed elevated BP and patient's pain with exam. 1 mg Dilaudid ordered.  CT results reviewed with Dr. Tenny Crawoss. Order suppository.  Patient given suppository and able to have small BM in MAU.  Minimal improvement in pain Most likely source of post op pain is constipation and gas. Discussed with Dr. Tenny Crawoss. Give Rx for Miralax and Milk of magnesia   Upon discharge patient requests information about return to work. Discussed with Dr. Tenny Crawoss. Return to work note given for 11/20/13. Patient will be contacted with follow-up in the office for later this week.  Patient appears much more comfortable at time of discharge and was able to have another BM prior to leaving MAU.  Assessment and Plan  A: Post operative pain Constipation  P:  Discharge home Rx for Miralax and milk of magnesia sent to patient's pharmacy Discussed Ibuprofen PRN for pain Patient encouraged to increase PO hydration as tolerated Patient advised to follow-up with Dr. Tenny Crawoss for routine post-operative appointment Patient may return to MAU as needed or if her condition were to change or worsen  Freddi StarrJulie N Ethier, PA-C  11/13/2013, 10:14 AM

## 2013-11-13 NOTE — MAU Note (Signed)
Pt had BTL on 4/17 by Dr. Tenny Crawoss, has continued to have pelvic pain & pressure, pulling sensation at the bottom.  Pain & pressure is worse today, was suppossed to return to work today, but is having too much pain.  Small amount of vaginal bleeding.  No BM since surgery, passing some gas.

## 2013-11-20 ENCOUNTER — Emergency Department (HOSPITAL_COMMUNITY)
Admission: EM | Admit: 2013-11-20 | Discharge: 2013-11-20 | Disposition: A | Discharge status: Home / Self Care | Payer: BC Managed Care – PPO | Attending: Emergency Medicine | Admitting: Emergency Medicine

## 2013-11-20 ENCOUNTER — Emergency Department (HOSPITAL_COMMUNITY): Payer: BC Managed Care – PPO

## 2013-11-20 ENCOUNTER — Encounter (HOSPITAL_COMMUNITY): Payer: Self-pay | Admitting: Emergency Medicine

## 2013-11-20 DIAGNOSIS — F17200 Nicotine dependence, unspecified, uncomplicated: Secondary | ICD-10-CM | POA: Insufficient documentation

## 2013-11-20 DIAGNOSIS — Z9851 Tubal ligation status: Secondary | ICD-10-CM | POA: Insufficient documentation

## 2013-11-20 DIAGNOSIS — Z862 Personal history of diseases of the blood and blood-forming organs and certain disorders involving the immune mechanism: Secondary | ICD-10-CM | POA: Insufficient documentation

## 2013-11-20 DIAGNOSIS — Z8659 Personal history of other mental and behavioral disorders: Secondary | ICD-10-CM | POA: Insufficient documentation

## 2013-11-20 DIAGNOSIS — R1031 Right lower quadrant pain: Secondary | ICD-10-CM | POA: Insufficient documentation

## 2013-11-20 DIAGNOSIS — R1032 Left lower quadrant pain: Secondary | ICD-10-CM | POA: Insufficient documentation

## 2013-11-20 DIAGNOSIS — R079 Chest pain, unspecified: Secondary | ICD-10-CM

## 2013-11-20 DIAGNOSIS — R42 Dizziness and giddiness: Secondary | ICD-10-CM | POA: Insufficient documentation

## 2013-11-20 DIAGNOSIS — R072 Precordial pain: Secondary | ICD-10-CM | POA: Insufficient documentation

## 2013-11-20 LAB — CBC WITH DIFFERENTIAL/PLATELET
Basophils Absolute: 0 10*3/uL (ref 0.0–0.1)
Basophils Relative: 1 % (ref 0–1)
Eosinophils Absolute: 0.1 10*3/uL (ref 0.0–0.7)
Eosinophils Relative: 3 % (ref 0–5)
HCT: 36.9 % (ref 36.0–46.0)
Hemoglobin: 12.4 g/dL (ref 12.0–15.0)
Lymphocytes Relative: 53 % — ABNORMAL HIGH (ref 12–46)
Lymphs Abs: 1.9 10*3/uL (ref 0.7–4.0)
MCH: 28 pg (ref 26.0–34.0)
MCHC: 33.6 g/dL (ref 30.0–36.0)
MCV: 83.3 fL (ref 78.0–100.0)
Monocytes Absolute: 0.5 10*3/uL (ref 0.1–1.0)
Monocytes Relative: 13 % — ABNORMAL HIGH (ref 3–12)
Neutro Abs: 1.1 10*3/uL — ABNORMAL LOW (ref 1.7–7.7)
Neutrophils Relative %: 30 % — ABNORMAL LOW (ref 43–77)
Platelets: 236 10*3/uL (ref 150–400)
RBC: 4.43 MIL/uL (ref 3.87–5.11)
RDW: 12.6 % (ref 11.5–15.5)
WBC: 3.6 10*3/uL — ABNORMAL LOW (ref 4.0–10.5)

## 2013-11-20 LAB — BASIC METABOLIC PANEL
BUN: 6 mg/dL (ref 6–23)
CO2: 25 mEq/L (ref 19–32)
Calcium: 9 mg/dL (ref 8.4–10.5)
Chloride: 103 mEq/L (ref 96–112)
Creatinine, Ser: 0.88 mg/dL (ref 0.50–1.10)
GFR calc Af Amer: >90 mL/min (ref >90)
GFR calc non Af Amer: 89 mL/min — ABNORMAL LOW (ref >90)
Glucose, Bld: 81 mg/dL (ref 70–99)
Potassium: 4 mEq/L (ref 3.7–5.3)
Sodium: 140 mEq/L (ref 137–147)

## 2013-11-20 LAB — POC OCCULT BLOOD, ED: Fecal Occult Bld: NEGATIVE

## 2013-11-20 LAB — I-STAT TROPONIN, ED: Troponin i, poc: 0 ng/mL (ref 0.00–0.08)

## 2013-11-20 LAB — D-DIMER, QUANTITATIVE: D-Dimer, Quant: 0.32 ug/mL-FEU (ref 0.00–0.48)

## 2013-11-20 LAB — PRO B NATRIURETIC PEPTIDE: Pro B Natriuretic peptide (BNP): 195.8 pg/mL — ABNORMAL HIGH (ref 0–125)

## 2013-11-20 MED ORDER — TRAMADOL HCL 50 MG PO TABS: 50.0000 mg | ORAL_TABLET | Freq: Four times a day (QID) | ORAL | Status: DC | PRN

## 2013-11-20 MED ORDER — MORPHINE SULFATE 4 MG/ML IJ SOLN
4.0000 mg | Freq: Once | INTRAMUSCULAR | Status: AC
  Administered 2013-11-20: 4 mg via INTRAVENOUS
  Filled 2013-11-20: qty 1

## 2013-11-20 MED ORDER — SODIUM CHLORIDE 0.9 % IV BOLUS (SEPSIS)
1000.0000 mL | Freq: Once | INTRAVENOUS | Status: AC
  Administered 2013-11-20: 1000 mL via INTRAVENOUS

## 2013-11-20 NOTE — ED Notes (Signed)
Per EMS: Pt reports substernal sharp CP x 1 hour with SOB, dizziness. Pt reports pain worse with inspiration. Tubal ligation 2 weeks ago, states "they punctured my uterus". Pt also reports black, tarry stools. EKG unremarkable. 150/90. 70 NSR. 100% RA lungs clear.

## 2013-11-20 NOTE — Discharge Instructions (Signed)
Please follow up with your primary care physician in 1-2 days. If you do not have one please call the [facility] number listed above. Please take pain medication and/or muscle relaxants as prescribed and as needed for pain. Please do not drive on narcotic pain medication or on muscle relaxants. Please read all discharge instructions and return precautions.    Chest Pain (Nonspecific) It is often hard to give a specific diagnosis for the cause of chest pain. There is always a chance that your pain could be related to something serious, such as a heart attack or a blood clot in the lungs. You need to follow up with your caregiver for further evaluation. CAUSES   Heartburn.  Pneumonia or bronchitis.  Anxiety or stress.  Inflammation around your heart (pericarditis) or lung (pleuritis or pleurisy).  A blood clot in the lung.  A collapsed lung (pneumothorax). It can develop suddenly on its own (spontaneous pneumothorax) or from injury (trauma) to the chest.  Shingles infection (herpes zoster virus). The chest wall is composed of bones, muscles, and cartilage. Any of these can be the source of the pain.  The bones can be bruised by injury.  The muscles or cartilage can be strained by coughing or overwork.  The cartilage can be affected by inflammation and become sore (costochondritis). DIAGNOSIS  Lab tests or other studies, such as X-rays, electrocardiography, stress testing, or cardiac imaging, may be needed to find the cause of your pain.  TREATMENT   Treatment depends on what may be causing your chest pain. Treatment may include:  Acid blockers for heartburn.  Anti-inflammatory medicine.  Pain medicine for inflammatory conditions.  Antibiotics if an infection is present.  You may be advised to change lifestyle habits. This includes stopping smoking and avoiding alcohol, caffeine, and chocolate.  You may be advised to keep your head raised (elevated) when  sleeping. This reduces the chance of acid going backward from your stomach into your esophagus.  Most of the time, nonspecific chest pain will improve within 2 to 3 days with rest and mild pain medicine. HOME CARE INSTRUCTIONS   If antibiotics were prescribed, take your antibiotics as directed. Finish them even if you start to feel better.  For the next few days, avoid physical activities that bring on chest pain. Continue physical activities as directed.  Do not smoke.  Avoid drinking alcohol.  Only take over-the-counter or prescription medicine for pain, discomfort, or fever as directed by your caregiver.  Follow your caregiver's suggestions for further testing if your chest pain does not go away.  Keep any follow-up appointments you made. If you do not go to an appointment, you could develop lasting (chronic) problems with pain. If there is any problem keeping an appointment, you must call to reschedule. SEEK MEDICAL CARE IF:   You think you are having problems from the medicine you are taking. Read your medicine instructions carefully.  Your chest pain does not go away, even after treatment.  You develop a rash with blisters on your chest. SEEK IMMEDIATE MEDICAL CARE IF:   You have increased chest pain or pain that spreads to your arm, neck, jaw, back, or abdomen.  You develop shortness of breath, an increasing cough, or you are coughing up blood.  You have severe back or abdominal pain, feel nauseous, or vomit.  You develop severe weakness, fainting, or chills.  You have a fever. THIS IS AN EMERGENCY. Do not wait to see if the pain  will go away. Get medical help at once. Call your local emergency services (911 in [place] Do not drive yourself to the hospital. MAKE SURE YOU:   Understand these instructions.  Will watch your condition.  Will get help right away if you are not doing well or get worse. Document Released: 04/22/2005 Document Revised: 10/05/2011 Document  Reviewed: 02/16/2008 [facility] Patient Information 2014 [employer], [place].

## 2013-11-20 NOTE — ED Provider Notes (Signed)
CSN: [id number]     Arrival date & time 11/20/13  1252 History   First MD Initiated Contact with Patient 11/20/13 1324     Chief Complaint  Patient presents with  . Chest Pain     (Consider location/radiation/quality/duration/timing/severity/associated sxs/prior Treatment) HPI Comments: Patient is a [id number] 28 year old female presenting to the ED for left-sided sharp nonradiating chest pain that began last evening and has been getting gradually worse since then. She states she's tried Motrin with no relief. Patient endorses some associated dizziness and fatigue. She states she typically has shortness of breath from tobacco use and does not believe that this is any different today. Patient states he recently underwent a tubal ligation performed by Dr. T[facility] on April 17, was having severe symptoms and 3 days later had a CT abdomen pelvis performed with no evidence of acute abdominal finding. Patient states she's also had intermittent black tarry stools over the last few days. She denies any nausea, vomiting, syncope. She denies any vaginal bleeding or discharge, hematuria.  Patient is a 28 y.o. female presenting with chest pain.  Chest Pain Associated symptoms: dizziness   Associated symptoms: no abdominal pain, no nausea and not vomiting     Past Medical History  Diagnosis Date  . Environmental allergies   . Panic attacks   . Preterm labor   . Anemia   . Abnormal cervical Papanicolaou smear     cryo  . Pregnancy induced hypertension    Past Surgical History  Procedure Laterality Date  . Cryotherapy    . Tubal ligation    . Laparoscopic tubal ligation Bilateral 11/10/2013    Procedure: bilateral LAPAROSCOPIC TUBAL LIGATION with filshie clips;  Surgeon: Freddrick MarchKendra H. Tenny Crawoss, MD;  Location: [facility];  Service: Gynecology;  Laterality: Bilateral;   Family History  Problem Relation Age of Onset  . Hypertension Mother   . Hypertension Father   . Hyperlipidemia Father    . Diabetes Maternal Grandmother   . Cancer Paternal Grandmother     started in lung  . Hypertension Paternal Grandmother    History  Substance Use Topics  . Smoking status: Current Every Day Smoker -- 0.25 packs/day for 5 years    Types: Cigarettes  . Smokeless tobacco: Never Used     Comment: stopped with preg  . Alcohol Use: No   OB History   Grav Para Term Preterm Abortions TAB SAB Ect Mult Living   2 2 2  0 0 0 0 0 0 2     Review of Systems  Cardiovascular: Positive for chest pain.  Gastrointestinal: Negative for nausea, vomiting, abdominal pain, diarrhea, constipation and anal bleeding.  Genitourinary: Negative for hematuria, vaginal bleeding and vaginal discharge.  Neurological: Positive for dizziness.  All other systems reviewed and are negative.     Allergies  Hydrocodone  Home Medications   Prior to Admission medications   Medication Sig Start Date End Date Taking? Authorizing Provider  ibuprofen (ADVIL,MOTRIN) 600 MG tablet Take 1 tablet (600 mg total) by mouth every 6 (six) hours as needed. 11/10/13   Kendra H. Tenny Crawoss, MD  magnesium hydroxide (MILK OF MAGNESIA) 400 MG/5ML suspension Take 5 mLs by mouth daily as needed. 11/13/13   Freddi StarrJulie N Ethier, PA-C  polyethylene glycol powder (GLYCOLAX/MIRALAX) powder Take 255 g (1 Container total) by mouth once. 11/13/13   Freddi StarrJulie N Ethier, PA-C   BP 144/90  Pulse 63  Temp(Src) 98.7 F (37.1 C) (Oral)  Resp 14  SpO2  100%  LMP 11/03/2013 Physical Exam  Nursing note and vitals reviewed. Constitutional: She is oriented to person, place, and time. She appears well-developed and well-nourished. No distress.  HENT:  Head: Normocephalic and atraumatic.  Right Ear: External ear normal.  Left Ear: External ear normal.  Nose: Nose normal.  Mouth/Throat: Uvula is midline and oropharynx is clear and moist. Mucous membranes are dry. No oropharyngeal exudate.  Eyes: Conjunctivae and EOM are normal.  Neck: Normal range of motion. Neck  supple.  Cardiovascular: Normal rate, regular rhythm, normal heart sounds and intact distal pulses.   Pulmonary/Chest: Effort normal and breath sounds normal. No respiratory distress. She exhibits tenderness.  Abdominal: Soft. Bowel sounds are normal. She exhibits no distension. There is tenderness (RLQ, Suprapubic, and LLQ). There is no rebound and no guarding.  Umbilical incision site CDI  Genitourinary: Rectum normal. Guaiac negative stool.  Brown stool  Musculoskeletal: Normal range of motion.     Neurological: She is alert and oriented to person, place, and time.  Moves all extremities without ataxia  Skin: Skin is warm and dry. She is not diaphoretic.  Psychiatric: She has a normal mood and affect. Her speech is normal.    ED Course  Procedures (including critical care time) Medications  sodium chloride 0.9 % bolus 1,000 mL (0 mLs Intravenous Stopped 11/20/13 1554)  morphine 4 MG/ML injection 4 mg (4 mg Intravenous Given 11/20/13 1523)    Labs Review Labs Reviewed  BASIC METABOLIC PANEL - Abnormal; Notable for the following:    GFR calc non Af Amer 89 (*)    All other components within normal limits  PRO B NATRIURETIC PEPTIDE - Abnormal; Notable for the following:    Pro B Natriuretic peptide (BNP) 195.8 (*)    All other components within normal limits  CBC WITH DIFFERENTIAL - Abnormal; Notable for the following:    WBC 3.6 (*)    Neutrophils Relative % 30 (*)    Neutro Abs 1.1 (*)    Lymphocytes Relative 53 (*)    Monocytes Relative 13 (*)    All other components within normal limits  D-DIMER, QUANTITATIVE  I-STAT TROPOININ, ED  POC OCCULT BLOOD, ED    Imaging Review Dg Chest 2 View  11/20/2013   CLINICAL DATA:  CHEST PAIN CHEST PAIN  EXAM: CHEST  2 VIEW  COMPARISON:  DG CHEST 2 VIEW dated 11/03/2012  FINDINGS: Mediastinum and hilar structures normal. Lungs are clear. Cardiac structures are unremarkable.  IMPRESSION: No acute cardiopulmonary disease.   Electronically  Signed   By: Maisie Fus  Register   On: 11/20/2013 14:13     EKG Interpretation   Date/Time:  Monday November 20 2013 12:58:59 EDT Ventricular Rate:  66 PR Interval:  164 QRS Duration: 68 QT Interval:  408 QTC Calculation: 427 R Axis:   67 Text Interpretation:  Normal sinus rhythm Normal ECG No significant change  since last tracing Confirmed by YAO  MD, DAVID ([id number]) on 11/20/2013  1:38:25 PM      MDM   Final diagnoses:  Chest pain    Filed Vitals:   11/20/13 1615  BP: 144/90  Pulse: 63  Temp:   Resp: 14    Afebrile, NAD, non-toxic appearing, AAOx4. Patient with CP that began last evening around 9PM. Patient endorses shortness of breath, no change from baseline. Patient with recent pelvic surgery, tubal ligation on April 17. Denies any vaginal bleeding or discharge. Endorses history of few black tarry stools, brown stool appreciated on DRE,  hemoccult negative. Hgb and Hct stable wnl.   Patient is to be discharged with recommendation to follow up with PCP in regards to today's hospital visit. Chest pain is not likely of cardiac or pulmonary etiology d/t presentation, d-dimer negative, VSS, no tracheal deviation, no JVD or new murmur, RRR, breath sounds equal bilaterally, EKG without acute abnormalities, negative troponin, and negative CXR. Pt has been advised to return to the ED is CP becomes exertional, associated with diaphoresis or nausea, radiates to left jaw/arm, worsens or becomes concerning in any way. Pt appears reliable for follow up and is agreeable to discharge.   Case has been discussed with Dr. Silverio LayYao who agrees with the above plan to discharge.    Jeannetta EllisJennifer L Jaselyn Nahm, PA-C 11/20/13 1630

## 2013-11-21 NOTE — ED Provider Notes (Signed)
Medical screening examination/treatment/procedure(s) were performed by non-physician practitioner and as supervising physician I was immediately available for consultation/collaboration.   EKG Interpretation   Date/Time:  Monday November 20 2013 12:58:59 EDT Ventricular Rate:  66 PR Interval:  164 QRS Duration: 68 QT Interval:  408 QTC Calculation: 427 R Axis:   67 Text Interpretation:  Normal sinus rhythm Normal ECG No significant change  since last tracing Confirmed by Joyann Spidle  MD, Man Effertz ([id number]) on 11/20/2013  1:38:25 PM        Richardean Canalavid H Tajuan Dufault, MD 11/21/13 0730

## 2013-11-29 ENCOUNTER — Inpatient Hospital Stay (HOSPITAL_COMMUNITY)
Admission: AD | Admit: 2013-11-29 | Discharge: 2013-11-29 | Disposition: A | Discharge status: Home / Self Care | Payer: BC Managed Care – PPO | Source: Ambulatory Visit | Attending: Obstetrics and Gynecology | Admitting: Obstetrics and Gynecology

## 2013-11-29 ENCOUNTER — Inpatient Hospital Stay (HOSPITAL_COMMUNITY): Payer: BC Managed Care – PPO

## 2013-11-29 ENCOUNTER — Encounter (HOSPITAL_COMMUNITY): Payer: Self-pay

## 2013-11-29 DIAGNOSIS — F17200 Nicotine dependence, unspecified, uncomplicated: Secondary | ICD-10-CM | POA: Insufficient documentation

## 2013-11-29 DIAGNOSIS — R109 Unspecified abdominal pain: Secondary | ICD-10-CM | POA: Insufficient documentation

## 2013-11-29 DIAGNOSIS — N949 Unspecified condition associated with female genital organs and menstrual cycle: Secondary | ICD-10-CM | POA: Insufficient documentation

## 2013-11-29 LAB — URINALYSIS, ROUTINE W REFLEX MICROSCOPIC
Bilirubin Urine: NEGATIVE
Glucose, UA: NEGATIVE mg/dL
Hgb urine dipstick: NEGATIVE
Ketones, ur: NEGATIVE mg/dL
Nitrite: NEGATIVE
Protein, ur: NEGATIVE mg/dL
Specific Gravity, Urine: 1.01 (ref 1.005–1.030)
Urobilinogen, UA: 0.2 mg/dL (ref 0.0–1.0)
pH: 6 (ref 5.0–8.0)

## 2013-11-29 LAB — CBC
HCT: 41.3 % (ref 36.0–46.0)
Hemoglobin: 14.1 g/dL (ref 12.0–15.0)
MCH: 27.8 pg (ref 26.0–34.0)
MCHC: 34.1 g/dL (ref 30.0–36.0)
MCV: 81.5 fL (ref 78.0–100.0)
Platelets: 204 10*3/uL (ref 150–400)
RBC: 5.07 MIL/uL (ref 3.87–5.11)
RDW: 12.6 % (ref 11.5–15.5)
WBC: 3.4 10*3/uL — ABNORMAL LOW (ref 4.0–10.5)

## 2013-11-29 LAB — COMPREHENSIVE METABOLIC PANEL
ALT: 12 U/L (ref 0–35)
AST: 19 U/L (ref 0–37)
Albumin: 4 g/dL (ref 3.5–5.2)
Alkaline Phosphatase: 62 U/L (ref 39–117)
BUN: 14 mg/dL (ref 6–23)
CO2: 28 mEq/L (ref 19–32)
Calcium: 9.4 mg/dL (ref 8.4–10.5)
Chloride: 97 mEq/L (ref 96–112)
Creatinine, Ser: 1.08 mg/dL (ref 0.50–1.10)
GFR calc Af Amer: 81 mL/min — ABNORMAL LOW (ref >90)
GFR calc non Af Amer: 70 mL/min — ABNORMAL LOW (ref >90)
Glucose, Bld: 86 mg/dL (ref 70–99)
Potassium: 4.7 mEq/L (ref 3.7–5.3)
Sodium: 136 mEq/L — ABNORMAL LOW (ref 137–147)
Total Bilirubin: 0.7 mg/dL (ref 0.3–1.2)
Total Protein: 7 g/dL (ref 6.0–8.3)

## 2013-11-29 LAB — URINE MICROSCOPIC-ADD ON

## 2013-11-29 MED ORDER — OXYCODONE-ACETAMINOPHEN 5-325 MG PO TABS
1.0000 | ORAL_TABLET | Freq: Once | ORAL | Status: AC
  Administered 2013-11-29: 1 via ORAL
  Filled 2013-11-29: qty 1

## 2013-11-29 MED ORDER — OXYCODONE-ACETAMINOPHEN 5-325 MG PO TABS: 2.0000 | ORAL_TABLET | ORAL | Status: DC | PRN

## 2013-11-29 MED ORDER — PROMETHAZINE HCL 25 MG PO TABS
25.0000 mg | ORAL_TABLET | Freq: Once | ORAL | Status: AC
  Administered 2013-11-29: 25 mg via ORAL
  Filled 2013-11-29: qty 1

## 2013-11-29 MED ORDER — PROMETHAZINE HCL 25 MG PO TABS: 25.0000 mg | ORAL_TABLET | Freq: Four times a day (QID) | ORAL | Status: DC | PRN

## 2013-11-29 MED ORDER — KETOROLAC TROMETHAMINE 60 MG/2ML IM SOLN
60.0000 mg | Freq: Once | INTRAMUSCULAR | Status: AC
  Administered 2013-11-29: 60 mg via INTRAMUSCULAR
  Filled 2013-11-29: qty 2

## 2013-11-29 MED ORDER — DIPHENHYDRAMINE HCL 50 MG/ML IJ SOLN: 25.0000 mg | Freq: Once | INTRAMUSCULAR | Status: DC

## 2013-11-29 NOTE — MAU Provider Note (Signed)
History     CSN: [id number]  Arrival date and time: 11/29/13 1357   None     Chief Complaint  Patient presents with  . Abdominal Pain  . Post-op Problem   HPI Comments: Brenda Blankenship 28 y.o. [id number] presents to MAU with ongoing pelvic pains. She has been seen and worked up x3 for same problem. She has negative CT of abdomen and pelvis. She ha been taking Tramadol but does not like the way it makes her feel. She is now taking motrin 600 mg. Her pain is "8" on 1-10 scale.  She had her menses last week and does not feel this is related to menses. She staes that Dr Tenny Crawoss punctured her uterus in surgery and discussed that with her mother and husband. No problems with bowel or bladder.   Abdominal Pain      Past Medical History  Diagnosis Date  . Environmental allergies   . Panic attacks   . Preterm labor   . Anemia   . Abnormal cervical Papanicolaou smear     cryo  . Pregnancy induced hypertension     Past Surgical History  Procedure Laterality Date  . Cryotherapy    . Tubal ligation    . Laparoscopic tubal ligation Bilateral 11/10/2013    Procedure: bilateral LAPAROSCOPIC TUBAL LIGATION with filshie clips;  Surgeon: Freddrick MarchKendra H. Tenny Crawoss, MD;  Location: [facility];  Service: Gynecology;  Laterality: Bilateral;    Family History  Problem Relation Age of Onset  . Hypertension Mother   . Hypertension Father   . Hyperlipidemia Father   . Diabetes Maternal Grandmother   . Cancer Paternal Grandmother     started in lung  . Hypertension Paternal Grandmother     History  Substance Use Topics  . Smoking status: Current Every Day Smoker -- 0.25 packs/day for 5 years    Types: Cigarettes  . Smokeless tobacco: Never Used     Comment: stopped with preg  . Alcohol Use: No    Allergies:  Allergies  Allergen Reactions  . Hydrocodone Other (See Comments)    Fainting, lightheadness    Prescriptions prior to admission  Medication Sig Dispense Refill  . hydrochlorothiazide  (HYDRODIURIL) 25 MG tablet Take 25 mg by mouth daily.      Marland Kitchen. ibuprofen (ADVIL,MOTRIN) 600 MG tablet Take 600 mg by mouth every 6 (six) hours as needed for moderate pain.      . Pediatric Multiple Vit-C-FA (FLINSTONES GUMMIES OMEGA-3 DHA) CHEW Chew 2 tablets by mouth daily.      . traMADol (ULTRAM) 50 MG tablet Take 1 tablet (50 mg total) by mouth every 6 (six) hours as needed.  15 tablet  0    Review of Systems  Constitutional: Negative.   HENT: Negative.   Eyes: Negative.   Respiratory: Negative.   Cardiovascular: Negative.   Gastrointestinal: Positive for abdominal pain.  Genitourinary: Negative.   Musculoskeletal: Negative.   Skin: Negative.   Neurological: Negative.   Psychiatric/Behavioral: Negative.    Physical Exam   Blood pressure 129/90, pulse 104, temperature 98.8 F (37.1 C), resp. rate 18, weight 63.504 kg (140 lb), last menstrual period 11/03/2013, unknown if currently breastfeeding.  Physical Exam  Constitutional: She is oriented to person, place, and time. She appears well-developed and well-nourished. No distress.  HENT:  Head: Normocephalic and atraumatic.  Cardiovascular: Normal rate and normal heart sounds.   Respiratory: Effort normal and breath sounds normal.  GI: Soft. There is tenderness.  Genitourinary:  Genital:External negative Vaginal:small amount white discharge Cervix:closed Bimanual:uterus tender   Musculoskeletal: Normal range of motion.  Neurological: She is alert and oriented to person, place, and time.  Skin: Skin is warm and dry.  Psychiatric: She has a normal mood and affect. Her behavior is normal. Judgment and thought content normal.   Results for orders placed during the hospital encounter of 11/29/13 (from the past 24 hour(s))  URINALYSIS, ROUTINE W REFLEX MICROSCOPIC     Status: Abnormal   Collection Time    11/29/13  2:30 PM      Result Value Ref Range   Color, Urine YELLOW  YELLOW   APPearance CLEAR  CLEAR   Specific Gravity,  Urine 1.010  1.005 - 1.030   pH 6.0  5.0 - 8.0   Glucose, UA NEGATIVE  NEGATIVE mg/dL   Hgb urine dipstick NEGATIVE  NEGATIVE   Bilirubin Urine NEGATIVE  NEGATIVE   Ketones, ur NEGATIVE  NEGATIVE mg/dL   Protein, ur NEGATIVE  NEGATIVE mg/dL   Urobilinogen, UA 0.2  0.0 - 1.0 mg/dL   Nitrite NEGATIVE  NEGATIVE   Leukocytes, UA SMALL (*) NEGATIVE  URINE MICROSCOPIC-ADD ON     Status: Abnormal   Collection Time    11/29/13  2:30 PM      Result Value Ref Range   Squamous Epithelial / LPF MANY (*) RARE   WBC, UA 3-6  <3 WBC/hpf   RBC / HPF 0-2  <3 RBC/hpf   Bacteria, UA FEW (*) RARE  CBC     Status: Abnormal   Collection Time    11/29/13  5:48 PM      Result Value Ref Range   WBC 3.4 (*) 4.0 - 10.5 K/uL   RBC 5.07  3.87 - 5.11 MIL/uL   Hemoglobin 14.1  12.0 - 15.0 g/dL   HCT 16.1  09.6 - 04.5 %   MCV 81.5  78.0 - 100.0 fL   MCH 27.8  26.0 - 34.0 pg   MCHC 34.1  30.0 - 36.0 g/dL   RDW 40.9  81.1 - 91.4 %   Platelets 204  150 - 400 K/uL  COMPREHENSIVE METABOLIC PANEL     Status: Abnormal   Collection Time    11/29/13  5:48 PM      Result Value Ref Range   Sodium 136 (*) 137 - 147 mEq/L   Potassium 4.7  3.7 - 5.3 mEq/L   Chloride 97  96 - 112 mEq/L   CO2 28  19 - 32 mEq/L   Glucose, Bld 86  70 - 99 mg/dL   BUN 14  6 - 23 mg/dL   Creatinine, Ser 7.82  0.50 - 1.10 mg/dL   Calcium 9.4  8.4 - 95.6 mg/dL   Total Protein 7.0  6.0 - 8.3 g/dL   Albumin 4.0  3.5 - 5.2 g/dL   AST 19  0 - 37 U/L   ALT 12  0 - 35 U/L   Alkaline Phosphatase 62  39 - 117 U/L   Total Bilirubin 0.7  0.3 - 1.2 mg/dL   GFR calc non Af Amer 70 (*) >90 mL/min   GFR calc Af Amer 81 (*) >90 mL/min   US Transvaginal Non-ob  11/29/2013   CLINICAL DATA:  uterine perforation uterine perforation 4/17. Abdominal swelling.  EXAM: TRANSABDOMINAL ULTRASOUND OF PELVIS  TECHNIQUE: Transabdominal ultrasound examination of the pelvis was performed including evaluation of the uterus, ovaries, adnexal regions, and pelvic  cul-de-sac.  COMPARISON:  None.  FINDINGS: Uterus  Measurements: 79 mm x 43 mm x 55 mm.  Normal myometrial echotexture.  Endometrium  Thickness: 9 mm, normal.  No focal abnormality visualized.  Right ovary  Measurements: 30 mm x 18 mm x 19 mm. Normal appearance/no adnexal mass.  Left ovary  Measurements: 24 mm x 25 mm x 26 mm. Intra ovarian cyst measuring 18 mm x 16 mm x 19 mm is present with typical appearing septations for hemorrhagic ovarian cyst.  Other findings: Prominent fallopian tube noted on the left, probably secondary to a recent ligation. Patient tender on the left.  IMPRESSION: 1. No acute abnormality.  Normal appearance of the uterus. 2. Mild dilation of the left fallopian tube with tenderness in this region. This is probably postoperative with recent tubal ligation. 3. Normal appearance of the endometrium.   Electronically Signed   By: Andreas NewportGeoffrey  Lamke M.D.   On: 11/29/2013 19:05   [place] Pelvis Complete  11/29/2013   CLINICAL DATA:  uterine perforation uterine perforation 4/17. Abdominal swelling.  EXAM: TRANSABDOMINAL ULTRASOUND OF PELVIS  TECHNIQUE: Transabdominal ultrasound examination of the pelvis was performed including evaluation of the uterus, ovaries, adnexal regions, and pelvic cul-de-sac.  COMPARISON:  None.  FINDINGS: Uterus  Measurements: 79 mm x 43 mm x 55 mm.  Normal myometrial echotexture.  Endometrium  Thickness: 9 mm, normal.  No focal abnormality visualized.  Right ovary  Measurements: 30 mm x 18 mm x 19 mm. Normal appearance/no adnexal mass.  Left ovary  Measurements: 24 mm x 25 mm x 26 mm. Intra ovarian cyst measuring 18 mm x 16 mm x 19 mm is present with typical appearing septations for hemorrhagic ovarian cyst.  Other findings: Prominent fallopian tube noted on the left, probably secondary to a recent ligation. Patient tender on the left.  IMPRESSION: 1. No acute abnormality.  Normal appearance of the uterus. 2. Mild dilation of the left fallopian tube with tenderness in this  region. This is probably postoperative with recent tubal ligation. 3. Normal appearance of the endometrium.   Electronically Signed   By: Andreas NewportGeoffrey  Lamke M.D.   On: 11/29/2013 19:05     MAU Course  Procedures  MDM  Toradol 60 mg IM now/ was not helpful Discussed case with Dr Claiborne Billingsallahan who advised repeat U/S, CBC, CMET Percocet/ Phenergan Called Dr Claiborne Billingsallahan with lab/ ultrasound results and she advised pt to follow up with Dr Tenny Crawoss and this may just be part of healing process  Assessment and Plan   A: Abdominal pains  P: Above orders Reassurance Percocet/ phenergan limited amounts Follow up with Dr Tenny Crawoss or return to MAU as needed  Brenda Blankenship 11/29/2013, 4:36 PM

## 2013-11-29 NOTE — MAU Note (Addendum)
Post tubal April 17, del 1/16, continues with abd swelling, cramping, passed 3 clots yesterday 6 and today, describes @ approx 3 cm with demonstration, has had radiology studies since tubal, syncope and seen at ED, concerned with bloating, swelling, no appetite, wt loss   Hypertension meds started 3 wks ago

## 2013-11-29 NOTE — Discharge Instructions (Signed)
Abdominal Pain, Adult °Many things can cause abdominal pain. Usually, abdominal pain is not caused by a disease and will improve without treatment. It can often be observed and treated at home. Your health care provider will do a physical exam and possibly order blood tests and X-rays to help determine the seriousness of your pain. However, in many cases, more time must pass before a clear cause of the pain can be found. Before that point, your health care provider may not know if you need more testing or further treatment. °HOME CARE INSTRUCTIONS  °Monitor your abdominal pain for any changes. The following actions may help to alleviate any discomfort you are experiencing: °· Only take over-the-counter or prescription medicines as directed by your health care provider. °· Do not take laxatives unless directed to do so by your health care provider. °· Try a clear liquid diet (broth, tea, or water) as directed by your health care provider. Slowly move to a bland diet as tolerated. °SEEK MEDICAL CARE IF: °· You have unexplained abdominal pain. °· You have abdominal pain associated with nausea or diarrhea. °· You have pain when you urinate or have a bowel movement. °· You experience abdominal pain that wakes you in the night. °· You have abdominal pain that is worsened or improved by eating food. °· You have abdominal pain that is worsened with eating fatty foods. °SEEK IMMEDIATE MEDICAL CARE IF:  °· Your pain does not go away within 2 hours. °· You have a fever. °· You keep throwing up (vomiting). °· Your pain is felt only in portions of the abdomen, such as the right side or the left lower portion of the abdomen. °· You pass bloody or black tarry stools. °MAKE SURE YOU: °· Understand these instructions.   °· Will watch your condition.   °· Will get help right away if you are not doing well or get worse.   °Document Released: 04/22/2005 Document Revised: 05/03/2013 Document Reviewed: 03/22/2013 °Exit[employer]® Patient  Information ©2014 [employer]. ° °

## 2013-12-07 ENCOUNTER — Encounter (HOSPITAL_COMMUNITY): Payer: Self-pay | Admitting: Emergency Medicine

## 2013-12-07 ENCOUNTER — Inpatient Hospital Stay (HOSPITAL_COMMUNITY): Payer: BC Managed Care – PPO

## 2013-12-07 ENCOUNTER — Encounter (HOSPITAL_COMMUNITY): Payer: Self-pay | Admitting: General Practice

## 2013-12-07 ENCOUNTER — Inpatient Hospital Stay (HOSPITAL_COMMUNITY)
Admission: AD | Admit: 2013-12-07 | Discharge: 2013-12-07 | Disposition: A | Discharge status: Home / Self Care | Payer: BC Managed Care – PPO | Source: Ambulatory Visit | Attending: Obstetrics and Gynecology | Admitting: Obstetrics and Gynecology

## 2013-12-07 ENCOUNTER — Emergency Department (HOSPITAL_COMMUNITY)
Admission: EM | Admit: 2013-12-07 | Discharge: 2013-12-07 | Discharge status: Against Medical Advice | Payer: BC Managed Care – PPO | Source: Home / Self Care

## 2013-12-07 DIAGNOSIS — N920 Excessive and frequent menstruation with regular cycle: Secondary | ICD-10-CM | POA: Insufficient documentation

## 2013-12-07 DIAGNOSIS — N979 Female infertility, unspecified: Secondary | ICD-10-CM | POA: Insufficient documentation

## 2013-12-07 DIAGNOSIS — N289 Disorder of kidney and ureter, unspecified: Secondary | ICD-10-CM | POA: Insufficient documentation

## 2013-12-07 DIAGNOSIS — F17200 Nicotine dependence, unspecified, uncomplicated: Secondary | ICD-10-CM

## 2013-12-07 DIAGNOSIS — Y838 Other surgical procedures as the cause of abnormal reaction of the patient, or of later complication, without mention of misadventure at the time of the procedure: Secondary | ICD-10-CM

## 2013-12-07 DIAGNOSIS — R55 Syncope and collapse: Secondary | ICD-10-CM | POA: Insufficient documentation

## 2013-12-07 DIAGNOSIS — R109 Unspecified abdominal pain: Secondary | ICD-10-CM | POA: Insufficient documentation

## 2013-12-07 DIAGNOSIS — IMO0002 Reserved for concepts with insufficient information to code with codable children: Secondary | ICD-10-CM

## 2013-12-07 DIAGNOSIS — N926 Irregular menstruation, unspecified: Secondary | ICD-10-CM | POA: Insufficient documentation

## 2013-12-07 DIAGNOSIS — R42 Dizziness and giddiness: Secondary | ICD-10-CM

## 2013-12-07 DIAGNOSIS — G8918 Other acute postprocedural pain: Secondary | ICD-10-CM | POA: Insufficient documentation

## 2013-12-07 DIAGNOSIS — N949 Unspecified condition associated with female genital organs and menstrual cycle: Secondary | ICD-10-CM | POA: Insufficient documentation

## 2013-12-07 LAB — URINALYSIS, ROUTINE W REFLEX MICROSCOPIC
Bilirubin Urine: NEGATIVE
Glucose, UA: NEGATIVE mg/dL
Ketones, ur: NEGATIVE mg/dL
Leukocytes, UA: NEGATIVE
Nitrite: NEGATIVE
Protein, ur: NEGATIVE mg/dL
Specific Gravity, Urine: 1.02 (ref 1.005–1.030)
Urobilinogen, UA: 2 mg/dL — ABNORMAL HIGH (ref 0.0–1.0)
pH: 7 (ref 5.0–8.0)

## 2013-12-07 LAB — CBC WITH DIFFERENTIAL/PLATELET
Basophils Absolute: 0 10*3/uL (ref 0.0–0.1)
Basophils Relative: 1 % (ref 0–1)
Eosinophils Absolute: 0.1 10*3/uL (ref 0.0–0.7)
Eosinophils Relative: 3 % (ref 0–5)
HCT: 37.8 % (ref 36.0–46.0)
Hemoglobin: 12.8 g/dL (ref 12.0–15.0)
Lymphocytes Relative: 59 % — ABNORMAL HIGH (ref 12–46)
Lymphs Abs: 2.1 10*3/uL (ref 0.7–4.0)
MCH: 27.4 pg (ref 26.0–34.0)
MCHC: 33.9 g/dL (ref 30.0–36.0)
MCV: 80.8 fL (ref 78.0–100.0)
Monocytes Absolute: 0.3 10*3/uL (ref 0.1–1.0)
Monocytes Relative: 9 % (ref 3–12)
Neutro Abs: 1 10*3/uL — ABNORMAL LOW (ref 1.7–7.7)
Neutrophils Relative %: 28 % — ABNORMAL LOW (ref 43–77)
Platelets: 196 10*3/uL (ref 150–400)
RBC: 4.68 MIL/uL (ref 3.87–5.11)
RDW: 12.4 % (ref 11.5–15.5)
WBC: 3.6 10*3/uL — ABNORMAL LOW (ref 4.0–10.5)

## 2013-12-07 LAB — URINE MICROSCOPIC-ADD ON

## 2013-12-07 LAB — COMPREHENSIVE METABOLIC PANEL
ALT: 13 U/L (ref 0–35)
AST: 18 U/L (ref 0–37)
Albumin: 4 g/dL (ref 3.5–5.2)
Alkaline Phosphatase: 69 U/L (ref 39–117)
BUN: 12 mg/dL (ref 6–23)
CO2: 29 mEq/L (ref 19–32)
Calcium: 9.2 mg/dL (ref 8.4–10.5)
Chloride: 100 mEq/L (ref 96–112)
Creatinine, Ser: 1.3 mg/dL — ABNORMAL HIGH (ref 0.50–1.10)
GFR calc Af Amer: 65 mL/min — ABNORMAL LOW (ref >90)
GFR calc non Af Amer: 56 mL/min — ABNORMAL LOW (ref >90)
Glucose, Bld: 91 mg/dL (ref 70–99)
Potassium: 4.4 mEq/L (ref 3.7–5.3)
Sodium: 138 mEq/L (ref 137–147)
Total Bilirubin: 0.3 mg/dL (ref 0.3–1.2)
Total Protein: 7.3 g/dL (ref 6.0–8.3)

## 2013-12-07 MED ORDER — HYDROMORPHONE HCL PF 1 MG/ML IJ SOLN
1.0000 mg | Freq: Once | INTRAMUSCULAR | Status: AC
  Administered 2013-12-07: 1 mg via INTRAMUSCULAR
  Filled 2013-12-07: qty 1

## 2013-12-07 NOTE — Discharge Instructions (Signed)

## 2013-12-07 NOTE — ED Notes (Addendum)
Had tubal Ligation surgery4/17 and still swollen and still bleeding on and off but has stayed since Sunday it is heavy and has been cramping( had some complications from surgery from having punctured uterus) pt dizzy , pt states she has had ct scans etc but still is having issues [facility]

## 2013-12-07 NOTE — MAU Note (Signed)
Had tubal on 04/17, when got out was on cycle - bled for 6 days, heavy , lots of clots.  Has continued to have cramping- it is unchanged.  abd is still swollen.  Period started on 05/10, has gone through 2 boxes of pads since then.  Today at work she blacked out.  [facility] called dr. Rito Ehrlichecided to just come here

## 2013-12-07 NOTE — MAU Provider Note (Signed)
History     CSN: [id number]  Arrival date and time: 12/07/13 1613   First Provider Initiated Contact with Patient 12/07/13 1650      Chief Complaint  Patient presents with  . Abdominal Pain  . Vaginal Bleeding   HPI   Brenda Blankenship is a 28 y.o. G2P2002 who presents today because she "passed out" at work. States she ate lunch at 1200, then at 1330 everything was fuzzy and she felt "weird". She states that she was looking at computer screen and it was blurry. She stood up, and then fell down. She states that she did not lose consciousness.    She states that she has always had irregular periods. She states that in the last month she has bled three time.  11/07/13-11/09/13 Surgery (BTL) 11/10/13 11/10/13-11/21/13 12/03/13-currently.  This current bleeding episode has been very heavy. She states that she is passing large clots. She states that they are "bigger than a nickel", and when she passes the clot she has a lot of cramping. She states that the blood is "very dark/brown red".   She denies any nausea/vomiting/diarrhea/constipation. She states that her last BM was yesterday. She denies any fever at home.   She had a CT scan on 11/13/13, and an ultrasound on 11/29/13. Both of these were normal, with some possible post-op changes. She states that her next appointment in the office is in June.    Past Medical History  Diagnosis Date  . Environmental allergies   . Panic attacks   . Preterm labor   . Anemia   . Abnormal cervical Papanicolaou smear     cryo  . Pregnancy induced hypertension     Past Surgical History  Procedure Laterality Date  . Cryotherapy    . Tubal ligation    . Laparoscopic tubal ligation Bilateral 11/10/2013    Procedure: bilateral LAPAROSCOPIC TUBAL LIGATION with filshie clips;  Surgeon: Freddrick MarchKendra H. Tenny Crawoss, MD;  Location: [facility];  Service: Gynecology;  Laterality: Bilateral;    Family History  Problem Relation Age of Onset  . Hypertension Mother   .  Hypertension Father   . Hyperlipidemia Father   . Diabetes Maternal Grandmother   . Cancer Paternal Grandmother     started in lung  . Hypertension Paternal Grandmother     History  Substance Use Topics  . Smoking status: Current Every Day Smoker -- 0.25 packs/day for 5 years    Types: Cigarettes  . Smokeless tobacco: Never Used     Comment: stopped with preg  . Alcohol Use: No    Allergies:  Allergies  Allergen Reactions  . Hydrocodone Other (See Comments)    Fainting, lightheadness    Prescriptions prior to admission  Medication Sig Dispense Refill  . hydrochlorothiazide (HYDRODIURIL) 25 MG tablet Take 25 mg by mouth daily.      Marland Kitchen. ibuprofen (ADVIL,MOTRIN) 600 MG tablet Take 600 mg by mouth every 6 (six) hours as needed for moderate pain.      Marland Kitchen. oxyCODONE-acetaminophen (PERCOCET/ROXICET) 5-325 MG per tablet Take 2 tablets by mouth every 4 (four) hours as needed for severe pain.  6 tablet  0  . Pediatric Multiple Vit-C-FA (FLINSTONES GUMMIES OMEGA-3 DHA) CHEW Chew 2 tablets by mouth daily.      . traMADol (ULTRAM) 50 MG tablet Take 50 mg by mouth every 6 (six) hours as needed.        ROS Physical Exam   Last menstrual period 12/03/2013, unknown if currently breastfeeding.  Physical Exam  Nursing note and vitals reviewed. Constitutional: She is oriented to person, place, and time. She appears well-developed and well-nourished. No distress.  Cardiovascular: Normal rate.   Respiratory: Effort normal.  GI: Soft. There is no tenderness. There is no rebound.  Genitourinary:   External: no lesion Vagina: small amount of blood seen. One faux swab used to clean out the vagina.  Cervix: pink, smooth, +CMT  Uterus: NSSC Adnexa: some tenderness on the left.    Neurological: She is alert and oriented to person, place, and time.  Skin: Skin is warm and dry.  Psychiatric: She has a normal mood and affect.    MAU Course  Procedures  Results for Brenda Blankenship, Brenda Blankenship (MRN  [id number]) as of 12/07/2013 18:01  Ref. Range 12/07/2013 15:08  Sodium Latest Range: 137-147 mEq/L 138  Potassium Latest Range: 3.7-5.3 mEq/L 4.4  Chloride Latest Range: 96-112 mEq/L 100  CO2 Latest Range: 19-32 mEq/L 29  BUN Latest Range: 6-23 mg/dL 12  Creatinine Latest Range: 0.50-1.10 mg/dL 4.091.30 (H)  Calcium Latest Range: 8.4-10.5 mg/dL 9.2  GFR calc non Af Amer Latest Range: >90 mL/min 56 (L)  GFR calc Af Amer Latest Range: >90 mL/min 65 (L)  Glucose Latest Range: 70-99 mg/dL 91  Alkaline Phosphatase Latest Range: 39-117 U/L 69  Albumin Latest Range: 3.5-5.2 g/dL 4.0  AST Latest Range: 0-37 U/L 18  ALT Latest Range: 0-35 U/L 13  Total Protein Latest Range: 6.0-8.3 g/dL 7.3  Total Bilirubin Latest Range: 0.3-1.2 mg/dL 0.3  WBC Latest Range: 4.0-10.5 K/uL 3.6 (L)  RBC Latest Range: 3.87-5.11 MIL/uL 4.68  Hemoglobin Latest Range: 12.0-15.0 g/dL 81.112.8  HCT Latest Range: 36.0-46.0 % 37.8  MCV Latest Range: 78.0-100.0 fL 80.8  MCH Latest Range: 26.0-34.0 pg 27.4  MCHC Latest Range: 30.0-36.0 g/dL 91.433.9  RDW Latest Range: 11.5-15.5 % 12.4  Platelets Latest Range: 150-400 K/uL 196  Neutrophils Relative % Latest Range: 43-77 % 28 (L)  Lymphocytes Relative Latest Range: 12-46 % 59 (H)  Monocytes Relative Latest Range: 3-12 % 9  Eosinophils Relative Latest Range: 0-5 % 3  Basophils Relative Latest Range: 0-1 % 1  NEUT# Latest Range: 1.7-7.7 K/uL 1.0 (L)  Lymphocytes Absolute Latest Range: 0.7-4.0 K/uL 2.1  Monocytes Absolute Latest Range: 0.1-1.0 K/uL 0.3  Eosinophils Absolute Latest Range: 0.0-0.7 K/uL 0.1  Basophils Absolute Latest Range: 0.0-0.1 K/uL 0.0   Results for Brenda Blankenship, Brenda Blankenship (MRN [id number]) as of 12/07/2013 18:01  Ref. Range 12/07/2013 17:35  Color, Urine Latest Range: YELLOW  YELLOW  APPearance Latest Range: CLEAR  CLEAR  Specific Gravity, Urine Latest Range: 1.005-1.030  1.020  pH Latest Range: 5.0-8.0  7.0  Glucose Latest Range: NEGATIVE mg/dL NEGATIVE  Bilirubin Urine  Latest Range: NEGATIVE  NEGATIVE  Ketones, ur Latest Range: NEGATIVE mg/dL NEGATIVE  Protein Latest Range: NEGATIVE mg/dL NEGATIVE  Urobilinogen, UA Latest Range: 0.0-1.0 mg/dL 2.0 (H)  Nitrite Latest Range: NEGATIVE  NEGATIVE  Leukocytes, UA Latest Range: NEGATIVE  NEGATIVE  Hgb urine dipstick Latest Range: NEGATIVE  LARGE (A)  WBC, UA Latest Range: <3 WBC/hpf 0-2  RBC / HPF Latest Range: <3 RBC/hpf 0-2  Squamous Epithelial / LPF Latest Range: RARE  FEW (A)   Ct Abdomen Pelvis Wo Contrast  12/07/2013   CLINICAL DATA:  Continued pelvic pain and renal insufficiency following tubal ligation 11/10/2013. Question ureteral injury.  EXAM: CT ABDOMEN AND PELVIS WITHOUT CONTRAST  TECHNIQUE: Multidetector CT imaging of the abdomen and pelvis was performed following the standard protocol  without IV contrast.  COMPARISON:  US PELVIS COMPLETE dated 11/29/2013; CT ABD/PELVIS W CM dated 11/13/2013  FINDINGS: The lung bases are clear.  There is no pleural effusion.  Both kidneys remain unremarkable in appearance. There is no evidence of urinary tract calculus, hydronephrosis or perinephric soft tissue stranding. Bilateral tubal ligation clips are stable in appearance. The previously demonstrated free pelvic fluid has resolved. There is no adnexal mass or bladder abnormality. Right-sided pelvic phleboliths are stable.  The liver, spleen, gallbladder, pancreas and adrenal glands appear normal.  There is no evidence of bowel obstruction, wall thickening, inflammatory process or lymphadenopathy. The appendix appears normal.  There are no acute or worrisome osseous findings. Right-sided lumbosacral assimilation joint noted.  IMPRESSION: 1. Interval resolution of previously demonstrated free pelvic fluid and pneumoperitoneum related to tubal ligation. 2. No acute findings. No evidence of retroperitoneal hematoma or hydronephrosis.   Electronically Signed   By: Roxy Horseman M.D.   On: 12/07/2013 19:49     1728: D/W Dr. Tenny Craw,  reviewed labs. Will send UA at this time, and give pain medication. Will call back with UA results.  Assessment and Plan   1. Post-op pain   2. Heavy menses    Elevated creatinine today FU in the office in 1 week to recheck creatinine Return to MAU as needed  Follow-up Information   Follow up with Almon Hercules., MD In 1 week.   Specialty:  Obstetrics and Gynecology   Contact information:   [address] [place] [address] [phone]        Tawnya Crook 12/07/2013, 6:01 PM

## 2014-01-08 ENCOUNTER — Encounter (HOSPITAL_COMMUNITY): Payer: Self-pay | Admitting: Emergency Medicine

## 2014-01-08 ENCOUNTER — Emergency Department (HOSPITAL_COMMUNITY)
Admission: EM | Admit: 2014-01-08 | Discharge: 2014-01-09 | Disposition: A | Discharge status: Home / Self Care | Payer: BC Managed Care – PPO | Attending: Emergency Medicine | Admitting: Emergency Medicine

## 2014-01-08 DIAGNOSIS — Z862 Personal history of diseases of the blood and blood-forming organs and certain disorders involving the immune mechanism: Secondary | ICD-10-CM | POA: Insufficient documentation

## 2014-01-08 DIAGNOSIS — Z791 Long term (current) use of non-steroidal anti-inflammatories (NSAID): Secondary | ICD-10-CM | POA: Insufficient documentation

## 2014-01-08 DIAGNOSIS — N939 Abnormal uterine and vaginal bleeding, unspecified: Secondary | ICD-10-CM | POA: Insufficient documentation

## 2014-01-08 DIAGNOSIS — Z8659 Personal history of other mental and behavioral disorders: Secondary | ICD-10-CM | POA: Insufficient documentation

## 2014-01-08 DIAGNOSIS — N926 Irregular menstruation, unspecified: Secondary | ICD-10-CM | POA: Insufficient documentation

## 2014-01-08 DIAGNOSIS — Z79899 Other long term (current) drug therapy: Secondary | ICD-10-CM | POA: Insufficient documentation

## 2014-01-08 DIAGNOSIS — N949 Unspecified condition associated with female genital organs and menstrual cycle: Secondary | ICD-10-CM | POA: Insufficient documentation

## 2014-01-08 DIAGNOSIS — Z3202 Encounter for pregnancy test, result negative: Secondary | ICD-10-CM | POA: Insufficient documentation

## 2014-01-08 DIAGNOSIS — R112 Nausea with vomiting, unspecified: Secondary | ICD-10-CM | POA: Insufficient documentation

## 2014-01-08 DIAGNOSIS — F17200 Nicotine dependence, unspecified, uncomplicated: Secondary | ICD-10-CM | POA: Insufficient documentation

## 2014-01-08 DIAGNOSIS — R102 Pelvic and perineal pain: Secondary | ICD-10-CM

## 2014-01-08 LAB — CBC WITH DIFFERENTIAL/PLATELET
Basophils Absolute: 0 10*3/uL (ref 0.0–0.1)
Basophils Relative: 0 % (ref 0–1)
Eosinophils Absolute: 0.3 10*3/uL (ref 0.0–0.7)
Eosinophils Relative: 7 % — ABNORMAL HIGH (ref 0–5)
HCT: 33.6 % — ABNORMAL LOW (ref 36.0–46.0)
Hemoglobin: 11.3 g/dL — ABNORMAL LOW (ref 12.0–15.0)
Lymphocytes Relative: 57 % — ABNORMAL HIGH (ref 12–46)
Lymphs Abs: 2.6 10*3/uL (ref 0.7–4.0)
MCH: 27 pg (ref 26.0–34.0)
MCHC: 33.6 g/dL (ref 30.0–36.0)
MCV: 80.2 fL (ref 78.0–100.0)
Monocytes Absolute: 0.4 10*3/uL (ref 0.1–1.0)
Monocytes Relative: 10 % (ref 3–12)
Neutro Abs: 1.2 10*3/uL — ABNORMAL LOW (ref 1.7–7.7)
Neutrophils Relative %: 26 % — ABNORMAL LOW (ref 43–77)
Platelets: 182 10*3/uL (ref 150–400)
RBC: 4.19 MIL/uL (ref 3.87–5.11)
RDW: 13.8 % (ref 11.5–15.5)
WBC: 4.5 10*3/uL (ref 4.0–10.5)

## 2014-01-08 MED ORDER — HYDROMORPHONE HCL PF 1 MG/ML IJ SOLN
0.5000 mg | Freq: Once | INTRAMUSCULAR | Status: AC
  Administered 2014-01-09: 0.5 mg via INTRAVENOUS
  Filled 2014-01-08: qty 1

## 2014-01-08 NOTE — ED Notes (Signed)
Pt arrived to the ED with left sided abdominal pain.  Pt states that she had a tubal ligation in April but had complications in surgery.  Pt states that since that time.  Pt states that she has had pedal edema for the ssame amount of time.  Pt states that the swelling increases during her cycle.

## 2014-01-09 LAB — COMPREHENSIVE METABOLIC PANEL
ALT: 9 U/L (ref 0–35)
AST: 14 U/L (ref 0–37)
Albumin: 3.5 g/dL (ref 3.5–5.2)
Alkaline Phosphatase: 49 U/L (ref 39–117)
BUN: 7 mg/dL (ref 6–23)
CO2: 25 mEq/L (ref 19–32)
Calcium: 8.5 mg/dL (ref 8.4–10.5)
Chloride: 104 mEq/L (ref 96–112)
Creatinine, Ser: 0.91 mg/dL (ref 0.50–1.10)
GFR calc Af Amer: >90 mL/min (ref >90)
GFR calc non Af Amer: 86 mL/min — ABNORMAL LOW (ref >90)
Glucose, Bld: 82 mg/dL (ref 70–99)
Potassium: 3.9 mEq/L (ref 3.7–5.3)
Sodium: 139 mEq/L (ref 137–147)
Total Bilirubin: 0.3 mg/dL (ref 0.3–1.2)
Total Protein: 6.3 g/dL (ref 6.0–8.3)

## 2014-01-09 LAB — URINALYSIS, ROUTINE W REFLEX MICROSCOPIC
Bilirubin Urine: NEGATIVE
Glucose, UA: NEGATIVE mg/dL
Ketones, ur: NEGATIVE mg/dL
Leukocytes, UA: NEGATIVE
Nitrite: NEGATIVE
Protein, ur: NEGATIVE mg/dL
Specific Gravity, Urine: 1.016 (ref 1.005–1.030)
Urobilinogen, UA: 1 mg/dL (ref 0.0–1.0)
pH: 6 (ref 5.0–8.0)

## 2014-01-09 LAB — GC/CHLAMYDIA PROBE AMP
CT Probe RNA: NEGATIVE
GC Probe RNA: NEGATIVE

## 2014-01-09 LAB — URINE MICROSCOPIC-ADD ON

## 2014-01-09 LAB — WET PREP, GENITAL
Clue Cells Wet Prep HPF POC: NONE SEEN
Trich, Wet Prep: NONE SEEN
Yeast Wet Prep HPF POC: NONE SEEN

## 2014-01-09 LAB — LIPASE, BLOOD: Lipase: 24 U/L (ref 11–59)

## 2014-01-09 LAB — PREGNANCY, URINE: Preg Test, Ur: NEGATIVE

## 2014-01-09 MED ORDER — HYDROMORPHONE HCL PF 1 MG/ML IJ SOLN
0.5000 mg | Freq: Once | INTRAMUSCULAR | Status: AC
  Administered 2014-01-09: 0.5 mg via INTRAVENOUS
  Filled 2014-01-09: qty 1

## 2014-01-09 MED ORDER — NAPROXEN 500 MG PO TABS: 500.0000 mg | ORAL_TABLET | Freq: Two times a day (BID) | ORAL | Status: DC

## 2014-01-09 NOTE — ED Provider Notes (Signed)
CSN: [id number]     Arrival date & time 01/08/14  1958 History   First MD Initiated Contact with Patient 01/08/14 2252     Chief Complaint  Patient presents with  . Abdominal Pain   HPI Comments: Patient presents to the ED with LLQ for several months.  Patient states that she has had this pain ever since she had a tubal ligation performed in April of 2015.  Patient states during her tubal ligation she had a left uterine perforation.  During this time she has had 8/10 LLQ pain without radiation.  She states that her pain is associated with dysparunia, nausea, vomiting, and dark tarry stools.  She denies any hematochezia, urinary symptoms, vaginal discharge, vaginal bleeding outside of her cycles, fever, chills, new sexual partners.  Patient admits to still passing gas at this time.  She is currently on her menstrual cycle and states that menstrual cycles increase her pain.  She has not other aggravating or relieving factors at this time.     Patient is a 28 y.o. female presenting with abdominal pain. The history is provided by the patient. No language interpreter was used.  Abdominal Pain Associated symptoms: nausea and vomiting   Associated symptoms: no chest pain, no chills, no constipation, no diarrhea, no dysuria, no fatigue, no fever, no hematuria, no shortness of breath, no vaginal bleeding and no vaginal discharge     Past Medical History  Diagnosis Date  . Environmental allergies   . Panic attacks   . Preterm labor   . Anemia   . Abnormal cervical Papanicolaou smear     cryo  . Pregnancy induced hypertension    Past Surgical History  Procedure Laterality Date  . Cryotherapy    . Tubal ligation    . Laparoscopic tubal ligation Bilateral 11/10/2013    Procedure: bilateral LAPAROSCOPIC TUBAL LIGATION with filshie clips;  Surgeon: Freddrick March. Tenny Craw, MD;  Location: [facility];  Service: Gynecology;  Laterality: Bilateral;   Family History  Problem Relation Age of Onset  . Hypertension  Mother   . Hypertension Father   . Hyperlipidemia Father   . Diabetes Maternal Grandmother   . Cancer Paternal Grandmother     started in lung  . Hypertension Paternal Grandmother    History  Substance Use Topics  . Smoking status: Current Every Day Smoker -- 0.25 packs/day for 5 years    Types: Cigarettes  . Smokeless tobacco: Never Used     Comment: stopped with preg  . Alcohol Use: No   OB History   Grav Para Term Preterm Abortions TAB SAB Ect Mult Living   2 2 2  0 0 0 0 0 0 2     Review of Systems  Constitutional: Negative for fever, chills and fatigue.  Respiratory: Negative for chest tightness, shortness of breath and wheezing.   Cardiovascular: Negative for chest pain.  Gastrointestinal: Positive for nausea, vomiting and abdominal pain. Negative for diarrhea, constipation and blood in stool.  Genitourinary: Positive for menstrual problem and pelvic pain. Negative for dysuria, urgency, frequency, hematuria, flank pain, decreased urine volume, vaginal bleeding, vaginal discharge, difficulty urinating and vaginal pain.  Musculoskeletal: Negative for back pain.  Skin: Negative for rash.  All other systems reviewed and are negative.     Allergies  Hydrocodone  Home Medications   Prior to Admission medications   Medication Sig Start Date End Date Taking? Authorizing Provider  hydrochlorothiazide (HYDRODIURIL) 25 MG tablet Take 25 mg by mouth daily.  Yes Historical Provider, MD  ibuprofen (ADVIL,MOTRIN) 600 MG tablet Take 600 mg by mouth every 6 (six) hours as needed for moderate pain.   Yes Historical Provider, MD  oxyCODONE-acetaminophen (PERCOCET/ROXICET) 5-325 MG per tablet Take 2 tablets by mouth every 4 (four) hours as needed for severe pain. 11/29/13  Yes Delbert PhenixLinda M Barefoot, NP  Pediatric Multiple Vit-C-FA (FLINSTONES GUMMIES OMEGA-3 DHA) CHEW Chew 2 tablets by mouth daily.   Yes Historical Provider, MD  naproxen (NAPROSYN) 500 MG tablet Take 1 tablet (500 mg total)  by mouth 2 (two) times daily. 01/09/14   Courtney A Forucci, PA-C   BP 152/97  Pulse 116  Temp(Src) 98.5 F (36.9 C) (Oral)  Resp 16  Ht 5\' 3"  (1.6 m)  SpO2 100%  LMP 01/08/2014 Physical Exam  Nursing note and vitals reviewed. Constitutional: She is oriented to person, place, and time. She appears well-developed and well-nourished. No distress.  HENT:  Head: Normocephalic and atraumatic.  Mouth/Throat: No oropharyngeal exudate.  Eyes: Conjunctivae and EOM are normal. Pupils are equal, round, and reactive to light. No scleral icterus.  Neck: Normal range of motion. Neck supple. No JVD present. No thyromegaly present.  Cardiovascular: Normal rate, regular rhythm, normal heart sounds and intact distal pulses.  Exam reveals no gallop and no friction rub.   No murmur heard. Pulmonary/Chest: Effort normal and breath sounds normal. No respiratory distress. She has no wheezes. She has no rales. She exhibits no tenderness.  Abdominal: Soft. Normal appearance and bowel sounds are normal. She exhibits no mass. There is tenderness in the left lower quadrant. There is no rigidity, no rebound, no guarding, no CVA tenderness, no tenderness at McBurney's point and negative Murphy's sign.  Genitourinary: Rectum normal and vagina normal. Rectal exam shows no external hemorrhoid, no internal hemorrhoid, no fissure, no mass, no tenderness and anal tone normal. Guaiac negative stool. Pelvic exam was performed with patient supine. No labial fusion. There is no rash, tenderness, lesion or injury on the right labia. There is no rash, tenderness, lesion or injury on the left labia. Uterus is tender. Cervix exhibits motion tenderness and discharge. Cervix exhibits no friability. Right adnexum displays no mass, no tenderness and no fullness. Left adnexum displays no mass, no tenderness and no fullness. No erythema, tenderness or bleeding around the vagina. No foreign body around the vagina. No signs of injury around the  vagina. No vaginal discharge found.  Multiparous os with scant bloody discharge.  Mild CMT tenderness.  Musculoskeletal: Normal range of motion.  Lymphadenopathy:    She has no cervical adenopathy.  Neurological: She is alert and oriented to person, place, and time.  Skin: Skin is warm and dry. She is not diaphoretic.  Psychiatric: She has a normal mood and affect. Her behavior is normal. Judgment and thought content normal.    ED Course  Procedures (including critical care time) Labs Review Labs Reviewed  WET PREP, GENITAL - Abnormal; Notable for the following:    WBC, Wet Prep HPF POC FEW (*)    All other components within normal limits  CBC WITH DIFFERENTIAL - Abnormal; Notable for the following:    Hemoglobin 11.3 (*)    HCT 33.6 (*)    Neutrophils Relative % 26 (*)    Neutro Abs 1.2 (*)    Lymphocytes Relative 57 (*)    Eosinophils Relative 7 (*)    All other components within normal limits  COMPREHENSIVE METABOLIC PANEL - Abnormal; Notable for the following:  GFR calc non Af Amer 86 (*)    All other components within normal limits  URINALYSIS, ROUTINE W REFLEX MICROSCOPIC - Abnormal; Notable for the following:    APPearance CLOUDY (*)    Hgb urine dipstick LARGE (*)    All other components within normal limits  URINE MICROSCOPIC-ADD ON - Abnormal; Notable for the following:    Squamous Epithelial / LPF MANY (*)    Bacteria, UA FEW (*)    All other components within normal limits  GC/CHLAMYDIA PROBE AMP  LIPASE, BLOOD  PREGNANCY, URINE  POC OCCULT BLOOD, ED    Imaging Review No results found.   EKG Interpretation None      MDM   Final diagnoses:  Pelvic pain   Patient [facility] with several month history of LLQ pain and swelling.  Patient was given full workup here in the ED.  CBC, CMP, Lipase, Urine Pregnancy, and wet prep were unremarkable here.  Urine shows hemoglobin and few bacteria but is likely contaminated as the patient is on her  menstrual cycle here.  Nitrites and leukocytes were negative.  GC probe is pending but do not suspect PID at this time. Patient has had a recent CT scan of the abdomen and pelvis in 11/2012 which showed no acute abnormalities.  Patient has also had several pelvic ultrasounds which show no acute abnormalities.  I see no emergent conditions here in the ED such as ectopic pregnancy or a surgical abdomen.  This is likely chronic pelvic pain.  Have advised the patient to follow-up with OBGyn and have given her resource information for follow-up with a second opinion.  Dr. Nicanor AlconPalumbo has been notified of the patient and has also reviewed her labs.  She is comfortable with the plan at this time.  I will send her home with Naproxen 500 mg BID to be taken with food.  Patient understands and is amenable to the plan at this time.  Return precautions of intractable abdominal pain and development of purulent vaginal discharge have been given.      Clydie Braunourtney A Forucci, PA-C 01/09/14 0210

## 2014-01-09 NOTE — ED Provider Notes (Signed)
Medical screening examination/treatment/procedure(s) were performed by non-physician practitioner and as supervising physician I was immediately available for consultation/collaboration.   EKG Interpretation None        Amonie Wisser K Alyla Pietila-Rasch, MD 01/09/14 [phone]

## 2014-01-09 NOTE — Discharge Instructions (Signed)
Pelvic Pain, Female °Female pelvic pain can be caused by many different things and start from a variety of places. Pelvic pain refers to pain that is located in the lower half of the abdomen and between your hips. The pain may occur over a short period of time (acute) or may be reoccurring (chronic). The cause of pelvic pain may be related to disorders affecting the female reproductive organs (gynecologic), but it may also be related to the bladder, kidney stones, an intestinal complication, or muscle or skeletal problems. Getting help right away for pelvic pain is important, especially if there has been severe, sharp, or a sudden onset of unusual pain. It is also important to get help right away because some types of pelvic pain can be life threatening.  °CAUSES  °Below are only some of the causes of pelvic pain. The causes of pelvic pain can be in one of several categories.  °· Gynecologic. °· Pelvic inflammatory disease. °· Sexually transmitted infection. °· Ovarian cyst or a twisted ovarian ligament (ovarian torsion). °· Uterine lining that grows outside the uterus (endometriosis). °· Fibroids, cysts, or tumors. °· Ovulation. °· Pregnancy. °· Pregnancy that occurs outside the uterus (ectopic pregnancy). °· Miscarriage. °· Labor. °· Abruption of the placenta or ruptured uterus. °· Infection. °· Uterine infection (endometritis). °· Bladder infection. °· Diverticulitis. °· Miscarriage related to a uterine infection (septic abortion). °· Bladder. °· Inflammation of the bladder (cystitis). °· Kidney stone(s). °· Gastrointenstinal. °· Constipation. °· Diverticulitis. °· Neurologic. °· Trauma. °· Feeling pelvic pain because of mental or emotional causes (psychosomatic). °· Cancers of the bowel or pelvis. °EVALUATION  °Your caregiver will want to take a careful history of your concerns. This includes recent changes in your health, a careful gynecologic history of your periods (menses), and a sexual history. Obtaining  your family history and medical history is also important. Your caregiver may suggest a pelvic exam. A pelvic exam will help identify the location and severity of the pain. It also helps in the evaluation of which organ system may be involved. In order to identify the cause of the pelvic pain and be properly treated, your caregiver may order tests. These tests may include:  °· A pregnancy test. °· Pelvic ultrasonography. °· An X-ray exam of the abdomen. °· A urinalysis or evaluation of vaginal discharge. °· Blood tests. °HOME CARE INSTRUCTIONS  °· Only take over-the-counter or prescription medicines for pain, discomfort, or fever as directed by your caregiver.   °· Rest as directed by your caregiver.   °· Eat a balanced diet.   °· Drink enough fluids to make your urine clear or pale yellow, or as directed.   °· Avoid sexual intercourse if it causes pain.   °· Apply warm or cold compresses to the lower abdomen depending on which one helps the pain.   °· Avoid stressful situations.   °· Keep a journal of your pelvic pain. Write down when it started, where the pain is located, and if there are things that seem to be associated with the pain, such as food or your menstrual cycle. °· Follow up with your caregiver as directed.   °SEEK MEDICAL CARE IF: °· Your medicine does not help your pain. °· You have abnormal vaginal discharge. °SEEK IMMEDIATE MEDICAL CARE IF:  °· You have heavy bleeding from the vagina.   °· Your pelvic pain increases.   °· You feel lightheaded or faint.   °· You have chills.   °· You have pain with urination or blood in your urine.   °· You have uncontrolled   diarrhea or vomiting.   °· You have a fever or persistent symptoms for more than 3 days. °· You have a fever and your symptoms suddenly get worse.   °· You are being physically or sexually abused.   °MAKE SURE YOU: °· Understand these instructions. °· Will watch your condition. °· Will get help if you are not doing well or get worse. °Document  Released: 06/09/2004 Document Revised: 01/12/2012 Document Reviewed: 11/02/2011 °[employer]® Patient Information ©2014 [employer]. ° °

## 2014-01-15 ENCOUNTER — Encounter (HOSPITAL_COMMUNITY): Payer: Self-pay | Admitting: Emergency Medicine

## 2014-01-15 ENCOUNTER — Emergency Department (INDEPENDENT_AMBULATORY_CARE_PROVIDER_SITE_OTHER)
Admission: EM | Admit: 2014-01-15 | Discharge: 2014-01-15 | Disposition: A | Discharge status: Home / Self Care | Payer: BC Managed Care – PPO | Source: Home / Self Care | Attending: Emergency Medicine | Admitting: Emergency Medicine

## 2014-01-15 ENCOUNTER — Inpatient Hospital Stay (HOSPITAL_COMMUNITY)
Admission: AD | Admit: 2014-01-15 | Discharge: 2014-01-15 | Discharge status: Against Medical Advice | Payer: BC Managed Care – PPO | Source: Ambulatory Visit | Attending: Obstetrics & Gynecology | Admitting: Obstetrics & Gynecology

## 2014-01-15 DIAGNOSIS — N949 Unspecified condition associated with female genital organs and menstrual cycle: Secondary | ICD-10-CM

## 2014-01-15 DIAGNOSIS — G8929 Other chronic pain: Secondary | ICD-10-CM

## 2014-01-15 DIAGNOSIS — R102 Pelvic and perineal pain: Principal | ICD-10-CM

## 2014-01-15 MED ORDER — DICLOFENAC SODIUM 75 MG PO TBEC: 75.0000 mg | DELAYED_RELEASE_TABLET | Freq: Two times a day (BID) | ORAL | Status: DC

## 2014-01-15 NOTE — Discharge Instructions (Signed)
Please take the voltaren as needed. Be cautious about breast feeding while taking this medication. Please follow up with the OB-GYN doctor.   Take Care,   Dr. Clinton SawyerWilliamson

## 2014-01-15 NOTE — MAU Note (Signed)
Patient is not in the lobby when called to triage. Registration states the patient left prior to getting registered.

## 2014-01-15 NOTE — ED Notes (Signed)
Pt c/o lower abd pain onset 1 month Seen at [facility] ED on 6/15 for similar sx Had bilateral tubal ligation w/some complications Sx today include swelling and pain; 8/10 Has appt w/GYN doc for poss endometriosis in the next 2 weeks Denies urinary sx, f/d; having normal BM Alert w/no signs of acute distress.

## 2014-01-15 NOTE — ED Provider Notes (Signed)
CSN: [id number]     Arrival date & time 01/15/14  1532 History   First MD Initiated Contact with Patient 01/15/14 1710     Chief Complaint  Patient presents with  . Abdominal Pain   (Consider location/radiation/quality/duration/timing/severity/associated sxs/prior Treatment) HPI  Abdominal Pain - chronic, left lower quadrant, has [facility] for this [facility]. Work up included CT scan, ultrasound of pelvis, and infectious disease cultures. All have been negative up to this point. She was recommended to follow up with an OB/GYN. She states that she has established new practice and will be seen within the next 2 weeks. She presents today with complaints of continued cramping and subjective swelling in her lower abdomen. She was taking naproxen for this issue, but the naproxen caused a skin outbreak last week. Therefore she stopped it. The skin lesions, which she describes as small white bumps or itching, improved within 2 days. She denies any vaginal discharge, bleeding, fever, nausea, vomiting, diarrhea or constipation.  Past Medical History  Diagnosis Date  . Environmental allergies   . Panic attacks   . Preterm labor   . Anemia   . Abnormal cervical Papanicolaou smear     cryo  . Pregnancy induced hypertension    Past Surgical History  Procedure Laterality Date  . Cryotherapy    . Tubal ligation    . Laparoscopic tubal ligation Bilateral 11/10/2013    Procedure: bilateral LAPAROSCOPIC TUBAL LIGATION with filshie clips;  Surgeon: Freddrick MarchKendra H. Tenny Crawoss, MD;  Location: [facility];  Service: Gynecology;  Laterality: Bilateral;   Family History  Problem Relation Age of Onset  . Hypertension Mother   . Hypertension Father   . Hyperlipidemia Father   . Diabetes Maternal Grandmother   . Cancer Paternal Grandmother     started in lung  . Hypertension Paternal Grandmother    History  Substance Use Topics  . Smoking status: Current Every Day Smoker --  0.25 packs/day for 5 years    Types: Cigarettes  . Smokeless tobacco: Never Used     Comment: stopped with preg  . Alcohol Use: No   OB History   Grav Para Term Preterm Abortions TAB SAB Ect Mult Living   2 2 2  0 0 0 0 0 0 2     Review of Systems Positive for her quadrant abdominal pain and abdominal swelling Negative for fever, chills, nausea, vomiting, diarrhea, constipation, vaginal bleeding, vaginal discharge Allergies  Hydrocodone and Naprosyn  Home Medications   Prior to Admission medications   Medication Sig Start Date End Date Taking? Authorizing Provider  diclofenac (VOLTAREN) 75 MG EC tablet Take 1 tablet (75 mg total) by mouth 2 (two) times daily. 01/15/14   Garnetta BuddyEdward Williamson V, MD  hydrochlorothiazide (HYDRODIURIL) 25 MG tablet Take 25 mg by mouth daily.    Historical Provider, MD  Pediatric Multiple Vit-C-FA (FLINSTONES GUMMIES OMEGA-3 DHA) CHEW Chew 2 tablets by mouth daily.    Historical Provider, MD   BP 162/101  Pulse 60  Temp(Src) 98.5 F (36.9 C) (Oral)  Resp 14  SpO2 100%  LMP 01/08/2014  Breastfeeding? No Physical Exam Gen: middle-age [place] female, pleasant, conversant, non-ill-appearing Vascular: regular rate and rhythm, no murmurs Lungs: clear to auscultation bilaterally Abdomen: soft, nondistended, tender to palpation left lower quadrant without rebound or guarding, no sign of acute abdomen ED Course  Procedures (including critical care time) Labs Review Labs Reviewed - No data to display  Imaging  Review No results found.   MDM   1. Chronic pelvic pain in female    Differential includes idiopathic, endometriosis, malignancy. No evidence of acute abdomen at this time. Pt appropriate for follow up with OB-GYN as outpatient. Will be given prescription of voltaren for symptom management.     Garnetta BuddyEdward Williamson V, MD 01/15/14 [phone]

## 2014-01-17 NOTE — ED Provider Notes (Signed)
Medical screening examination/treatment/procedure(s) were performed by a resident physician and as supervising physician I was immediately available for consultation/collaboration.  Alfred Harrel, M.D.   Shawn Dannenberg C Khyrin Trevathan, MD 01/17/14 0814 

## 2014-02-08 ENCOUNTER — Emergency Department (HOSPITAL_COMMUNITY): Payer: BC Managed Care – PPO

## 2014-02-08 ENCOUNTER — Encounter (HOSPITAL_COMMUNITY): Payer: Self-pay | Admitting: Emergency Medicine

## 2014-02-08 ENCOUNTER — Emergency Department (HOSPITAL_COMMUNITY)
Admission: EM | Admit: 2014-02-08 | Discharge: 2014-02-08 | Disposition: A | Discharge status: Home / Self Care | Payer: BC Managed Care – PPO | Attending: Emergency Medicine | Admitting: Emergency Medicine

## 2014-02-08 DIAGNOSIS — R42 Dizziness and giddiness: Secondary | ICD-10-CM | POA: Insufficient documentation

## 2014-02-08 DIAGNOSIS — Z862 Personal history of diseases of the blood and blood-forming organs and certain disorders involving the immune mechanism: Secondary | ICD-10-CM | POA: Insufficient documentation

## 2014-02-08 DIAGNOSIS — R0789 Other chest pain: Secondary | ICD-10-CM | POA: Insufficient documentation

## 2014-02-08 DIAGNOSIS — R5381 Other malaise: Secondary | ICD-10-CM | POA: Insufficient documentation

## 2014-02-08 DIAGNOSIS — F410 Panic disorder [episodic paroxysmal anxiety] without agoraphobia: Secondary | ICD-10-CM | POA: Insufficient documentation

## 2014-02-08 DIAGNOSIS — R079 Chest pain, unspecified: Secondary | ICD-10-CM

## 2014-02-08 DIAGNOSIS — R5383 Other fatigue: Secondary | ICD-10-CM

## 2014-02-08 DIAGNOSIS — M79606 Pain in leg, unspecified: Secondary | ICD-10-CM

## 2014-02-08 DIAGNOSIS — Z791 Long term (current) use of non-steroidal anti-inflammatories (NSAID): Secondary | ICD-10-CM | POA: Insufficient documentation

## 2014-02-08 DIAGNOSIS — F17200 Nicotine dependence, unspecified, uncomplicated: Secondary | ICD-10-CM | POA: Insufficient documentation

## 2014-02-08 DIAGNOSIS — N946 Dysmenorrhea, unspecified: Secondary | ICD-10-CM | POA: Insufficient documentation

## 2014-02-08 DIAGNOSIS — R0602 Shortness of breath: Secondary | ICD-10-CM | POA: Insufficient documentation

## 2014-02-08 DIAGNOSIS — M79609 Pain in unspecified limb: Secondary | ICD-10-CM | POA: Insufficient documentation

## 2014-02-08 DIAGNOSIS — M549 Dorsalgia, unspecified: Secondary | ICD-10-CM | POA: Insufficient documentation

## 2014-02-08 DIAGNOSIS — N921 Excessive and frequent menstruation with irregular cycle: Secondary | ICD-10-CM

## 2014-02-08 DIAGNOSIS — Z3202 Encounter for pregnancy test, result negative: Secondary | ICD-10-CM | POA: Insufficient documentation

## 2014-02-08 DIAGNOSIS — N920 Excessive and frequent menstruation with regular cycle: Secondary | ICD-10-CM | POA: Insufficient documentation

## 2014-02-08 DIAGNOSIS — Z79899 Other long term (current) drug therapy: Secondary | ICD-10-CM | POA: Insufficient documentation

## 2014-02-08 LAB — URINE MICROSCOPIC-ADD ON

## 2014-02-08 LAB — CBC WITH DIFFERENTIAL/PLATELET
Basophils Absolute: 0 10*3/uL (ref 0.0–0.1)
Basophils Relative: 1 % (ref 0–1)
Eosinophils Absolute: 0.1 10*3/uL (ref 0.0–0.7)
Eosinophils Relative: 5 % (ref 0–5)
HCT: 36.2 % (ref 36.0–46.0)
Hemoglobin: 12.3 g/dL (ref 12.0–15.0)
Lymphocytes Relative: 54 % — ABNORMAL HIGH (ref 12–46)
Lymphs Abs: 1.6 10*3/uL (ref 0.7–4.0)
MCH: 27.8 pg (ref 26.0–34.0)
MCHC: 34 g/dL (ref 30.0–36.0)
MCV: 81.9 fL (ref 78.0–100.0)
Monocytes Absolute: 0.4 10*3/uL (ref 0.1–1.0)
Monocytes Relative: 14 % — ABNORMAL HIGH (ref 3–12)
Neutro Abs: 0.7 10*3/uL — ABNORMAL LOW (ref 1.7–7.7)
Neutrophils Relative %: 26 % — ABNORMAL LOW (ref 43–77)
Platelets: 213 10*3/uL (ref 150–400)
RBC: 4.42 MIL/uL (ref 3.87–5.11)
RDW: 14 % (ref 11.5–15.5)
WBC: 2.8 10*3/uL — ABNORMAL LOW (ref 4.0–10.5)

## 2014-02-08 LAB — URINALYSIS, ROUTINE W REFLEX MICROSCOPIC
Bilirubin Urine: NEGATIVE
Glucose, UA: NEGATIVE mg/dL
Hgb urine dipstick: NEGATIVE
Ketones, ur: NEGATIVE mg/dL
Nitrite: NEGATIVE
Protein, ur: NEGATIVE mg/dL
Specific Gravity, Urine: 1.011 (ref 1.005–1.030)
Urobilinogen, UA: 1 mg/dL (ref 0.0–1.0)
pH: 6 (ref 5.0–8.0)

## 2014-02-08 LAB — WET PREP, GENITAL
Clue Cells Wet Prep HPF POC: NONE SEEN
Trich, Wet Prep: NONE SEEN
Yeast Wet Prep HPF POC: NONE SEEN

## 2014-02-08 LAB — I-STAT TROPONIN, ED
Troponin i, poc: 0 ng/mL (ref 0.00–0.08)
Troponin i, poc: 0.01 ng/mL (ref 0.00–0.08)

## 2014-02-08 LAB — COMPREHENSIVE METABOLIC PANEL
ALT: 6 U/L (ref 0–35)
AST: 15 U/L (ref 0–37)
Albumin: 3.9 g/dL (ref 3.5–5.2)
Alkaline Phosphatase: 61 U/L (ref 39–117)
Anion gap: 11 (ref 5–15)
BUN: 5 mg/dL — ABNORMAL LOW (ref 6–23)
CO2: 23 mEq/L (ref 19–32)
Calcium: 9 mg/dL (ref 8.4–10.5)
Chloride: 104 mEq/L (ref 96–112)
Creatinine, Ser: 0.83 mg/dL (ref 0.50–1.10)
GFR calc Af Amer: >90 mL/min (ref >90)
GFR calc non Af Amer: >90 mL/min (ref >90)
Glucose, Bld: 92 mg/dL (ref 70–99)
Potassium: 4.1 mEq/L (ref 3.7–5.3)
Sodium: 138 mEq/L (ref 137–147)
Total Bilirubin: 1 mg/dL (ref 0.3–1.2)
Total Protein: 6.9 g/dL (ref 6.0–8.3)

## 2014-02-08 LAB — PRO B NATRIURETIC PEPTIDE: Pro B Natriuretic peptide (BNP): 245.9 pg/mL — ABNORMAL HIGH (ref 0–125)

## 2014-02-08 LAB — LIPASE, BLOOD: Lipase: 14 U/L (ref 11–59)

## 2014-02-08 LAB — POC URINE PREG, ED: Preg Test, Ur: NEGATIVE

## 2014-02-08 LAB — D-DIMER, QUANTITATIVE: D-Dimer, Quant: 0.27 ug/mL-FEU (ref 0.00–0.48)

## 2014-02-08 MED ORDER — MORPHINE SULFATE 4 MG/ML IJ SOLN
4.0000 mg | Freq: Once | INTRAMUSCULAR | Status: AC
  Administered 2014-02-08: 4 mg via INTRAVENOUS
  Filled 2014-02-08: qty 1

## 2014-02-08 MED ORDER — ONDANSETRON HCL 4 MG/2ML IJ SOLN
4.0000 mg | Freq: Once | INTRAMUSCULAR | Status: AC
  Administered 2014-02-08: 4 mg via INTRAVENOUS
  Filled 2014-02-08: qty 2

## 2014-02-08 MED ORDER — SODIUM CHLORIDE 0.9 % IV BOLUS (SEPSIS)
500.0000 mL | Freq: Once | INTRAVENOUS | Status: AC
  Administered 2014-02-08: 500 mL via INTRAVENOUS

## 2014-02-08 MED ORDER — SODIUM CHLORIDE 0.9 % IV BOLUS (SEPSIS)
1000.0000 mL | Freq: Once | INTRAVENOUS | Status: AC
  Administered 2014-02-08: 1000 mL via INTRAVENOUS

## 2014-02-08 MED ORDER — MORPHINE SULFATE 2 MG/ML IJ SOLN
2.0000 mg | Freq: Once | INTRAMUSCULAR | Status: AC
  Administered 2014-02-08: 2 mg via INTRAVENOUS
  Filled 2014-02-08: qty 1

## 2014-02-08 MED ORDER — TRAMADOL HCL 50 MG PO TABS: 50.0000 mg | ORAL_TABLET | Freq: Two times a day (BID) | ORAL | Status: DC | PRN

## 2014-02-08 NOTE — Discharge Instructions (Signed)
Please call your doctor for a followup appointment within 24-48 hours. When you talk to your doctor please let them know that you were seen in the emergency department and have them acquire all of your records so that they can discuss the findings with you and formulate a treatment plan to fully care for your new and ongoing problems. Please keep appointment with OBGYN on February 14, 2014 Please get repeat Ultrasound at this time Please rest and stay hydrated  Please take pain medications as prescribed - please take on a full stomach  Please avoid any physical or strenuous activity  Please continue to monitor symptoms closely and if symptoms are to worsen or change (fever greater than 101, chills, sweating, nausea, vomiting, chest pain, shortness of breath, difficulty breathing, numbness, tingling, weakness, fall, injury, inability to control urine or bowel movements, headache, dizziness, weakness) please report back to the ED immediately   Chest Pain (Nonspecific) It is often hard to give a specific diagnosis for the cause of chest pain. There is always a chance that your pain could be related to something serious, such as a heart attack or a blood clot in the lungs. You need to follow up with your health care provider for further evaluation. CAUSES   Heartburn.  Pneumonia or bronchitis.  Anxiety or stress.  Inflammation around your heart (pericarditis) or lung (pleuritis or pleurisy).  A blood clot in the lung.  A collapsed lung (pneumothorax). It can develop suddenly on its own (spontaneous pneumothorax) or from trauma to the chest.  Shingles infection (herpes zoster virus). The chest wall is composed of bones, muscles, and cartilage. Any of these can be the source of the pain.  The bones can be bruised by injury.  The muscles or cartilage can be strained by coughing or overwork.  The cartilage can be affected by inflammation and become sore (costochondritis). DIAGNOSIS  Lab tests or  other studies may be needed to find the cause of your pain. Your health care provider may have you take a test called an ambulatory electrocardiogram (ECG). An ECG records your heartbeat patterns over a 24-hour period. You may also have other tests, such as:  Transthoracic echocardiogram (TTE). During echocardiography, sound waves are used to evaluate how blood flows through your heart.  Transesophageal echocardiogram (TEE).  Cardiac monitoring. This allows your health care provider to monitor your heart rate and rhythm in real time.  Holter monitor. This is a portable device that records your heartbeat and can help diagnose heart arrhythmias. It allows your health care provider to track your heart activity for several days, if needed.  Stress tests by exercise or by giving medicine that makes the heart beat faster. TREATMENT   Treatment depends on what may be causing your chest pain. Treatment may include:  Acid blockers for heartburn.  Anti-inflammatory medicine.  Pain medicine for inflammatory conditions.  Antibiotics if an infection is present.  You may be advised to change lifestyle habits. This includes stopping smoking and avoiding alcohol, caffeine, and chocolate.  You may be advised to keep your head raised (elevated) when sleeping. This reduces the chance of acid going backward from your stomach into your esophagus. Most of the time, nonspecific chest pain will improve within 2-3 days with rest and mild pain medicine.  HOME CARE INSTRUCTIONS   If antibiotics were prescribed, take them as directed. Finish them even if you start to feel better.  For the next few days, avoid physical activities that bring on chest  pain. Continue physical activities as directed.  Do not use any tobacco products, including cigarettes, chewing tobacco, or electronic cigarettes.  Avoid drinking alcohol.  Only take medicine as directed by your health care provider.  Follow your health care  provider's suggestions for further testing if your chest pain does not go away.  Keep any follow-up appointments you made. If you do not go to an appointment, you could develop lasting (chronic) problems with pain. If there is any problem keeping an appointment, call to reschedule. SEEK MEDICAL CARE IF:   Your chest pain does not go away, even after treatment.  You have a rash with blisters on your chest.  You have a fever. SEEK IMMEDIATE MEDICAL CARE IF:   You have increased chest pain or pain that spreads to your arm, neck, jaw, back, or abdomen.  You have shortness of breath.  You have an increasing cough, or you cough up blood.  You have severe back or abdominal pain.  You feel nauseous or vomit.  You have severe weakness.  You faint.  You have chills. This is an emergency. Do not wait to see if the pain will go away. Get medical help at once. Call your local emergency services (911 in U.S.). Do not drive yourself to the hospital. MAKE SURE YOU:   Understand these instructions.  Will watch your condition.  Will get help right away if you are not doing well or get worse. Document Released: 04/22/2005 Document Revised: 07/18/2013 Document Reviewed: 02/16/2008 [facility] Patient Information 2015 [place], [place]. This information is not intended to replace advice given to you by your health care provider. Make sure you discuss any questions you have with your health care provider.   Abnormal Uterine Bleeding Abnormal uterine bleeding can affect women at various stages in life, including teenagers, women in their reproductive years, pregnant women, and women who have reached menopause. Several kinds of uterine bleeding are considered abnormal, including:  Bleeding or spotting between periods.   Bleeding after sexual intercourse.   Bleeding that is heavier or more than normal.   Periods that last longer than usual.  Bleeding after menopause.  Many cases of abnormal  uterine bleeding are minor and simple to treat, while others are more serious. Any type of abnormal bleeding should be evaluated by your health care provider. Treatment will depend on the cause of the bleeding. HOME CARE INSTRUCTIONS Monitor your condition for any changes. The following actions may help to alleviate any discomfort you are experiencing:  Avoid the use of tampons and douches as directed by your health care provider.  Change your pads frequently. You should get regular pelvic exams and Pap tests. Keep all follow-up appointments for diagnostic tests as directed by your health care provider.  SEEK MEDICAL CARE IF:   Your bleeding lasts more than 1 week.   You feel dizzy at times.  SEEK IMMEDIATE MEDICAL CARE IF:   You pass out.   You are changing pads every 15 to 30 minutes.   You have abdominal pain.  You have a fever.   You become sweaty or weak.   You are passing large blood clots from the vagina.   You start to feel nauseous and vomit. MAKE SURE YOU:   Understand these instructions.  Will watch your condition.  Will get help right away if you are not doing well or get worse. Document Released: 07/13/2005 Document Revised: 07/18/2013 Document Reviewed: 02/09/2013 [facility] Patient Information 2015 [place], [place]. This information is not  intended to replace advice given to you by your health care provider. Make sure you discuss any questions you have with your health care provider.   Emergency Department Resource Guide 1) Find a Doctor and Pay Out of Pocket Although you won't have to find out who is covered by your insurance plan, it is a good idea to ask around and get recommendations. You will then need to call the office and see if the doctor you have chosen will accept you as a new patient and what types of options they offer for patients who are self-pay. Some doctors offer discounts or will set up payment plans for their patients who do not have  insurance, but you will need to ask so you aren't surprised when you get to your appointment.  2) Contact Your Local Health Department Not all health departments have doctors that can see patients for sick visits, but many do, so it is worth a call to see if yours does. If you don't know where your local health department is, you can check in your phone book. The CDC also has a tool to help you locate your state's health department, and many state websites also have listings of all of their local health departments.  3) Find a Walk-in Clinic If your illness is not likely to be very severe or complicated, you may want to try a walk in clinic. These are popping up all over the country in pharmacies, drugstores, and shopping centers. They're usually staffed by nurse practitioners or physician assistants that have been trained to treat common illnesses and complaints. They're usually fairly quick and inexpensive. However, if you have serious medical issues or chronic medical problems, these are probably not your best option.  No Primary Care Doctor: - Call Health Connect at  [phone] - they can help you locate a primary care doctor that  accepts your insurance, provides certain services, etc. - Physician Referral Service- [phone]  Chronic Pain Problems: Organization         Address  Phone   Notes  [facility]  [phone] Patients need to be referred by their primary care doctor.   Medication Assistance: Organization         Address  Phone   Notes  [facility] Medication [facility] [address] [place] [place], [place] [address] [phone] --Must be a resident of [facility] -- Must have NO insurance coverage whatsoever (no Medicaid/ Medicare, etc.) -- The pt. MUST have a primary care doctor that directs their care regularly and follows them in the community   [facility]  [phone]   [employer]  [phone]    Agencies that provide  inexpensive medical care: Organization         Address  Phone   Notes  [facility]  [phone]   [facility]    [phone]   [facility] [address] [place], [place] [address] [phone]   [facility] [address] [place], [place] [phone]   Planned Parenthood    [phone]   [facility]    [phone]   [facility]  [address], [place] Phone:  [phone], Fax:  [phone] Hours of Operation:  9 am - 6 pm, M-F.  Also accepts Medicaid/Medicare and self-pay.  [facility]  [address],  [place] Phone: [phone], Fax: [phone]. Hours of Operation:  8:30 am - 5:30 pm, M-F.  Also accepts Medicaid and self-pay.  [facility] [place] [address], [place] Phone: [phone]   [facility] [address] Natasha Bence [place], [place] [phone], Ext. [phone] Mondays & Thursdays: 7-9 AM.  First 15 patients are seen on a first come, first serve basis.    Medicaid-accepting [facility] Providers:  Organization         Address  Phone   Notes  [facility] [address], [place] [phone] Also accepts self-pay patients.  [facility] [address] Laurell Josephs [place], [place]  [phone]   [facility] [address], [place] [phone]   [facility] [address], [place] [phone]   Renaye Rakers [address], [place]   [phone] Only accepts [place] patients after they have their name applied to their card.   Self-Pay (no insurance) in [facility]:  Organization         Address  Phone   Notes  Sickle Cell Patients, [facility] [address] [place], [place] [phone]   [facility] [address] [place], [place] [phone]   [facility] [place]  [address], [place] [phone]   [facility]/Dr. Osei-Bonsu  [address], [place] or [address], [place] [phone] Phone number for both [place] and [place] locations is the same.  [facility] and [facility] [address], [place] [phone]   [facility] [address] [place] or [address] [phone]   [facility] [address] [place] [phone], phone; [phone], fax Sees patients 1st and 3rd Saturday of every month.  Must not qualify for public or private insurance (i.e. Medicaid, Medicare, [place] Health Choice, Veterans' Benefits)  Household income should be no more than 200% of the poverty level The clinic cannot treat you if you are pregnant or think you are pregnant  Sexually transmitted diseases are not treated at the clinic.    Dental Care: Organization         Address  Phone  Notes  [facility] Department of [facility] [address] [place], [place] [phone] Accepts children up to age 54 who are enrolled in [place] or [place] Health Choice; pregnant women with a Medicaid card; and children who have applied for Medicaid or [place] Health Choice, but were declined, whose parents can pay a reduced fee at time of service.  [facility] Department of [facility]  [address], [place] [phone] Accepts children up to age 49 who are enrolled in [place] or [place] Health Choice; pregnant women with a Medicaid card; and children who have applied for Medicaid or  Health Choice, but were declined, whose parents can pay a reduced fee at time of service.  [facility]  [address] [place], [place] [phone] Patients are seen by appointment only. Walk-ins are  not accepted. [facility] will see patients 55 years of age and older. Monday - Tuesday (8am-5pm) Most Wednesdays (8:30-5pm) $30 per visit, cash only  [employer] [facility] [employer] PROGRAM  [address], [facility] [phone] Patients are seen by appointment only. Walk-ins are not accepted. [facility] will see patients 68 years of age and older. One Wednesday Evening (Monthly: Volunteer Based).  $30 per visit, cash only  [employer]  [phone] for adults; Children under age 23, call Graduate Pediatric Dentistry at [phone]. Children aged 83-14, please call [phone] to request a pediatric application.  Dental services are provided in all areas of dental care including fillings, crowns and bridges, complete and partial dentures, implants, gum treatment, root canals, and extractions. Preventive care is also provided. Treatment is provided to both adults and children. Patients are selected via a lottery and there is often a waiting list.   [facility] [address], [place]  [phone] [email or web address]   [facility] [address] [place], [place] [phone], Ext. [phone] Second and Fourth Thursday of each month, opens at 6:30 AM; Clinic ends at 9 AM.  Patients are seen on a first-come first-served basis, and a limited number are seen during each clinic.   [facility]  [address] Ether Griffins [place], [place] [phone]   Eligibility Requirements You must have lived in [place], [place], or [place] counties for at least the last three months.   You cannot be eligible for state or federal sponsored [place], including [employer], [place], or [employer].   You generally cannot be eligible for healthcare insurance through your employer.    How to apply: Eligibility screenings are held every Tuesday and Wednesday afternoon from 1:00 pm until 4:00 pm. You do not need an appointment for  the interview!  [facility] [address] [place], [place] [phone]   [facility]  [phone]   [facility]  [phone]   [facility] Health Department  [phone]    Behavioral Health Resources in the Community: Intensive Outpatient Programs Organization         Address  Phone  Notes  [facility] Services [address], [place], [place] [phone]   [facility] Outpatient [address] [employer] [employer] [place], [place] [phone]   [facility]: [facility] [address], [place], [place]  [phone]   [facility] Mental Health [address]  [place], [place] [phone] or [phone]   Substance Abuse Resources Organization         Address  Phone  Notes  [facility] Drug Services  [phone]   [facility]  [phone]   [facility] [place]  [phone]   Floydene Flock  [phone]   Residential & Outpatient Substance Abuse Program  [phone]   Psychological Services Organization         Address  Phone  Notes  [facility] Behavioral Health  [phone]   [facility] Services  [phone]   [facility] Mental Health [address] [place] [phone] or [phone]    Mobile Crisis Teams Organization         Address  Phone  Notes  [facility], Mobile Crisis Care Unit  [phone]   [facility]  [address] [place], [place] [phone]   Doristine Locks [address] [place] [phone]    Self-Help/Support Groups Organization         Address  Phone             Notes  [facility] [place] - variety of  support groups  [phone] Call for more information  Narcotics Anonymous (NA), [facility] [address], [place]  2 meetings at this location   Residential Sports administratorTreatment Programs Organization         Address  Phone  Notes  [facility]  [address],    [place]  [phone]   [facility]  [address], [place] [phone], [place], [place] [phone]   [facility] [address], [place] [phone] Admissions: 8am-3pm M-F  [facility] [address]    [place], [place] [phone]   [facility] [address], [place], [place] [phone]   [facility] [address]  [place], [place] [phone]   [facility] - Intensive Outpatient [address], [place], [place] [phone]   [facility] ([facility] [address]  [place], [place] [phone] or [phone]   [facility] ([facility]) [address] [place], [place] [phone] Accepts Medicaid  [facility] [place] [address]  [place] [phone] Substance Abuse/Addiction Treatment   [facility] Behavioral Health Resources Organization         Address  Phone  Notes  [facility]  [phone]   Angie FavaJulie Brannon, PhD [address], [address] [place], [place]   [phone](336) [phone] or [phone]   [facility]   [address] [place], [place] [phone]) [phone]   [facility] [address], [place], [place] [phone](336) [phone] Insurance/Medicaid/sponsorship through [facility]  [facility] [address]                                    [place], [place] [phone]) [phone] Therapy/tele-psych/case  [facility] [address]   [place], [place] [phone]    Dr. Lolly MustacheArfeen  [phone](336) [phone]   [facility]  [employer] [facility] 1) [address], [place] 2) [address], [place] 3)  [address], [place] [phone](336) [phone]  [phone]   [facility] Child Abuse Hotline [phone](336) 4044615860 or [phone]) [phone] (After Hours)

## 2014-02-08 NOTE — ED Provider Notes (Signed)
CSN: [id number]     Arrival date & time 02/08/14  1325 History   First MD Initiated Contact with Patient 02/08/14 1457     Chief Complaint  Patient presents with  . Chest Pain  . Abdominal Pain  . Leg Pain     (Consider location/radiation/quality/duration/timing/severity/associated sxs/prior Treatment) The history is provided by the patient. No language interpreter was used.  Brenda Blankenship is a 28 year old female with past medical history of panic attacks, premature labor, anemia, abnormal cervical Pap smear, pregnancy-induced hypertension, tubal ligation performed in April 2015 presenting to the ED with multiple complaints. Patient reported that she's been having ongoing lower abdominal pain that started approximately a week and half to 2 weeks ago when she started to have her menstrual cycle. Patient reported that she has irregular menstrual cycles and is being followed by OB/GYN-reported that this is been ongoing all of her life. Stated that she has appointment on 03/08/2014 with OB/GYN regarding ruling out for endometriosis. As per patient, reported that she's been experiencing bilateral lower quadrants swelling that has been ongoing since April 2015. Patient reports she's been experiencing lower back pain and bilateral leg burning circumferentially that started approximately 2 weeks ago. Stated that yesterday she had a fall secondary to her legs feeling weak. Stated that she's been having chest pain that started earlier this morning described as a "well was sitting on her chest" a pressure sensation that is heavy and that is constant with intermittent shortness of breath. Patient reported that the pain occurred at approximately 10 - 11:00 AM this morning while the patient was meditating. Patient reported that she's noticed she's been feeling mildly lightheaded-denied staying hydrated. Stated that she smokes approximately a quarter of a pack of cigarettes per day. Denied fever, chills, numbness,  tingling, injury, fall, difficulty breathing, cough, birth control, long travels, weeping, hematemesis, nausea, vomiting, melena, hematochezia, vaginal discharge, vaginal pain, urinary bowel incontinence. [facility]  Past Medical History  Diagnosis Date  . Environmental allergies   . Panic attacks   . Preterm labor   . Anemia   . Abnormal cervical Papanicolaou smear     cryo  . Pregnancy induced hypertension    Past Surgical History  Procedure Laterality Date  . Cryotherapy    . Tubal ligation    . Laparoscopic tubal ligation Bilateral 11/10/2013    Procedure: bilateral LAPAROSCOPIC TUBAL LIGATION with filshie clips;  Surgeon: Freddrick March. Tenny Craw, MD;  Location: [facility];  Service: Gynecology;  Laterality: Bilateral;   Family History  Problem Relation Age of Onset  . Hypertension Mother   . Hypertension Father   . Hyperlipidemia Father   . Diabetes Maternal Grandmother   . Cancer Paternal Grandmother     started in lung  . Hypertension Paternal Grandmother    History  Substance Use Topics  . Smoking status: Current Every Day Smoker -- 0.25 packs/day for 5 years    Types: Cigarettes  . Smokeless tobacco: Never Used     Comment: stopped with preg  . Alcohol Use: No   OB History   Grav Para Term Preterm Abortions TAB SAB Ect Mult Living   2 2 2  0 0 0 0 0 0 2     Review of Systems  Constitutional: Negative for fever and chills.  HENT: Negative for trouble swallowing.   Respiratory: Positive for chest tightness and shortness of breath. Negative for cough and wheezing.   Cardiovascular: Positive for chest pain.  Gastrointestinal: Positive for  abdominal pain. Negative for nausea, vomiting, diarrhea, constipation, blood in stool and anal bleeding.  Genitourinary: Positive for vaginal bleeding (irregular menstrual cycles that are chronic). Negative for dysuria, hematuria, vaginal discharge, vaginal pain and pelvic pain.  Musculoskeletal: Positive for back pain.  Negative for neck pain.  Neurological: Positive for weakness and light-headedness. Negative for dizziness, numbness and headaches.      Allergies  Hydrocodone and Naprosyn  Home Medications   Prior to Admission medications   Medication Sig Start Date End Date Taking? Authorizing Provider  diclofenac (VOLTAREN) 75 MG EC tablet Take 1 tablet (75 mg total) by mouth 2 (two) times daily. 01/15/14  Yes Garnetta Buddy, MD  hydrochlorothiazide (HYDRODIURIL) 25 MG tablet Take 25 mg by mouth daily.   Yes Historical Provider, MD  ibuprofen (ADVIL,MOTRIN) 200 MG tablet Take 200 mg by mouth every 6 (six) hours as needed for moderate pain.   Yes Historical Provider, MD  Pediatric Multiple Vit-C-FA (FLINSTONES GUMMIES OMEGA-3 DHA) CHEW Chew 2 tablets by mouth daily.   Yes Historical Provider, MD  traMADol (ULTRAM) 50 MG tablet Take 50 mg by mouth every 6 (six) hours as needed for moderate pain.   Yes Historical Provider, MD  traMADol (ULTRAM) 50 MG tablet Take 1 tablet (50 mg total) by mouth every 12 (twelve) hours as needed. 02/08/14   Warwick Nick, PA-C   BP 141/77  Pulse 56  Temp(Src) 98.1 F (36.7 C) (Oral)  Resp 14  SpO2 100%  LMP 01/25/2014 Physical Exam  Nursing note and vitals reviewed. Constitutional: She is oriented to person, place, and time. She appears well-developed and well-nourished. No distress.  Patient found sleeping in bed when this provider walks into the room. Negative signs of respiratory distress.  HENT:  Head: Normocephalic and atraumatic.  Mouth/Throat: Oropharynx is clear and moist. No oropharyngeal exudate.  Eyes: Conjunctivae and EOM are normal. Pupils are equal, round, and reactive to light. Right eye exhibits no discharge. Left eye exhibits no discharge.  Neck: Normal range of motion. Neck supple. No tracheal deviation present.  Negative neck stiffness Negative nuchal rigidity Negative cervical lymphadenopathy Negative meningeal signs Negative pain upon  palpation to C-spine  Cardiovascular: Normal rate, regular rhythm and normal heart sounds.  Exam reveals no friction rub.   No murmur heard. Pulses:      Radial pulses are 2+ on the right side, and 2+ on the left side.       Dorsalis pedis pulses are 2+ on the right side, and 2+ on the left side.  Cap refill less than 3 seconds Negative swelling or pitting edema identified to lower extremities bilaterally Negative Homans sign bilaterally  Pulmonary/Chest: Effort normal and breath sounds normal. No respiratory distress. She has no wheezes. She has no rales. She exhibits tenderness.  Patient is able to speak in full sentences without difficulty Negative use of accessory muscles Negative stridor  Negative crepitus upon palpation to chest wall. Reproducible pain upon palpation localized to the left side of the chest. Negative signs of trauma or ecchymosis noted.  Abdominal: Soft. Bowel sounds are normal. She exhibits no distension. There is tenderness. There is no rebound and no guarding.  Negative abdominal distention Bowel sounds normal active in all 4 quadrants Abdomen soft upon palpation Negative guarding or rigidity noted Negative peritoneal signs  Mild discomfort upon palpation to the suprapubic region and bilateral lower quadrants-mainly pelvic region.  Genitourinary: Vagina normal. No vaginal discharge found.  Pelvic Exam: negative swelling, erythema, inflammation, lesions, sores,  deformities, masses, lesions noted to the external genitalia. Negative swelling, erythema, inflammation, lesions, sores, deformities noted to the vaginal canal. Negative bright red blood in the vaginal vault. Cervix noted with negative friability, swelling, erythema, inflammation. Negative discharge noted. Negative CMT, bilateral adnexal tenderness. Mild discomfort upon palpation to suprapubic region. Exam chaperoned with tech  Musculoskeletal: Normal range of motion.  Negative deformities identified to the  spine. Discomfort upon palpation to the lumbosacral region-mid spinal and paravertebral regions bilaterally. Negative signs of abnormalities or deformities-negative bulging or signs of trauma. Full ROM to upper and lower extremities without difficulty noted, negative ataxia noted.  Lymphadenopathy:    She has no cervical adenopathy.  Neurological: She is alert and oriented to person, place, and time. No cranial nerve deficit. She exhibits normal muscle tone. Coordination normal.  Cranial nerves III-XII grossly intact Strength 5+/5+ to upper and lower extremities bilaterally with resistance applied, equal distribution noted Equal grip strength Sensation intact with differentiation to sharp and dull touch Negative facial drooping Negative slurred speech Negative aphasia Patient able to follow commands well Motion with negative ataxia noted GCS 15 Fine motor skills intact Gait proper with negative step offs or sway - negative ataxic gait.    Skin: Skin is warm and dry. No rash noted. She is not diaphoretic. No erythema.  Psychiatric: She has a normal mood and affect. Her behavior is normal. Thought content normal.    ED Course  Procedures (including critical care time)  Results for orders placed during the hospital encounter of 02/08/14  WET PREP, GENITAL      Result Value Ref Range   Yeast Wet Prep HPF POC NONE SEEN  NONE SEEN   Trich, Wet Prep NONE SEEN  NONE SEEN   Clue Cells Wet Prep HPF POC NONE SEEN  NONE SEEN   WBC, Wet Prep HPF POC FEW (*) NONE SEEN  CBC WITH DIFFERENTIAL      Result Value Ref Range   WBC 2.8 (*) 4.0 - 10.5 K/uL   RBC 4.42  3.87 - 5.11 MIL/uL   Hemoglobin 12.3  12.0 - 15.0 g/dL   HCT 46.9  62.9 - 52.8 %   MCV 81.9  78.0 - 100.0 fL   MCH 27.8  26.0 - 34.0 pg   MCHC 34.0  30.0 - 36.0 g/dL   RDW 41.3  24.4 - 01.0 %   Platelets 213  150 - 400 K/uL   Neutrophils Relative % 26 (*) 43 - 77 %   Lymphocytes Relative 54 (*) 12 - 46 %   Monocytes Relative 14 (*)  3 - 12 %   Eosinophils Relative 5  0 - 5 %   Basophils Relative 1  0 - 1 %   Neutro Abs 0.7 (*) 1.7 - 7.7 K/uL   Lymphs Abs 1.6  0.7 - 4.0 K/uL   Monocytes Absolute 0.4  0.1 - 1.0 K/uL   Eosinophils Absolute 0.1  0.0 - 0.7 K/uL   Basophils Absolute 0.0  0.0 - 0.1 K/uL   RBC Morphology RARE NRBCs     Smear Review LARGE PLATELETS PRESENT    COMPREHENSIVE METABOLIC PANEL      Result Value Ref Range   Sodium 138  137 - 147 mEq/L   Potassium 4.1  3.7 - 5.3 mEq/L   Chloride 104  96 - 112 mEq/L   CO2 23  19 - 32 mEq/L   Glucose, Bld 92  70 - 99 mg/dL   BUN 5 (*) 6 -  23 mg/dL   Creatinine, Ser 4.250.83  0.50 - 1.10 mg/dL   Calcium 9.0  8.4 - 95.610.5 mg/dL   Total Protein 6.9  6.0 - 8.3 g/dL   Albumin 3.9  3.5 - 5.2 g/dL   AST 15  0 - 37 U/L   ALT 6  0 - 35 U/L   Alkaline Phosphatase 61  39 - 117 U/L   Total Bilirubin 1.0  0.3 - 1.2 mg/dL   GFR calc non Af Amer >90  >90 mL/min   GFR calc Af Amer >90  >90 mL/min   Anion gap 11  5 - 15  D-DIMER, QUANTITATIVE      Result Value Ref Range   D-Dimer, Quant 0.27  0.00 - 0.48 ug/mL-FEU  LIPASE, BLOOD      Result Value Ref Range   Lipase 14  11 - 59 U/L  URINALYSIS, ROUTINE W REFLEX MICROSCOPIC      Result Value Ref Range   Color, Urine YELLOW  YELLOW   APPearance CLOUDY (*) CLEAR   Specific Gravity, Urine 1.011  1.005 - 1.030   pH 6.0  5.0 - 8.0   Glucose, UA NEGATIVE  NEGATIVE mg/dL   Hgb urine dipstick NEGATIVE  NEGATIVE   Bilirubin Urine NEGATIVE  NEGATIVE   Ketones, ur NEGATIVE  NEGATIVE mg/dL   Protein, ur NEGATIVE  NEGATIVE mg/dL   Urobilinogen, UA 1.0  0.0 - 1.0 mg/dL   Nitrite NEGATIVE  NEGATIVE   Leukocytes, UA TRACE (*) NEGATIVE  PRO B NATRIURETIC PEPTIDE      Result Value Ref Range   Pro B Natriuretic peptide (BNP) 245.9 (*) 0 - 125 pg/mL  URINE MICROSCOPIC-ADD ON      Result Value Ref Range   Squamous Epithelial / LPF FEW (*) RARE   WBC, UA 3-6  <3 WBC/hpf   RBC / HPF 0-2  <3 RBC/hpf   Bacteria, UA FEW (*) RARE  I-STAT  TROPOININ, ED      Result Value Ref Range   Troponin i, poc 0.00  0.00 - 0.08 ng/mL   Comment 3           POC URINE PREG, ED      Result Value Ref Range   Preg Test, Ur NEGATIVE  NEGATIVE  I-STAT TROPOININ, ED      Result Value Ref Range   Troponin i, poc 0.01  0.00 - 0.08 ng/mL   Comment 3             Labs Review Labs Reviewed  WET PREP, GENITAL - Abnormal; Notable for the following:    WBC, Wet Prep HPF POC FEW (*)    All other components within normal limits  CBC WITH DIFFERENTIAL - Abnormal; Notable for the following:    WBC 2.8 (*)    Neutrophils Relative % 26 (*)    Lymphocytes Relative 54 (*)    Monocytes Relative 14 (*)    Neutro Abs 0.7 (*)    All other components within normal limits  COMPREHENSIVE METABOLIC PANEL - Abnormal; Notable for the following:    BUN 5 (*)    All other components within normal limits  URINALYSIS, ROUTINE W REFLEX MICROSCOPIC - Abnormal; Notable for the following:    APPearance CLOUDY (*)    Leukocytes, UA TRACE (*)    All other components within normal limits  PRO B NATRIURETIC PEPTIDE - Abnormal; Notable for the following:    Pro B Natriuretic peptide (BNP) 245.9 (*)  All other components within normal limits  URINE MICROSCOPIC-ADD ON - Abnormal; Notable for the following:    Squamous Epithelial / LPF FEW (*)    Bacteria, UA FEW (*)    All other components within normal limits  GC/CHLAMYDIA PROBE AMP  D-DIMER, QUANTITATIVE  LIPASE, BLOOD  PATHOLOGIST SMEAR REVIEW  I-STAT TROPOININ, ED  POC URINE PREG, ED  Rosezena Sensor, ED    Imaging Review Dg Chest 2 View  02/08/2014   CLINICAL DATA:  Chest pain.  EXAM: CHEST  2 VIEW  COMPARISON:  November 20, 2013.  FINDINGS: The heart size and mediastinal contours are within normal limits. Both lungs are clear. No pneumothorax or pleural effusion is noted. The visualized skeletal structures are unremarkable.  IMPRESSION: No acute cardiopulmonary abnormality seen.   Electronically Signed   By:  Roque Lias M.D.   On: 02/08/2014 16:00   Dg Lumbar Spine Complete  02/08/2014   CLINICAL DATA:  Low back pain extending to the buttocks and legs.  EXAM: LUMBAR SPINE - COMPLETE 4+ VIEW  COMPARISON:  CT 12/07/2013  FINDINGS: There is somewhat exaggerated lumbar lordosis. L5 has some transitional features on the right. Disc space heights are normal. No pars defect or slippage. No fracture or focal lesion. Sacroiliac joints appear normal.  IMPRESSION: L5 has transitional features. Somewhat exaggerated lordosis. These findings could be associated with low back pain. No definite significant finding however.   Electronically Signed   By: Paulina Fusi M.D.   On: 02/08/2014 16:00   US Transvaginal Non-ob  02/08/2014   CLINICAL DATA:  Pelvic pain and vaginal bleeding  EXAM: TRANSABDOMINAL AND TRANSVAGINAL ULTRASOUND OF PELVIS  DOPPLER ULTRASOUND OF OVARIES  TECHNIQUE: Study was performed transabdominally to optimize pelvic field of view evaluation and transvaginally to optimize internal visceral architecture evaluation. Ovarian Doppler ultrasound was also performed  COMPARISON:  Nov 29, 2013  FINDINGS: Uterus  Measurements: 7.4 x 3.7 x 4.9 cm. No fibroids or other mass visualized. Uterus is anteverted.  Endometrium  Thickness: 7 mm. No focal abnormality visualized. Endometrial contour is smooth.  Right ovary  Measurements: 2.6 x 2.8 x 2.3 cm. Normal appearance/no adnexal mass. There is a somewhat complex small mass in the right ovary measuring 1.5 x 1.3 x 1.9 cm.  Left ovary  Measurements: 3.4 x 2.4 x 2.3 cm. Normal appearance/no adnexal mass.  Pulsed Doppler evaluation of both ovaries demonstrates normal low-resistance arterial and venous waveforms bilaterally. The peak systolic velocity in the right ovary is 11 cm/sec. The peak systolic velocity in the left ovary is 7 cm/sec.  Other findings  No free fluid.  IMPRESSION: Probable hemorrhagic follicle in the right ovary. Short-interval follow up ultrasound in 6-12  weeks is recommended, preferably during the week following the patient's normal menses. No evidence of ovarian torsion on either side. Study otherwise unremarkable. Fallopian tubes are not seen to be dilated in comparison with recent prior study.   Electronically Signed   By: Bretta Bang M.D.   On: 02/08/2014 21:00   US Pelvis Complete  02/08/2014   CLINICAL DATA:  Pelvic pain and vaginal bleeding  EXAM: TRANSABDOMINAL AND TRANSVAGINAL ULTRASOUND OF PELVIS  DOPPLER ULTRASOUND OF OVARIES  TECHNIQUE: Study was performed transabdominally to optimize pelvic field of view evaluation and transvaginally to optimize internal visceral architecture evaluation. Ovarian Doppler ultrasound was also performed  COMPARISON:  Nov 29, 2013  FINDINGS: Uterus  Measurements: 7.4 x 3.7 x 4.9 cm. No fibroids or other mass visualized. Uterus is  anteverted.  Endometrium  Thickness: 7 mm. No focal abnormality visualized. Endometrial contour is smooth.  Right ovary  Measurements: 2.6 x 2.8 x 2.3 cm. Normal appearance/no adnexal mass. There is a somewhat complex small mass in the right ovary measuring 1.5 x 1.3 x 1.9 cm.  Left ovary  Measurements: 3.4 x 2.4 x 2.3 cm. Normal appearance/no adnexal mass.  Pulsed Doppler evaluation of both ovaries demonstrates normal low-resistance arterial and venous waveforms bilaterally. The peak systolic velocity in the right ovary is 11 cm/sec. The peak systolic velocity in the left ovary is 7 cm/sec.  Other findings  No free fluid.  IMPRESSION: Probable hemorrhagic follicle in the right ovary. Short-interval follow up ultrasound in 6-12 weeks is recommended, preferably during the week following the patient's normal menses. No evidence of ovarian torsion on either side. Study otherwise unremarkable. Fallopian tubes are not seen to be dilated in comparison with recent prior study.   Electronically Signed   By: Bretta Bang M.D.   On: 02/08/2014 21:00   [place] Art/ven Flow Abd Pelv  Doppler  02/08/2014   CLINICAL DATA:  Pelvic pain and vaginal bleeding  EXAM: TRANSABDOMINAL AND TRANSVAGINAL ULTRASOUND OF PELVIS  DOPPLER ULTRASOUND OF OVARIES  TECHNIQUE: Study was performed transabdominally to optimize pelvic field of view evaluation and transvaginally to optimize internal visceral architecture evaluation. Ovarian Doppler ultrasound was also performed  COMPARISON:  Nov 29, 2013  FINDINGS: Uterus  Measurements: 7.4 x 3.7 x 4.9 cm. No fibroids or other mass visualized. Uterus is anteverted.  Endometrium  Thickness: 7 mm. No focal abnormality visualized. Endometrial contour is smooth.  Right ovary  Measurements: 2.6 x 2.8 x 2.3 cm. Normal appearance/no adnexal mass. There is a somewhat complex small mass in the right ovary measuring 1.5 x 1.3 x 1.9 cm.  Left ovary  Measurements: 3.4 x 2.4 x 2.3 cm. Normal appearance/no adnexal mass.  Pulsed Doppler evaluation of both ovaries demonstrates normal low-resistance arterial and venous waveforms bilaterally. The peak systolic velocity in the right ovary is 11 cm/sec. The peak systolic velocity in the left ovary is 7 cm/sec.  Other findings  No free fluid.  IMPRESSION: Probable hemorrhagic follicle in the right ovary. Short-interval follow up ultrasound in 6-12 weeks is recommended, preferably during the week following the patient's normal menses. No evidence of ovarian torsion on either side. Study otherwise unremarkable. Fallopian tubes are not seen to be dilated in comparison with recent prior study.   Electronically Signed   By: Bretta Bang M.D.   On: 02/08/2014 21:00     EKG Interpretation None      MDM   Final diagnoses:  Chest pain, unspecified chest pain type  Menorrhagia with irregular cycle  Dysmenorrhea  Pain of lower extremity, unspecified laterality    Medications  sodium chloride 0.9 % bolus 1,000 mL (0 mLs Intravenous Stopped 02/08/14 1854)  morphine 4 MG/ML injection 4 mg (4 mg Intravenous Given 02/08/14 1757)   ondansetron (ZOFRAN) injection 4 mg (4 mg Intravenous Given 02/08/14 1757)  morphine 2 MG/ML injection 2 mg (2 mg Intravenous Given 02/08/14 2037)  ondansetron (ZOFRAN) injection 4 mg (4 mg Intravenous Given 02/08/14 2037)  sodium chloride 0.9 % bolus 500 mL (0 mLs Intravenous Stopped 02/08/14 2136)   Filed Vitals:   02/08/14 1332 02/08/14 1707 02/08/14 1946  BP: 140/105 141/65 141/77  Pulse: 88 66 56  Temp: 99.2 F (37.3 C) 98.1 F (36.7 C)   TempSrc: Oral Oral   Resp: 18 18 14  SpO2: 100% 98% 100%   EKG noted normal sinus rhythm with a heart rate of 90 beats per minute. I-STAT troponin negative elevation. Second troponin negative elevation. D-dimer negative elevation. CBC mildly low white blood cell count of 2.8 with negative left shift or leukocytosis noted. CMP negative findings-BUN 5, creatinine 0.83. Negative elevated AST, ALT, alkaline phosphatase of bilirubin. Glucose 92, anion gap 11.0 mEq liter. Lipase negative elevation. Wet prep negative for trich, clue cells, WBC. Urinalysis negative hemoglobin, nitrites. Trace of leukocytes identified with very mild elevated white blood cell count of 3-6. Urine pregnancy negative. Chest x-ray negative for acute cardiac pulmonary disease. Lumbar spine noted L5 has transitioned features-exaggerated lordosis noted. No definite significant finding. Pelvis US noted probable hemorrhagic follicle the right ovary-recommend ultrasound within the next 6-12 weeks. No evidence of ovarian torsion. Doubt PE. Doubt DVT. Doubt TOA. Doubt CHF. Doubt fluid overload. Doubt cardiac issue based on patient's age and low risk factor - pain is also reproducible upon palpation. Patient presenting to the ED with pelvic pain secondary to menstrual cycle - patient has appointment on February 14, 2014 regarding issues with menstrual cycle. Suspicion of chest pain to be anxiety, reported that the symptoms she presented with today are very similar to her episodes of anxiety that she gets  every once in a while - doubt ACS. Imaging unremarkable. Negative focal neurological deficits noted. IV fluids and pain medications administered in the ED setting, pain controlled in the ED setting. Patient able to tolerate fluids PO. Patient seen and assessed by attending physician, Dr. Raenette Rover who agreed to plan of discharge. Patient stable, afebrile. Patient not septic appearing. Patient discharged. Discussed with patient to rest and stay hydrated. Discussed with patient to keep appointment with OBGYN on 02/14/2014 - recommended repeat US to be performed at this time. Discharged patient with pain medications - discussed course, precautions, disposal technique. Patient reported that she has taken Tramadol in the past without any issues/side effects/reactions. Discussed with patient to closely monitor symptoms and if symptoms are to worsen or change to report back to the ED - strict return instructions given.  Patient agreed to plan of care, understood, all questions answered.   Raymon Mutton, PA-C 02/09/14 1453

## 2014-02-08 NOTE — ED Notes (Addendum)
Called US to get Estimated time.  Spoke to [employer]

## 2014-02-08 NOTE — ED Notes (Signed)
PT states that she has been on her period for 3 wks and has been having low abd cramping that she attributes to her period.  States that she has been having a burning sensation in her legs for 2 wks and started having lt sided chest pain since this morning.  Pt has hx of anxiety and states this feels "similar but different" to what she normally experiences during an anxiety attack.  Hx of long and irregular periods.

## 2014-02-08 NOTE — ED Notes (Signed)
Still waiting for [place]

## 2014-02-08 NOTE — ED Notes (Signed)
Pt ambulated to the BR with steady gait.   

## 2014-02-09 LAB — PATHOLOGIST SMEAR REVIEW

## 2014-02-10 LAB — GC/CHLAMYDIA PROBE AMP
CT Probe RNA: NEGATIVE
GC Probe RNA: NEGATIVE

## 2014-02-12 ENCOUNTER — Inpatient Hospital Stay (HOSPITAL_COMMUNITY)
Admission: AD | Admit: 2014-02-12 | Discharge: 2014-02-12 | Disposition: A | Discharge status: Home / Self Care | Payer: BC Managed Care – PPO | Source: Ambulatory Visit | Attending: Obstetrics & Gynecology | Admitting: Obstetrics & Gynecology

## 2014-02-12 ENCOUNTER — Encounter (HOSPITAL_COMMUNITY): Payer: Self-pay | Admitting: *Deleted

## 2014-02-12 DIAGNOSIS — N939 Abnormal uterine and vaginal bleeding, unspecified: Secondary | ICD-10-CM

## 2014-02-12 DIAGNOSIS — N926 Irregular menstruation, unspecified: Secondary | ICD-10-CM | POA: Insufficient documentation

## 2014-02-12 DIAGNOSIS — N736 Female pelvic peritoneal adhesions (postinfective): Secondary | ICD-10-CM

## 2014-02-12 DIAGNOSIS — R102 Pelvic and perineal pain: Secondary | ICD-10-CM

## 2014-02-12 DIAGNOSIS — R609 Edema, unspecified: Secondary | ICD-10-CM | POA: Insufficient documentation

## 2014-02-12 DIAGNOSIS — G8929 Other chronic pain: Secondary | ICD-10-CM

## 2014-02-12 DIAGNOSIS — R109 Unspecified abdominal pain: Secondary | ICD-10-CM | POA: Insufficient documentation

## 2014-02-12 LAB — CBC
HCT: 35.6 % — ABNORMAL LOW (ref 36.0–46.0)
Hemoglobin: 11.6 g/dL — ABNORMAL LOW (ref 12.0–15.0)
MCH: 27.2 pg (ref 26.0–34.0)
MCHC: 32.6 g/dL (ref 30.0–36.0)
MCV: 83.4 fL (ref 78.0–100.0)
Platelets: 209 10*3/uL (ref 150–400)
RBC: 4.27 MIL/uL (ref 3.87–5.11)
RDW: 14.2 % (ref 11.5–15.5)
WBC: 3.2 10*3/uL — ABNORMAL LOW (ref 4.0–10.5)

## 2014-02-12 LAB — URINALYSIS, ROUTINE W REFLEX MICROSCOPIC
Bilirubin Urine: NEGATIVE
Glucose, UA: NEGATIVE mg/dL
Hgb urine dipstick: NEGATIVE
Ketones, ur: NEGATIVE mg/dL
Leukocytes, UA: NEGATIVE
Nitrite: NEGATIVE
Protein, ur: NEGATIVE mg/dL
Specific Gravity, Urine: 1.025 (ref 1.005–1.030)
Urobilinogen, UA: 1 mg/dL (ref 0.0–1.0)
pH: 6 (ref 5.0–8.0)

## 2014-02-12 LAB — POCT PREGNANCY, URINE: Preg Test, Ur: NEGATIVE

## 2014-02-12 MED ORDER — OXYCODONE-ACETAMINOPHEN 5-325 MG PO TABS: 1.0000 | ORAL_TABLET | ORAL | Status: DC | PRN

## 2014-02-12 MED ORDER — OXYCODONE-ACETAMINOPHEN 5-325 MG PO TABS
2.0000 | ORAL_TABLET | Freq: Once | ORAL | Status: AC
  Administered 2014-02-12: 2 via ORAL
  Filled 2014-02-12: qty 2

## 2014-02-12 MED ORDER — KETOROLAC TROMETHAMINE 60 MG/2ML IM SOLN: 60.0000 mg | Freq: Once | INTRAMUSCULAR | Status: DC

## 2014-02-12 NOTE — MAU Note (Signed)
Assumed care of patient.

## 2014-02-12 NOTE — MAU Note (Signed)
Pt is on BP medication, is taking daily.  Encouraged pt to get a primary doctor that can regulate her meds.

## 2014-02-12 NOTE — MAU Provider Note (Signed)
Chief Complaint: Abdominal Pain   First Provider Initiated Contact with Patient 02/12/14 1906      SUBJECTIVE HPI: Brenda Blankenship is a 28 y.o. [id number] female who presents with chronic LLQ pain and swelling, worse since Lap bilat sterilization w/ Filshie clips 2 months ago. Op report showed adhesions in area of left adnexa and liver. There was a uterine perforation in the lower uterine segment during the procedure. [facility] [place] [facility] past few months for similar symptoms. Has appointment at [facility] hospital outpatient clinic 08/13, but came to maternity admissions because the pain got worse. States she was told that she might have endometriosis and should be tested for that.  Essentially normal postop abdominal CT 3 days after sterilization. Normal abdominal CT one month postop. Transient increased creatinine that has resolved spontaneously since then.  Also concerned about irreg, sometimes prolonged periods. Bleeding moderate, but occasionally very draining and bothersome.  Past Medical History  Diagnosis Date  . Environmental allergies   . Panic attacks   . Preterm labor   . Anemia   . Abnormal cervical Papanicolaou smear     cryo  . Pregnancy induced hypertension    OB History  Gravida Para Term Preterm AB SAB TAB Ectopic Multiple Living  2 2 2  0 0 0 0 0 0 2    # Outcome Date GA Lbr Len/2nd Weight Sex Delivery Anes PTL Lv  2 TRM 08/21/13 [redacted]w[redacted]d 03:08 / 00:02 2.71 kg (5 lb 15.6 oz) F SVD None  Y  1 TRM 07/09/07    M SVD EPI  Y     Past Surgical History  Procedure Laterality Date  . Cryotherapy    . Tubal ligation    . Laparoscopic tubal ligation Bilateral 11/10/2013    Procedure: bilateral LAPAROSCOPIC TUBAL LIGATION with filshie clips;  Surgeon: Freddrick March. Tenny Craw, MD;  Location: [facility];  Service: Gynecology;  Laterality: Bilateral;   History   Social History  . Marital Status: Married    Spouse Name: N/A    Number of  Children: N/A  . Years of Education: N/A   Occupational History  . Not on file.   Social History Main Topics  . Smoking status: Current Every Day Smoker -- 0.25 packs/day for 5 years    Types: Cigarettes  . Smokeless tobacco: Never Used     Comment: stopped with preg  . Alcohol Use: No  . Drug Use: Yes    Special: Marijuana     Comment: stopped with preg  . Sexual Activity: Yes    Birth Control/ Protection: None   Other Topics Concern  . Not on file   Social History Narrative  . No narrative on file   No current facility-administered medications on file prior to encounter.   Current Outpatient Prescriptions on File Prior to Encounter  Medication Sig Dispense Refill  . diclofenac (VOLTAREN) 75 MG EC tablet Take 1 tablet (75 mg total) by mouth 2 (two) times daily.  60 tablet  0  . hydrochlorothiazide (HYDRODIURIL) 25 MG tablet Take 25 mg by mouth daily.      Marland Kitchen ibuprofen (ADVIL,MOTRIN) 200 MG tablet Take 200 mg by mouth every 6 (six) hours as needed for moderate pain.      . Pediatric Multiple Vit-C-FA (FLINSTONES GUMMIES OMEGA-3 DHA) CHEW Chew 2 tablets by mouth daily.      . traMADol (ULTRAM) 50 MG tablet Take 50 mg by mouth  every 6 (six) hours as needed for moderate pain.       Allergies  Allergen Reactions  . Hydrocodone Other (See Comments)    Fainting, lightheadness  . Naprosyn [Naproxen] Rash    ROS: Pertinent items in HPI  OBJECTIVE Blood pressure 143/85, pulse 50, temperature 98.5 F (36.9 C), temperature source Oral, resp. rate 16, height 5\' 2"  (1.575 m), weight 63.504 kg (140 lb), last menstrual period 01/25/2014, SpO2 100.00%, not currently breastfeeding. Patient Vitals for the past 24 hrs:  BP Temp Temp src Pulse Resp SpO2 Height Weight  02/12/14 2100 143/85 mmHg - - 50 16 - - -  02/12/14 1908 130/59 mmHg - - 68 16 - - -  02/12/14 1750 184/109 mmHg 98.5 F (36.9 C) Oral 68 18 100 % 5\' 2"  (1.575 m) 63.504 kg (140 lb)    GENERAL: Well-developed,  well-nourished female in no acute distress.  HEENT: Normocephalic.  HEART: normal rate RESP: normal effort ABDOMEN: Soft, Mild LLQ tenderness. No CVAT. Pos BS x 4.  EXTREMITIES: Nontender, no edema NEURO: Alert and oriented PELVIC EXAM: NEFG, physiologic discharge, no blood noted, cervix closed; uterus normal size, no adnexal tenderness or masses. No CMT.   LAB RESULTS Results for orders placed during the hospital encounter of 02/12/14 (from the past 24 hour(s))  POCT PREGNANCY, URINE     Status: None   Collection Time    02/12/14  6:37 PM      Result Value Ref Range   Preg Test, Ur NEGATIVE  NEGATIVE  URINALYSIS, ROUTINE W REFLEX MICROSCOPIC     Status: None   Collection Time    02/12/14  6:44 PM      Result Value Ref Range   Color, Urine YELLOW  YELLOW   APPearance CLEAR  CLEAR   Specific Gravity, Urine 1.025  1.005 - 1.030   pH 6.0  5.0 - 8.0   Glucose, UA NEGATIVE  NEGATIVE mg/dL   Hgb urine dipstick NEGATIVE  NEGATIVE   Bilirubin Urine NEGATIVE  NEGATIVE   Ketones, ur NEGATIVE  NEGATIVE mg/dL   Protein, ur NEGATIVE  NEGATIVE mg/dL   Urobilinogen, UA 1.0  0.0 - 1.0 mg/dL   Nitrite NEGATIVE  NEGATIVE   Leukocytes, UA NEGATIVE  NEGATIVE  CBC     Status: Abnormal   Collection Time    02/12/14  7:33 PM      Result Value Ref Range   WBC 3.2 (*) 4.0 - 10.5 K/uL   RBC 4.27  3.87 - 5.11 MIL/uL   Hemoglobin 11.6 (*) 12.0 - 15.0 g/dL   HCT 16.1 (*) 09.6 - 04.5 %   MCV 83.4  78.0 - 100.0 fL   MCH 27.2  26.0 - 34.0 pg   MCHC 32.6  30.0 - 36.0 g/dL   RDW 40.9  81.1 - 91.4 %   Platelets 209  150 - 400 K/uL    IMAGING US Transvaginal Non-ob  02/08/2014   CLINICAL DATA:  Pelvic pain and vaginal bleeding  EXAM: TRANSABDOMINAL AND TRANSVAGINAL ULTRASOUND OF PELVIS  DOPPLER ULTRASOUND OF OVARIES  TECHNIQUE: Study was performed transabdominally to optimize pelvic field of view evaluation and transvaginally to optimize internal visceral architecture evaluation. Ovarian Doppler  ultrasound was also performed  COMPARISON:  Nov 29, 2013  FINDINGS: Uterus  Measurements: 7.4 x 3.7 x 4.9 cm. No fibroids or other mass visualized. Uterus is anteverted.  Endometrium  Thickness: 7 mm. No focal abnormality visualized. Endometrial contour is smooth.  Right ovary  Measurements: 2.6 x 2.8 x 2.3 cm. Normal appearance/no adnexal mass. There is a somewhat complex small mass in the right ovary measuring 1.5 x 1.3 x 1.9 cm.  Left ovary  Measurements: 3.4 x 2.4 x 2.3 cm. Normal appearance/no adnexal mass.  Pulsed Doppler evaluation of both ovaries demonstrates normal low-resistance arterial and venous waveforms bilaterally. The peak systolic velocity in the right ovary is 11 cm/sec. The peak systolic velocity in the left ovary is 7 cm/sec.  Other findings  No free fluid.  IMPRESSION: Probable hemorrhagic follicle in the right ovary. Short-interval follow up ultrasound in 6-12 weeks is recommended, preferably during the week following the patient's normal menses. No evidence of ovarian torsion on either side. Study otherwise unremarkable. Fallopian tubes are not seen to be dilated in comparison with recent prior study.   Electronically Signed   By: Bretta BangWilliam  Woodruff M.D.   On: 02/08/2014 21:00   [place] Pelvis Complete  02/08/2014   CLINICAL DATA:  Pelvic pain and vaginal bleeding  EXAM: TRANSABDOMINAL AND TRANSVAGINAL ULTRASOUND OF PELVIS  DOPPLER ULTRASOUND OF OVARIES  TECHNIQUE: Study was performed transabdominally to optimize pelvic field of view evaluation and transvaginally to optimize internal visceral architecture evaluation. Ovarian Doppler ultrasound was also performed  COMPARISON:  Nov 29, 2013  FINDINGS: Uterus  Measurements: 7.4 x 3.7 x 4.9 cm. No fibroids or other mass visualized. Uterus is anteverted.  Endometrium  Thickness: 7 mm. No focal abnormality visualized. Endometrial contour is smooth.  Right ovary  Measurements: 2.6 x 2.8 x 2.3 cm. Normal appearance/no adnexal mass. There is a somewhat  complex small mass in the right ovary measuring 1.5 x 1.3 x 1.9 cm.  Left ovary  Measurements: 3.4 x 2.4 x 2.3 cm. Normal appearance/no adnexal mass.  Pulsed Doppler evaluation of both ovaries demonstrates normal low-resistance arterial and venous waveforms bilaterally. The peak systolic velocity in the right ovary is 11 cm/sec. The peak systolic velocity in the left ovary is 7 cm/sec.  Other findings  No free fluid.  IMPRESSION: Probable hemorrhagic follicle in the right ovary. Short-interval follow up ultrasound in 6-12 weeks is recommended, preferably during the week following the patient's normal menses. No evidence of ovarian torsion on either side. Study otherwise unremarkable. Fallopian tubes are not seen to be dilated in comparison with recent prior study.   Electronically Signed   By: Bretta BangWilliam  Woodruff M.D.   On: 02/08/2014 21:00   [place] Art/ven Flow Abd Pelv Doppler  02/08/2014   CLINICAL DATA:  Pelvic pain and vaginal bleeding  EXAM: TRANSABDOMINAL AND TRANSVAGINAL ULTRASOUND OF PELVIS  DOPPLER ULTRASOUND OF OVARIES  TECHNIQUE: Study was performed transabdominally to optimize pelvic field of view evaluation and transvaginally to optimize internal visceral architecture evaluation. Ovarian Doppler ultrasound was also performed  COMPARISON:  Nov 29, 2013  FINDINGS: Uterus  Measurements: 7.4 x 3.7 x 4.9 cm. No fibroids or other mass visualized. Uterus is anteverted.  Endometrium  Thickness: 7 mm. No focal abnormality visualized. Endometrial contour is smooth.  Right ovary  Measurements: 2.6 x 2.8 x 2.3 cm. Normal appearance/no adnexal mass. There is a somewhat complex small mass in the right ovary measuring 1.5 x 1.3 x 1.9 cm.  Left ovary  Measurements: 3.4 x 2.4 x 2.3 cm. Normal appearance/no adnexal mass.  Pulsed Doppler evaluation of both ovaries demonstrates normal low-resistance arterial and venous waveforms bilaterally. The peak systolic velocity in the right ovary is 11 cm/sec. The peak systolic  velocity in the left ovary is 7 cm/sec.  Other findings  No free fluid.  IMPRESSION: Probable hemorrhagic follicle in the right ovary. Short-interval follow up ultrasound in 6-12 weeks is recommended, preferably during the week following the patient's normal menses. No evidence of ovarian torsion on either side. Study otherwise unremarkable. Fallopian tubes are not seen to be dilated in comparison with recent prior study.   Electronically Signed   By: Bretta Bang M.D.   On: 02/08/2014 21:00    MAU COURSE Lengthy conversation w/ pt about adhesion as likely cause for pain and no need for additional imaging at this time. May need referral to pelvic pain clinic. Will need to discuss menstrual irregularities in clinic setting. Brief discussion of options. States OCPs don't help.   ASSESSMENT 1. Pelvic adhesions   2. Chronic pelvic pain in female   3. Abnormal uterine bleeding (AUB)     PLAN Discharge home in stable condition. Comfort measures.      Follow-up Information   Follow up with [facility]. (As scheduled)    Contact information:   [address] [place] [address] [phone]       Follow up with [facility] [place] [facility]. (As needed in emergencies)    Contact information:   [address] [id number] [place] [address] [phone]       Medication List         diclofenac 75 MG EC tablet  Commonly known as:  VOLTAREN  Take 1 tablet (75 mg total) by mouth 2 (two) times daily.     FLINSTONES GUMMIES OMEGA-3 DHA Chew  Chew 2 tablets by mouth daily.     hydrochlorothiazide 25 MG tablet  Commonly known as:  HYDRODIURIL  Take 25 mg by mouth daily.     ibuprofen 200 MG tablet  Commonly known as:  ADVIL,MOTRIN  Take 200 mg by mouth every 6 (six) hours as needed for moderate pain.     oxyCODONE-acetaminophen 5-325 MG per tablet  Commonly known as:  PERCOCET/ROXICET  Take 1-2 tablets by mouth every 4 (four)  hours as needed.     traMADol 50 MG tablet  Commonly known as:  ULTRAM  Take 50 mg by mouth every 6 (six) hours as needed for moderate pain.         [place], [place] 02/12/2014  9:18 PM

## 2014-02-12 NOTE — MAU Note (Signed)
appt at clinic 08/13. [facility].  Cramping in lower abd. Swelling in lower abd, legs and feet.  Cycle is still extremely irregular.  [facility] might have endometriosis and should have testing/ procedure for that.

## 2014-02-12 NOTE — ED Provider Notes (Signed)
Medical screening examination/treatment/procedure(s) were conducted as a shared visit with non-physician practitioner(s) and myself.  I personally evaluated the patient during the encounter.   EKG Interpretation   Date/Time:  Thursday February 08 2014 13:30:08 EDT Ventricular Rate:  90 PR Interval:  137 QRS Duration: 64 QT Interval:  366 QTC Calculation: 448 R Axis:   67 Text Interpretation:  Sinus rhythm Baseline wander in lead(s) I III aVL V3  ED PHYSICIAN INTERPRETATION AVAILABLE IN [facility] Confirmed by  TEST, Record ([id number]) on 02/10/2014 12:17:39 PM      Patient here with multiple complaints. Workup largely unremarkable. Stable for discharge.   Dagmar HaitWilliam Norwin Aleman, MD 02/12/14 [phone]

## 2014-02-12 NOTE — Discharge Instructions (Signed)
Adhesions Adhesions are stringy (fibrous) bands of tissue. Adhesions are similar to scars, but they are on the inside of your body. Adhesions form between two surfaces of the body. CAUSES   The most common adhesions are those that occur following surgery. Touching and moving things within the belly (abdomen) leads to inflammation and adhesions can form. Not all people get adhesions following surgery. But, adhesions may happen even with the gentlest handling of the organs in the abdomen. There is no way to predict who will have adhesions. The adhesions can occur between:  Loops of bowel.  The bowel and other organs such as the liver.  The contents in the pelvis such as the uterus, bladder, ovaries and tubes.  Inflammation within the abdomen even if there is no surgery. An example would be an infection in the tubes and uterus (pelvic inflammatory disease). Any infection will cause inflammation that may lead to adhesions.  Radiation treatment. SYMPTOMS  Many people have no signs or symptoms from adhesions. However, adhesions may cause a number of symptoms. Some of these are:  Abdominal pain, tenderness or cramping.  Abdominal bloating.  Constipation or diarrhea.  Vomiting.  Painful sex.  In females, difficulty getting pregnant. DIAGNOSIS   An exam by your caregiver may suggest that adhesions are present.  A surgical procedure using a small incision and a thin scope can be used to look inside the abdomen at the adhesions.  Sometimes x-rays may suggest adhesions inside the abdomen. TREATMENT  Surgery can be performed to separate the adhesions. This often takes care of the problems caused by the adhesions, although surgery may also cause more adhesions to develop. PROGNOSIS   The outcome is usually good if an operation is used to fix adhesions inside the belly.  All adhesions may reoccur and the problem can come back. HOME CARE INSTRUCTIONS   Take usual medications as directed  by your caregiver.  Only take over-the-counter or prescription medicines for pain, discomfort or fever as directed by your caregiver.  Pay attention to the pain:  Has it changed?  Has it moved?  Is it gone?  Do not eat solid food until your pain is gone.  While you have pain: Stay on a clear liquid diet. A clear liquid is one you can see through (water, weak tea, broth or bouillon, lemon lime carbonated drinks, gelatin, popsicles or ice chips).  When your pain is gone: Start a light diet (dry toast, crackers, applesauce, white rice, bananas, broth or bouillon). Increase the diet slowly as long as it does not bother you. No dairy products (including cheese and eggs) and no spicy, fatty, fried or high fiber foods. Your caregiver will tell you if you should be on a special diet.  No alcohol, caffeine or cigarettes.  If your caregiver has given you a follow-up appointment, it is very important to keep that appointment. Not keeping the appointment could result in a permanent injury or lasting (chronic) pain or disability. SEEK IMMEDIATE MEDICAL CARE IF:   Your pain is not gone in 24 hours.  Your pain becomes worse, changes location or feels different.  You have a fever.  Your vomiting will not stop.  You have blood or brown flecks (like coffee grounds) in your vomit.  You have blood in your bowel movements.  Your bowel movements are dark or black.  Your bowel movements stop (become blocked) or you can not pass gas. Document Released: 10/03/2003 Document Revised: 10/05/2011 Document Reviewed: 05/03/2009 ExitCare Patient Information  2015 [employer]. This information is not intended to replace advice given to you by your health care provider. Make sure you discuss any questions you have with your health care provider.   Abnormal Uterine Bleeding Abnormal uterine bleeding can affect women at various stages in life, including teenagers, women in their reproductive years,  pregnant women, and women who have reached menopause. Several kinds of uterine bleeding are considered abnormal, including:  Bleeding or spotting between periods.   Bleeding after sexual intercourse.   Bleeding that is heavier or more than normal.   Periods that last longer than usual.  Bleeding after menopause.  Many cases of abnormal uterine bleeding are minor and simple to treat, while others are more serious. Any type of abnormal bleeding should be evaluated by your health care provider. Treatment will depend on the cause of the bleeding. HOME CARE INSTRUCTIONS Monitor your condition for any changes. The following actions may help to alleviate any discomfort you are experiencing:  Avoid the use of tampons and douches as directed by your health care provider.  Change your pads frequently. You should get regular pelvic exams and Pap tests. Keep all follow-up appointments for diagnostic tests as directed by your health care provider.  SEEK MEDICAL CARE IF:   Your bleeding lasts more than 1 week.   You feel dizzy at times.  SEEK IMMEDIATE MEDICAL CARE IF:   You pass out.   You are changing pads every 15 to 30 minutes.   You have abdominal pain.  You have a fever.   You become sweaty or weak.   You are passing large blood clots from the vagina.   You start to feel nauseous and vomit. MAKE SURE YOU:   Understand these instructions.  Will watch your condition.  Will get help right away if you are not doing well or get worse. Document Released: 07/13/2005 Document Revised: 07/18/2013 Document Reviewed: 02/09/2013 [facility] Patient Information 2015 [place], [place]. This information is not intended to replace advice given to you by your health care provider. Make sure you discuss any questions you have with your health care provider.

## 2014-02-15 NOTE — MAU Provider Note (Signed)
Attestation of Attending Supervision of Advanced Practitioner (CNM/NP): Evaluation and management procedures were performed by the Advanced Practitioner under my supervision and collaboration.  I have reviewed the Advanced Practitioner's note and chart, and I agree with the management and plan.  HARRAWAY-SMITH, Reynaldo Rossman 4:36 PM

## 2014-02-28 ENCOUNTER — Encounter (HOSPITAL_COMMUNITY): Payer: Self-pay

## 2014-02-28 ENCOUNTER — Inpatient Hospital Stay (HOSPITAL_COMMUNITY)
Admission: AD | Admit: 2014-02-28 | Discharge: 2014-02-28 | Disposition: A | Discharge status: Home / Self Care | Payer: BC Managed Care – PPO | Source: Ambulatory Visit | Attending: Obstetrics & Gynecology | Admitting: Obstetrics & Gynecology

## 2014-02-28 DIAGNOSIS — N925 Other specified irregular menstruation: Secondary | ICD-10-CM | POA: Insufficient documentation

## 2014-02-28 DIAGNOSIS — F17200 Nicotine dependence, unspecified, uncomplicated: Secondary | ICD-10-CM | POA: Insufficient documentation

## 2014-02-28 DIAGNOSIS — N949 Unspecified condition associated with female genital organs and menstrual cycle: Secondary | ICD-10-CM | POA: Insufficient documentation

## 2014-02-28 DIAGNOSIS — N938 Other specified abnormal uterine and vaginal bleeding: Secondary | ICD-10-CM | POA: Diagnosis not present

## 2014-02-28 DIAGNOSIS — N926 Irregular menstruation, unspecified: Secondary | ICD-10-CM | POA: Insufficient documentation

## 2014-02-28 LAB — CBC
HCT: 35.9 % — ABNORMAL LOW (ref 36.0–46.0)
Hemoglobin: 12.1 g/dL (ref 12.0–15.0)
MCH: 27.9 pg (ref 26.0–34.0)
MCHC: 33.7 g/dL (ref 30.0–36.0)
MCV: 82.9 fL (ref 78.0–100.0)
Platelets: 172 10*3/uL (ref 150–400)
RBC: 4.33 MIL/uL (ref 3.87–5.11)
RDW: 13.4 % (ref 11.5–15.5)
WBC: 3.1 10*3/uL — ABNORMAL LOW (ref 4.0–10.5)

## 2014-02-28 MED ORDER — KETOROLAC TROMETHAMINE 60 MG/2ML IM SOLN
60.0000 mg | Freq: Once | INTRAMUSCULAR | Status: AC
  Administered 2014-02-28: 60 mg via INTRAMUSCULAR
  Filled 2014-02-28: qty 2

## 2014-02-28 MED ORDER — MEDROXYPROGESTERONE ACETATE 10 MG PO TABS: 10.0000 mg | ORAL_TABLET | Freq: Every day | ORAL | Status: DC

## 2014-02-28 MED ORDER — TRAMADOL HCL 50 MG PO TABS: 50.0000 mg | ORAL_TABLET | Freq: Four times a day (QID) | ORAL | Status: DC | PRN

## 2014-02-28 NOTE — MAU Note (Signed)
Pt reports she is having increased vaginal bleeding that started last night. Pt has had irregular periods for sevral months. Has appointment with clinic on 8/13 but bleeding got relly heavy last night and today called and clinic told her to come to MAU for eval.

## 2014-02-28 NOTE — MAU Provider Note (Signed)
History     CSN: [id number]  Arrival date and time: 02/28/14 1322   First Provider Initiated Contact with Patient 02/28/14 1456      Chief Complaint  Patient presents with  . Vaginal Bleeding   HPI Comments: Brenda Blankenship 28 y.o. [id number] presents to MAU with excessive vaginal bleeding. She has been having an irregular menses for months to years. She was given BCP tapers x 2 in past at [facility] that were not helpful. This was before BTL. In the last few days she has had blood running down her legs and this is interfering with work and home life. She also complains that the menses is painful in low back. Last H/H from 7/20 was 11.6 and 35.6. She has an appointment in [facility] on 03/08/14  Vaginal Bleeding Associated symptoms include back pain.      Past Medical History  Diagnosis Date  . Environmental allergies   . Panic attacks   . Preterm labor   . Anemia   . Abnormal cervical Papanicolaou smear     cryo  . Pregnancy induced hypertension     Past Surgical History  Procedure Laterality Date  . Cryotherapy    . Tubal ligation    . Laparoscopic tubal ligation Bilateral 11/10/2013    Procedure: bilateral LAPAROSCOPIC TUBAL LIGATION with filshie clips;  Surgeon: Freddrick March. Tenny Craw, MD;  Location: [facility];  Service: Gynecology;  Laterality: Bilateral;    Family History  Problem Relation Age of Onset  . Hypertension Mother   . Hypertension Father   . Hyperlipidemia Father   . Diabetes Maternal Grandmother   . Cancer Paternal Grandmother     started in lung  . Hypertension Paternal Grandmother     History  Substance Use Topics  . Smoking status: Current Every Day Smoker -- 0.25 packs/day for 5 years    Types: Cigarettes  . Smokeless tobacco: Never Used     Comment: stopped with preg  . Alcohol Use: No    Allergies:  Allergies  Allergen Reactions  . Hydrocodone Other (See Comments)    Pt states that this medication causes her to pass out.   . Naprosyn  [Naproxen] Itching and Rash    Prescriptions prior to admission  Medication Sig Dispense Refill  . hydrochlorothiazide (HYDRODIURIL) 25 MG tablet Take 25 mg by mouth daily.      Marland Kitchen ibuprofen (ADVIL,MOTRIN) 600 MG tablet Take 600 mg by mouth every 6 (six) hours as needed for moderate pain.      . Pediatric Multiple Vit-C-FA (FLINSTONES GUMMIES OMEGA-3 DHA) CHEW Chew 2 tablets by mouth daily.      . traMADol (ULTRAM) 50 MG tablet Take 50 mg by mouth every 6 (six) hours as needed for moderate pain.        Review of Systems  Constitutional: Negative.   HENT: Negative.   Respiratory: Negative.   Cardiovascular: Negative.   Gastrointestinal: Negative.   Genitourinary: Positive for vaginal bleeding.       Heavy vaginal bleeding  Musculoskeletal: Positive for back pain.  Skin: Negative.   Neurological: Negative.   Psychiatric/Behavioral: Negative.    Physical Exam   Blood pressure 140/100, pulse 59, temperature 98.7 F (37.1 C), temperature source Oral, resp. rate 18, height 5\' 2"  (1.575 m), weight 62.506 kg (137 lb 12.8 oz), last menstrual period 02/27/2014, not currently breastfeeding.  Physical Exam  Constitutional: She is oriented to person, place, and time. She appears well-developed and well-nourished. No distress.  HENT:  Head: Normocephalic and atraumatic.  Eyes: Pupils are equal, round, and reactive to light.  GI: Soft. Bowel sounds are normal. She exhibits no distension. There is no tenderness. There is no rebound and no guarding.  Genitourinary:  Genital:External negative Vaginal:moderate amount blood in vault/ 2 fox swabs to clear Cervix:closed/ thick/ no CMT Bimanual:nontender uterus   Musculoskeletal: Normal range of motion.  Neurological: She is alert and oriented to person, place, and time.  Skin: Skin is warm and dry.  Psychiatric: She has a normal mood and affect. Her behavior is normal. Judgment and thought content normal.   Results for orders placed during the  hospital encounter of 02/28/14 (from the past 24 hour(s))  CBC     Status: Abnormal   Collection Time    02/28/14  3:02 PM      Result Value Ref Range   WBC 3.1 (*) 4.0 - 10.5 K/uL   RBC 4.33  3.87 - 5.11 MIL/uL   Hemoglobin 12.1  12.0 - 15.0 g/dL   HCT 40.935.9 (*) 81.136.0 - 91.446.0 %   MCV 82.9  78.0 - 100.0 fL   MCH 27.9  26.0 - 34.0 pg   MCHC 33.7  30.0 - 36.0 g/dL   RDW 78.213.4  95.611.5 - 21.315.5 %   Platelets 172  150 - 400 K/uL    MAU Course  Procedures  MDM Toradol 60 mg IM for pain  Assessment and Plan   A: DUB  P: Provera 10mg  x 10 days Ultram 50 mg po q6 hours Keep appointment with Clinic  Carolynn ServeBarefoot, Yenni Carra Miller 02/28/2014, 3:52 PM

## 2014-02-28 NOTE — MAU Provider Note (Signed)
Attestation of Attending Supervision of Advanced Practitioner (CNM/NP): Evaluation and management procedures were performed by the Advanced Practitioner under my supervision and collaboration.  I have reviewed the Advanced Practitioner's note and chart, and I agree with the management and plan.  HARRAWAY-SMITH, Twylah Bennetts 4:30 PM     

## 2014-02-28 NOTE — Discharge Instructions (Signed)
Abnormal Uterine Bleeding Abnormal uterine bleeding can affect women at various stages in life, including teenagers, women in their reproductive years, pregnant women, and women who have reached menopause. Several kinds of uterine bleeding are considered abnormal, including:  Bleeding or spotting between periods.   Bleeding after sexual intercourse.   Bleeding that is heavier or more than normal.   Periods that last longer than usual.  Bleeding after menopause.  Many cases of abnormal uterine bleeding are minor and simple to treat, while others are more serious. Any type of abnormal bleeding should be evaluated by your health care provider. Treatment will depend on the cause of the bleeding. HOME CARE INSTRUCTIONS Monitor your condition for any changes. The following actions may help to alleviate any discomfort you are experiencing:  Avoid the use of tampons and douches as directed by your health care provider.  Change your pads frequently. You should get regular pelvic exams and Pap tests. Keep all follow-up appointments for diagnostic tests as directed by your health care provider.  SEEK MEDICAL CARE IF:   Your bleeding lasts more than 1 week.   You feel dizzy at times.  SEEK IMMEDIATE MEDICAL CARE IF:   You pass out.   You are changing pads every 15 to 30 minutes.   You have abdominal pain.  You have a fever.   You become sweaty or weak.   You are passing large blood clots from the vagina.   You start to feel nauseous and vomit. MAKE SURE YOU:   Understand these instructions.  Will watch your condition.  Will get help right away if you are not doing well or get worse. Document Released: 07/13/2005 Document Revised: 07/18/2013 Document Reviewed: 02/09/2013 ExitCare Patient Information 2015 [employer]. This information is not intended to replace advice given to you by your health care provider. Make sure you discuss any questions you have with your  health care provider.  

## 2014-03-08 ENCOUNTER — Encounter: Payer: Self-pay | Admitting: Obstetrics & Gynecology

## 2014-03-08 ENCOUNTER — Ambulatory Visit (INDEPENDENT_AMBULATORY_CARE_PROVIDER_SITE_OTHER): Payer: BC Managed Care – PPO | Admitting: Obstetrics & Gynecology

## 2014-03-08 VITALS — BP 152/90 | HR 76 | Temp 98.3°F | Ht 63.0 in | Wt 133.0 lb

## 2014-03-08 DIAGNOSIS — N938 Other specified abnormal uterine and vaginal bleeding: Secondary | ICD-10-CM | POA: Insufficient documentation

## 2014-03-08 DIAGNOSIS — N949 Unspecified condition associated with female genital organs and menstrual cycle: Secondary | ICD-10-CM | POA: Diagnosis not present

## 2014-03-08 DIAGNOSIS — N946 Dysmenorrhea, unspecified: Secondary | ICD-10-CM | POA: Insufficient documentation

## 2014-03-08 DIAGNOSIS — N925 Other specified irregular menstruation: Secondary | ICD-10-CM

## 2014-03-08 MED ORDER — MEDROXYPROGESTERONE ACETATE 10 MG PO TABS: 10.0000 mg | ORAL_TABLET | Freq: Every day | ORAL | Status: DC

## 2014-03-08 NOTE — Progress Notes (Signed)
States forgot blood pressure medicine today.

## 2014-03-08 NOTE — Patient Instructions (Signed)
Hysterectomy Information  A hysterectomy is a surgery in which your uterus is removed. This surgery may be done to treat various medical problems. After the surgery, you will no longer have menstrual periods. The surgery will also make you unable to become pregnant (sterile). The fallopian tubes and ovaries can be removed (bilateral salpingo-oophorectomy) during this surgery as well.  REASONS FOR A HYSTERECTOMY  Persistent, abnormal bleeding.  Lasting (chronic) pelvic pain or infection.  The lining of the uterus (endometrium) starts growing outside the uterus (endometriosis).  The endometrium starts growing in the muscle of the uterus (adenomyosis).  The uterus falls down into the vagina (pelvic organ prolapse).  Noncancerous growths in the uterus (uterine fibroids) that cause symptoms.  Precancerous cells.  Cervical cancer or uterine cancer. TYPES OF HYSTERECTOMIES  Supracervical hysterectomy--In this type, the top part of the uterus is removed, but not the cervix.  Total hysterectomy--The uterus and cervix are removed.  Radical hysterectomy--The uterus, the cervix, and the fibrous tissue that holds the uterus in place in the pelvis (parametrium) are removed. WAYS A HYSTERECTOMY CAN BE PERFORMED  Abdominal hysterectomy--A large surgical cut (incision) is made in the abdomen. The uterus is removed through this incision.  Vaginal hysterectomy--An incision is made in the vagina. The uterus is removed through this incision. There are no abdominal incisions.  Conventional laparoscopic hysterectomy--Three or four small incisions are made in the abdomen. A thin, lighted tube with a camera (laparoscope) is inserted into one of the incisions. Other tools are put through the other incisions. The uterus is cut into small pieces. The small pieces are removed through the incisions, or they are removed through the vagina.  Laparoscopically assisted vaginal hysterectomy (LAVH)--Three or four  small incisions are made in the abdomen. Part of the surgery is performed laparoscopically and part vaginally. The uterus is removed through the vagina.  Robot-assisted laparoscopic hysterectomy--A laparoscope and other tools are inserted into 3 or 4 small incisions in the abdomen. A computer-controlled device is used to give the surgeon a 3D image and to help control the surgical instruments. This allows for more precise movements of surgical instruments. The uterus is cut into small pieces and removed through the incisions or removed through the vagina. RISKS AND COMPLICATIONS  Possible complications associated with this procedure include:  Bleeding and risk of blood transfusion. Tell your health care provider if you do not want to receive any blood products.  Blood clots in the legs or lung.  Infection.  Injury to surrounding organs.  Problems or side effects related to anesthesia.  Conversion to an abdominal hysterectomy from one of the other techniques. WHAT TO EXPECT AFTER A HYSTERECTOMY  You will be given pain medicine.  You will need to have someone with you for the first 3-5 days after you go home.  You will need to follow up with your surgeon in 2-4 weeks after surgery to evaluate your progress.  You may have early menopause symptoms such as hot flashes, night sweats, and insomnia.  If you had a hysterectomy for a problem that was not cancer or not a condition that could lead to cancer, then you no longer need Pap tests. However, even if you no longer need a Pap test, a regular exam is a good idea to make sure no other problems are starting. Document Released: 01/06/2001 Document Revised: 05/03/2013 Document Reviewed: 03/20/2013 ExitCare Patient Information 2015 [employer]. This information is not intended to replace advice given to you by your health care   provider. Make sure you discuss any questions you have with your health care provider.  

## 2014-03-08 NOTE — Progress Notes (Signed)
Patient ID: Brenda Blankenship, female   DOB: 10-15-1985, 28 y.o.   MRN: [id number]  Chief Complaint  Patient presents with  . DUB    HPI Brenda Blankenship is a 28 y.o. female.  [id number] Patient's last menstrual period was 02/27/2014. DUB and pain despite provera tx, [facility]. Has tried OCP, DMPA, now on provera and wants definitive therapy  HPI  Past Medical History  Diagnosis Date  . Environmental allergies   . Panic attacks   . Preterm labor   . Anemia   . Abnormal cervical Papanicolaou smear     cryo  . Pregnancy induced hypertension   . DUB (dysfunctional uterine bleeding)     Past Surgical History  Procedure Laterality Date  . Cryotherapy    . Tubal ligation    . Laparoscopic tubal ligation Bilateral 11/10/2013    Procedure: bilateral LAPAROSCOPIC TUBAL LIGATION with filshie clips;  Surgeon: Freddrick MarchKendra H. Tenny Crawoss, MD;  Location: [facility];  Service: Gynecology;  Laterality: Bilateral;    Family History  Problem Relation Age of Onset  . Hypertension Mother   . Hypertension Father   . Hyperlipidemia Father   . Diabetes Maternal Grandmother   . Cancer Paternal Grandmother     started in lung  . Hypertension Paternal Grandmother     Social History History  Substance Use Topics  . Smoking status: Current Every Day Smoker -- 0.25 packs/day for 5 years    Types: Cigarettes  . Smokeless tobacco: Never Used     Comment: stopped with preg  . Alcohol Use: No    Allergies  Allergen Reactions  . Hydrocodone Other (See Comments)    Pt states that this medication causes her to pass out.   . Naprosyn [Naproxen] Itching and Rash    Current Outpatient Prescriptions  Medication Sig Dispense Refill  . hydrochlorothiazide (HYDRODIURIL) 25 MG tablet Take 25 mg by mouth daily.      . medroxyPROGESTERone (PROVERA) 10 MG tablet Take 1 tablet (10 mg total) by mouth daily.  10 tablet  0  . Pediatric Multiple Vit-C-FA (FLINSTONES GUMMIES OMEGA-3 DHA) CHEW Chew 2 tablets by  mouth daily.      . traMADol (ULTRAM) 50 MG tablet Take 1 tablet (50 mg total) by mouth every 6 (six) hours as needed for moderate pain.  30 tablet  0  . ibuprofen (ADVIL,MOTRIN) 600 MG tablet Take 600 mg by mouth every 6 (six) hours as needed for moderate pain.       No current facility-administered medications for this visit.    Review of Systems Review of Systems  Constitutional: Negative.   Genitourinary: Positive for menstrual problem and pelvic pain. Negative for dysuria, urgency, vaginal bleeding (none today) and difficulty urinating.  Skin: Negative for pallor.    Blood pressure 152/90, pulse 76, temperature 98.3 F (36.8 C), height 5\' 3"  (1.6 m), weight 133 lb (60.328 kg), last menstrual period 02/27/2014, not currently breastfeeding.  Physical Exam Physical Exam  Constitutional: She is oriented to person, place, and time. She appears well-developed. No distress.  Pulmonary/Chest: Effort normal. No respiratory distress.  Abdominal: Soft. She exhibits no distension.  Genitourinary: Vagina normal and uterus normal. No vaginal discharge found.  Mild tenderness left side no mass  Neurological: She is alert and oriented to person, place, and time.  Skin: Skin is warm and dry.  Psychiatric: She has a normal mood and affect. Her behavior is normal.    Data Reviewed Op notes  CBC    Component Value Date/Time   WBC 3.1* 02/28/2014 1502   RBC 4.33 02/28/2014 1502   HGB 12.1 02/28/2014 1502   HCT 35.9* 02/28/2014 1502   PLT 172 02/28/2014 1502   MCV 82.9 02/28/2014 1502   MCH 27.9 02/28/2014 1502   MCHC 33.7 02/28/2014 1502   RDW 13.4 02/28/2014 1502   LYMPHSABS 1.6 02/08/2014 1343   MONOABS 0.4 02/08/2014 1343   EOSABS 0.1 02/08/2014 1343   BASOSABS 0.0 02/08/2014 1343      Assessment    DUB unresponsive to medical management, patient is sterilized     Plan    Offered TVH which she wants to schedule. Need [facility] from [facility] and [facility]        Brenda Blankenship 03/08/2014, 3:28  PM

## 2014-03-09 ENCOUNTER — Inpatient Hospital Stay (HOSPITAL_COMMUNITY)
Admission: AD | Admit: 2014-03-09 | Discharge: 2014-03-09 | Disposition: A | Discharge status: Home / Self Care | Payer: BC Managed Care – PPO | Source: Ambulatory Visit | Attending: Obstetrics and Gynecology | Admitting: Obstetrics and Gynecology

## 2014-03-09 ENCOUNTER — Encounter (HOSPITAL_COMMUNITY): Payer: Self-pay | Admitting: *Deleted

## 2014-03-09 DIAGNOSIS — N938 Other specified abnormal uterine and vaginal bleeding: Secondary | ICD-10-CM | POA: Diagnosis not present

## 2014-03-09 DIAGNOSIS — N949 Unspecified condition associated with female genital organs and menstrual cycle: Secondary | ICD-10-CM | POA: Diagnosis not present

## 2014-03-09 DIAGNOSIS — Z885 Allergy status to narcotic agent status: Secondary | ICD-10-CM | POA: Diagnosis not present

## 2014-03-09 DIAGNOSIS — N925 Other specified irregular menstruation: Secondary | ICD-10-CM | POA: Insufficient documentation

## 2014-03-09 DIAGNOSIS — F17200 Nicotine dependence, unspecified, uncomplicated: Secondary | ICD-10-CM | POA: Diagnosis not present

## 2014-03-09 LAB — URINALYSIS, ROUTINE W REFLEX MICROSCOPIC
Bilirubin Urine: NEGATIVE
Glucose, UA: NEGATIVE mg/dL
Ketones, ur: NEGATIVE mg/dL
Leukocytes, UA: NEGATIVE
Nitrite: NEGATIVE
Protein, ur: NEGATIVE mg/dL
Specific Gravity, Urine: 1.02 (ref 1.005–1.030)
Urobilinogen, UA: 2 mg/dL — ABNORMAL HIGH (ref 0.0–1.0)
pH: 6 (ref 5.0–8.0)

## 2014-03-09 LAB — POCT PREGNANCY, URINE: Preg Test, Ur: NEGATIVE

## 2014-03-09 LAB — URINE MICROSCOPIC-ADD ON

## 2014-03-09 MED ORDER — MEGESTROL ACETATE 40 MG PO TABS: 40.0000 mg | ORAL_TABLET | Freq: Three times a day (TID) | ORAL | Status: DC

## 2014-03-09 MED ORDER — OXYCODONE-ACETAMINOPHEN 5-325 MG PO TABS
1.0000 | ORAL_TABLET | Freq: Once | ORAL | Status: AC
Start: 2014-03-09 — End: 2014-03-09
  Administered 2014-03-09: 1 via ORAL
  Filled 2014-03-09: qty 1

## 2014-03-09 MED ORDER — PROMETHAZINE HCL 25 MG PO TABS
25.0000 mg | ORAL_TABLET | Freq: Once | ORAL | Status: AC
  Administered 2014-03-09: 25 mg via ORAL
  Filled 2014-03-09: qty 1

## 2014-03-09 NOTE — MAU Provider Note (Signed)
Attestation of Attending Supervision of Advanced Practitioner: Evaluation and management procedures were performed by the PA/NP/CNM/OB Fellow under my supervision/collaboration. Chart reviewed and agree with management and plan.  Brenda Blankenship V 03/09/2014 10:39 PM   

## 2014-03-09 NOTE — MAU Note (Addendum)
Was having horrible, heavy bleeding- was put on medication to stop bleeding. Stopped for 8 days and has now started again, very heavy. Called office and was told to come here. (office closed) . A little light headed

## 2014-03-09 NOTE — MAU Provider Note (Signed)
History     CSN: [id number]  Arrival date and time: 03/09/14 1637   First Provider Initiated Contact with Patient 03/09/14 1845      Chief Complaint  Patient presents with  . Vaginal Bleeding   HPI Comments: Brenda Blankenship 28 y.o. [id number]  Presents to MAU with vaginal bleeding. There have been multiple visits for same. She saw Dr Debroah LoopArnold in Clinic yesterday and she was offered Vaginal Hysterectomy. She is willing to do this but it can not be scheduled for one month. In the meantime she would like to not bleed and have better pain control. She says usually the Ultram works well but she let the pain get ahead of her this time. She has taken percocet in past even though she has an allergy to codeine.     Vaginal Bleeding Associated symptoms include abdominal pain.      Past Medical History  Diagnosis Date  . Environmental allergies   . Panic attacks   . Preterm labor   . Anemia   . Abnormal cervical Papanicolaou smear     cryo  . Pregnancy induced hypertension   . DUB (dysfunctional uterine bleeding)     Past Surgical History  Procedure Laterality Date  . Cryotherapy    . Tubal ligation    . Laparoscopic tubal ligation Bilateral 11/10/2013    Procedure: bilateral LAPAROSCOPIC TUBAL LIGATION with filshie clips;  Surgeon: Freddrick MarchKendra H. Tenny Crawoss, MD;  Location: [facility];  Service: Gynecology;  Laterality: Bilateral;    Family History  Problem Relation Age of Onset  . Hypertension Mother   . Hypertension Father   . Hyperlipidemia Father   . Diabetes Maternal Grandmother   . Cancer Paternal Grandmother     started in lung  . Hypertension Paternal Grandmother     History  Substance Use Topics  . Smoking status: Current Every Day Smoker -- 0.25 packs/day for 5 years    Types: Cigarettes  . Smokeless tobacco: Never Used     Comment: stopped with preg  . Alcohol Use: No    Allergies:  Allergies  Allergen Reactions  . Hydrocodone Other (See Comments)    Pt states that this  medication causes her to pass out.   . Naprosyn [Naproxen] Itching and Rash    Prescriptions prior to admission  Medication Sig Dispense Refill  . hydrochlorothiazide (HYDRODIURIL) 25 MG tablet Take 25 mg by mouth daily.      . medroxyPROGESTERone (PROVERA) 10 MG tablet Take 1 tablet (10 mg total) by mouth daily.  30 tablet  1  . Pediatric Multiple Vit-C-FA (FLINSTONES GUMMIES OMEGA-3 DHA) CHEW Chew 2 each by mouth daily.       . traMADol (ULTRAM) 50 MG tablet Take 1 tablet (50 mg total) by mouth every 6 (six) hours as needed for moderate pain.  30 tablet  0    Review of Systems  Constitutional: Negative.   HENT: Negative.   Eyes: Negative.   Respiratory: Negative.   Cardiovascular: Negative.   Gastrointestinal: Positive for abdominal pain.  Genitourinary: Positive for vaginal bleeding.       Vaginal bleeding  Musculoskeletal: Negative.   Skin: Negative.   Neurological: Negative.   Psychiatric/Behavioral: Negative.    Physical Exam   Blood pressure 138/96, pulse 83, temperature 98.2 F (36.8 C), temperature source Oral, resp. rate 18, height 5\' 2"  (1.575 m), weight 60.782 kg (134 lb), last menstrual period 03/09/2014, not currently breastfeeding.  Physical Exam  Constitutional: She is oriented to  person, place, and time. She appears well-developed and well-nourished. No distress.  HENT:  Head: Normocephalic and atraumatic.  Eyes: Pupils are equal, round, and reactive to light.  Cardiovascular: Normal rate, regular rhythm and normal heart sounds.   Respiratory: Effort normal and breath sounds normal. No respiratory distress. She has no wheezes. She has no rales.  GI: Soft. Bowel sounds are normal. She exhibits no distension. There is tenderness. There is no rebound.  Genitourinary:  Genital:external negative Vaginal:small amount pink tinged discharge Cervix:closed Bimanual:some tenderness   Musculoskeletal: Normal range of motion.  Neurological: She is alert and oriented  to person, place, and time.  Skin: Skin is warm and dry.  Psychiatric: She has a normal mood and affect. Her behavior is normal. Judgment and thought content normal.    MAU Course  Procedures  MDM Percocet/ phenergan Spoke with Dr Emelda Fear who advised Megace 40 mg TID till bleeding stops then daily till surgery  Assessment and Plan   A: DUB  P: Megace 40 mg TID till bleeding stops then daily Follow up with Dr Debroah Loop for surgery Use Ultram as needed for pain  Carolynn Serve 03/09/2014, 7:03 PM

## 2014-03-09 NOTE — Discharge Instructions (Signed)
Abdominal Hysterectomy Abdominal hysterectomy is a surgical procedure to remove your womb (uterus). Your uterus is the muscular organ that contains a developing baby. This surgery is done for many reasons. You may need an abdominal hysterectomy if you have cancer, growths (tumors), long-term pain, or bleeding. You may also have this procedure if your uterus has slipped down into your vagina (uterine prolapse). Depending on why you need an abdominal hysterectomy, you may also have other reproductive organs removed. These could include the part of your vagina that connects with your uterus (cervix), the organs that make eggs (ovaries), and the tubes that connect the ovaries to the uterus (fallopian tubes). LET YOUR HEALTH CARE PROVIDER KNOW ABOUT:   Any allergies you have.  All medicines you are taking, including vitamins, herbs, eye drops, creams, and over-the-counter medicines.  Previous problems you or members of your family have had with the use of anesthetics.  Any blood disorders you have.  Previous surgeries you have had.  Medical conditions you have. RISKS AND COMPLICATIONS Generally, this is a safe procedure. However, as with any procedure, problems can occur. Infection is the most common problem after an abdominal hysterectomy. Other possible problems include:  Bleeding.  Formation of blood clots that may break free and travel to your lungs.  Injury to other organs near your uterus.  Nerve injury causing nerve pain.  Decreased interest in sex or pain during sexual intercourse. BEFORE THE PROCEDURE  Abdominal hysterectomy is a major surgical procedure. It can affect the way you feel about yourself. Talk to your health care provider about the physical and emotional changes hysterectomy may cause.  You may need to have blood work and X-rays done before surgery.  Quit smoking if you smoke. Ask your health care provider for help if you are struggling to quit.  Stop taking  medicines that thin your blood as directed by your health care provider.  You may be instructed to take antibiotic medicines or laxatives before surgery.  Do not eat or drink anything for 6-8 hours before surgery.  Take your regular medicines with a small sip of water.  Bathe or shower the night or morning before surgery. PROCEDURE  Abdominal hysterectomy is done in the operating room at the hospital.  In most cases, you will be given a medicine that makes you go to sleep (general anesthetic).  The surgeon will make a cut (incision) through the skin in your lower belly.  The incision may be about 5-7 inches long. It may go side-to-side or up-and-down.  The surgeon will move aside the body tissue that covers your uterus. The surgeon will then carefully take out your uterus along with any of your other reproductive organs that need to be removed.  Bleeding will be controlled with clamps or sutures.  The surgeon will close your incision with sutures or metal clips. AFTER THE PROCEDURE  You will have some pain immediately after the procedure.  You will be given pain medicine in the recovery room.  You will be taken to your hospital room when you have recovered from the anesthesia.  You may need to stay in the hospital for 2-5 days.  You will be given instructions for recovery at home. Document Released: 07/18/2013 Document Reviewed: 07/18/2013 Exit[employer] Patient Information 2015 [employer]. This information is not intended to replace advice given to you by your health care provider. Make sure you discuss any questions you have with your health care provider.  

## 2014-03-14 ENCOUNTER — Encounter (HOSPITAL_COMMUNITY): Payer: Self-pay

## 2014-03-14 ENCOUNTER — Inpatient Hospital Stay (HOSPITAL_COMMUNITY)
Admission: AD | Admit: 2014-03-14 | Discharge: 2014-03-14 | Disposition: A | Discharge status: Home / Self Care | Payer: BC Managed Care – PPO | Source: Ambulatory Visit | Attending: Family Medicine | Admitting: Family Medicine

## 2014-03-14 DIAGNOSIS — N938 Other specified abnormal uterine and vaginal bleeding: Secondary | ICD-10-CM | POA: Insufficient documentation

## 2014-03-14 DIAGNOSIS — N949 Unspecified condition associated with female genital organs and menstrual cycle: Secondary | ICD-10-CM

## 2014-03-14 DIAGNOSIS — N925 Other specified irregular menstruation: Secondary | ICD-10-CM | POA: Diagnosis present

## 2014-03-14 DIAGNOSIS — R109 Unspecified abdominal pain: Secondary | ICD-10-CM | POA: Insufficient documentation

## 2014-03-14 DIAGNOSIS — F17200 Nicotine dependence, unspecified, uncomplicated: Secondary | ICD-10-CM | POA: Diagnosis not present

## 2014-03-14 LAB — CBC
HCT: 41.5 % (ref 36.0–46.0)
Hemoglobin: 13.9 g/dL (ref 12.0–15.0)
MCH: 27.7 pg (ref 26.0–34.0)
MCHC: 33.5 g/dL (ref 30.0–36.0)
MCV: 82.8 fL (ref 78.0–100.0)
Platelets: 188 10*3/uL (ref 150–400)
RBC: 5.01 MIL/uL (ref 3.87–5.11)
RDW: 13 % (ref 11.5–15.5)
WBC: 3 10*3/uL — ABNORMAL LOW (ref 4.0–10.5)

## 2014-03-14 MED ORDER — HYDROCODONE-ACETAMINOPHEN 5-325 MG PO TABS
2.0000 | ORAL_TABLET | Freq: Once | ORAL | Status: AC
  Administered 2014-03-14: 2 via ORAL
  Filled 2014-03-14: qty 2

## 2014-03-14 MED ORDER — HYDROCODONE-ACETAMINOPHEN 5-325 MG PO TABS: 1.0000 | ORAL_TABLET | ORAL | Status: DC | PRN

## 2014-03-14 NOTE — MAU Provider Note (Signed)
History     CSN: [id number]  Arrival date and time: 03/14/14 1552   None     Chief Complaint  Patient presents with  . Abdominal Pain  . Vaginal Bleeding   HPI  Ms. Brenda Blankenship is a 28 y.o. female, non pregnant G3P2012 who presents with vaginal bleeding and abdominal pain. She has been to MAU several times in the last month 8/5, 8/13, and 8/14. She saw Dr. Debroah Loop in the clinic on 8/13 and they discussed the patient undergoing a hysterectomy. The patient agreed to the plan of having surgery and is waiting for a letter from the clinic stating when her surgery would be scheduled. She was started on megace 40 mg TID on 8/14 and does not feel the medication has stopped her bleeding. The patient is concerned about the amount of pain and bleeding she is having and does not feel she can wait any longer to have surgery. She was given an RX for tramadol and feels that helps minimally. She has had several RX for narcotic pain medication over the past 1-2 months per the [place] narcotic web site. The patient is very tearful and states that she does not want to take narcotics for pain however she cannot handle the pain. She is not interested in referral to pain management/clinic because she has children at home and will not be able to travel.   Pt had BTL this year by Dr. Tenny Craw; per the patient she is no longer a patient of [facility].   OB History   Grav Para Term Preterm Abortions TAB SAB Ect Mult Living   3 2 2  0 1 1 0 0 0 2      Past Medical History  Diagnosis Date  . Environmental allergies   . Panic attacks   . Preterm labor   . Anemia   . Abnormal cervical Papanicolaou smear     cryo  . Pregnancy induced hypertension   . DUB (dysfunctional uterine bleeding)     Past Surgical History  Procedure Laterality Date  . Cryotherapy    . Tubal ligation    . Laparoscopic tubal ligation Bilateral 11/10/2013    Procedure: bilateral LAPAROSCOPIC TUBAL LIGATION with filshie clips;   Surgeon: Freddrick March. Tenny Craw, MD;  Location: [facility];  Service: Gynecology;  Laterality: Bilateral;    Family History  Problem Relation Age of Onset  . Hypertension Mother   . Hypertension Father   . Hyperlipidemia Father   . Diabetes Maternal Grandmother   . Cancer Paternal Grandmother     started in lung  . Hypertension Paternal Grandmother     History  Substance Use Topics  . Smoking status: Current Every Day Smoker -- 0.25 packs/day for 5 years    Types: Cigarettes  . Smokeless tobacco: Never Used     Comment: stopped with preg  . Alcohol Use: No    Allergies:  Allergies  Allergen Reactions  . Hydrocodone Other (See Comments)    Pt states that this medication causes her to pass out.   . Naprosyn [Naproxen] Itching and Rash    Prescriptions prior to admission  Medication Sig Dispense Refill  . hydrochlorothiazide (HYDRODIURIL) 25 MG tablet Take 25 mg by mouth daily.      . medroxyPROGESTERone (PROVERA) 10 MG tablet Take 1 tablet (10 mg total) by mouth daily.  30 tablet  1  . megestrol (MEGACE) 40 MG tablet Take 1 tablet (40 mg total) by mouth 3 (three) times daily.  Daily when bleeding stops  45 tablet  1  . Pediatric Multiple Vit-C-FA (FLINSTONES GUMMIES OMEGA-3 DHA) CHEW Chew 2 each by mouth daily.       . traMADol (ULTRAM) 50 MG tablet Take 1 tablet (50 mg total) by mouth every 6 (six) hours as needed for moderate pain.  30 tablet  0   Results for orders placed during the hospital encounter of 03/14/14 (from the past 48 hour(s))  CBC     Status: Abnormal   Collection Time    03/14/14  4:41 PM      Result Value Ref Range   WBC 3.0 (*) 4.0 - 10.5 K/uL   RBC 5.01  3.87 - 5.11 MIL/uL   Hemoglobin 13.9  12.0 - 15.0 g/dL   HCT 40.941.5  81.136.0 - 91.446.0 %   MCV 82.8  78.0 - 100.0 fL   MCH 27.7  26.0 - 34.0 pg   MCHC 33.5  30.0 - 36.0 g/dL   RDW 78.213.0  95.611.5 - 21.315.5 %   Platelets 188  150 - 400 K/uL    Review of Systems  Constitutional: Positive for weight loss and  malaise/fatigue. Negative for fever and chills.  Gastrointestinal: Positive for nausea and abdominal pain.  Genitourinary:       No vaginal discharge. + vaginal bleeding. No dysuria.   Neurological: Positive for weakness. Negative for headaches.   Physical Exam   Blood pressure 115/81, pulse 90, temperature 98.3 F (36.8 C), resp. rate 16, height 5\' 2"  (1.575 m), weight 58.968 kg (130 lb), last menstrual period 03/09/2014, not currently breastfeeding.  Physical Exam  Constitutional: She is oriented to person, place, and time. She appears well-developed and well-nourished. No distress.  HENT:  Head: Normocephalic.  Eyes: Pupils are equal, round, and reactive to light.  Neck: Neck supple.  GI: Soft. She exhibits no distension and no mass. There is tenderness (Tenderness throughout lower abdomen ). There is no rebound and no guarding.  Genitourinary:  Speculum exam: Vagina - Scant amount of mucus like blood, Mild odor Cervix - + blood from cervix; scant amount  Bimanual exam: Cervix closed Uterine tenderness, normal size Adnexa non tender, no masses bilaterally Chaperone present for exam.   Musculoskeletal: Normal range of motion.  Neurological: She is alert and oriented to person, place, and time.  Skin: Skin is warm. She is not diaphoretic.  Psychiatric: Her mood appears anxious. Her affect is not angry. She exhibits a depressed mood.  Pt is tearful     MAU Course  Procedures None  MDM Vicodin 2 tabs in MAU Discussed patient management  Discussed patient with Dr. Emelda FearFerguson  Message sent to the clinic  Assessment and Plan   A: 1. DUB (dysfunctional uterine bleeding)    P: Discharge home in stable condition RX: Vicodin #15 no refill Message sent to the clinic regarding scheduling a surgery date Bleeding precautions discussed Non medication pain management options discussed  Return to MAU as needed, if symptoms worsen   RASCH, JENNIFER IRENE 03/14/2014, 4:52 PM

## 2014-03-14 NOTE — Discharge Instructions (Signed)
Abnormal Uterine Bleeding Abnormal uterine bleeding can affect women at various stages in life, including teenagers, women in their reproductive years, pregnant women, and women who have reached menopause. Several kinds of uterine bleeding are considered abnormal, including:  Bleeding or spotting between periods.   Bleeding after sexual intercourse.   Bleeding that is heavier or more than normal.   Periods that last longer than usual.  Bleeding after menopause.  Many cases of abnormal uterine bleeding are minor and simple to treat, while others are more serious. Any type of abnormal bleeding should be evaluated by your health care provider. Treatment will depend on the cause of the bleeding. HOME CARE INSTRUCTIONS Monitor your condition for any changes. The following actions may help to alleviate any discomfort you are experiencing:  Avoid the use of tampons and douches as directed by your health care provider.  Change your pads frequently. You should get regular pelvic exams and Pap tests. Keep all follow-up appointments for diagnostic tests as directed by your health care provider.  SEEK MEDICAL CARE IF:   Your bleeding lasts more than 1 week.   You feel dizzy at times.  SEEK IMMEDIATE MEDICAL CARE IF:   You pass out.   You are changing pads every 15 to 30 minutes.   You have abdominal pain.  You have a fever.   You become sweaty or weak.   You are passing large blood clots from the vagina.   You start to feel nauseous and vomit. MAKE SURE YOU:   Understand these instructions.  Will watch your condition.  Will get help right away if you are not doing well or get worse. Document Released: 07/13/2005 Document Revised: 07/18/2013 Document Reviewed: 02/09/2013 [facility] Patient Information 2015 [place], [place]. This information is not intended to replace advice given to you by your health care provider. Make sure you discuss any questions you have with your  health care provider.  Abdominal Hysterectomy Abdominal hysterectomy is a surgery to remove your womb (uterus). Your womb is the part of your body that contains a growing baby. The surgery may be done for many reasons. These may include cancer, growths (tumors), long-term pain, or bleeding. You may also need other reproductive parts removed during this surgery. This will depend on why you need to have the surgery. BEFORE THE PROCEDURE  Talk to your doctor about the changes to your body. These changes may be physical and emotional.  You may need to have blood work done. You may also need X-rays done.  Quit smoking if you smoke. Ask your doctor for help.  Stop taking medicines that thin your blood as told by your doctor.  Your doctor may have you take other medicines. Take all medicines as told by your doctor.  Do not eat or drink anything for 6-8 hours before surgery.  Take your normal medicines with a small sip of water.  Shower or take a bath the night or morning before surgery. PROCEDURE  This surgery is done in the hospital.  You are given a medicine that makes you go to sleep (general anesthetic).  The doctor will make a cut (incision) through the skin in your lower belly.  The cut may be about 5-7 inches long. It may go side-to-side or up-and-down.  The doctor will move the body tissue that covers your womb. The doctor will carefully remove your womb. The doctor may remove any other reproductive parts that need to be removed.  The doctor will use clamps or  stitches (sutures) to control bleeding.  The doctor will close your cut with stitches or metal clips. AFTER THE PROCEDURE  You will have pain right after the procedure.  You will be given pain medicine in the recovery room.  You will be taken to your hospital room after the medicines that made you go to sleep wear off.  You will be told how to take care of yourself at home. Document Released: 07/18/2013 Document  Reviewed: 07/18/2013 [facility] Patient Information 2015 [place]. This information is not intended to replace advice given to you by your health care provider. Make sure you discuss any questions you have with your health care provider.

## 2014-03-14 NOTE — MAU Note (Signed)
Pt presents to MAU with complaints of having vaginal bleeding with extreme abdominal. She was evaluated in MAU on August the 14th and given medications to slow down the bleeding and it hasnt stopped the bleeding

## 2014-03-15 ENCOUNTER — Other Ambulatory Visit: Payer: Self-pay | Admitting: Obstetrics & Gynecology

## 2014-03-16 NOTE — MAU Provider Note (Signed)
Attestation of Attending Supervision of Advanced Practitioner: Evaluation and management procedures were performed by the PA/NP/CNM/OB Fellow under my supervision/collaboration. Chart reviewed and agree with management and plan.  Seri Kimmer V 03/16/2014 7:42 AM

## 2014-03-20 ENCOUNTER — Encounter: Payer: Self-pay | Admitting: Obstetrics & Gynecology

## 2014-03-20 ENCOUNTER — Ambulatory Visit (INDEPENDENT_AMBULATORY_CARE_PROVIDER_SITE_OTHER): Payer: BC Managed Care – PPO | Admitting: Obstetrics & Gynecology

## 2014-03-20 VITALS — BP 133/108 | HR 71 | Ht 63.0 in | Wt 132.0 lb

## 2014-03-20 DIAGNOSIS — N925 Other specified irregular menstruation: Secondary | ICD-10-CM | POA: Diagnosis not present

## 2014-03-20 DIAGNOSIS — N949 Unspecified condition associated with female genital organs and menstrual cycle: Secondary | ICD-10-CM | POA: Diagnosis not present

## 2014-03-20 DIAGNOSIS — N938 Other specified abnormal uterine and vaginal bleeding: Secondary | ICD-10-CM

## 2014-03-20 DIAGNOSIS — N946 Dysmenorrhea, unspecified: Secondary | ICD-10-CM | POA: Diagnosis not present

## 2014-03-20 MED ORDER — MEDROXYPROGESTERONE ACETATE 10 MG PO TABS
10.0000 mg | ORAL_TABLET | Freq: Every day | ORAL | Status: DC
Start: 2014-03-20 — End: 2014-04-13

## 2014-03-20 MED ORDER — OXYCODONE-ACETAMINOPHEN 5-325 MG PO TABS: 1.0000 | ORAL_TABLET | ORAL | Status: DC | PRN

## 2014-03-20 MED ORDER — IBUPROFEN 400 MG PO TABS: 400.0000 mg | ORAL_TABLET | Freq: Four times a day (QID) | ORAL | Status: DC | PRN

## 2014-03-20 NOTE — Progress Notes (Signed)
Continued heavy bleeding, continued pelvic pain.  Is scheduled for surgery with Dr. Debroah Loop 9/17.  Sent here to address current issues because [facility] clinic did not have any appointments.  Discontinued the Megace due to dizziness and vomiting.

## 2014-03-20 NOTE — Progress Notes (Signed)
   Subjective:    Patient ID: Brenda Blankenship, female    DOB: Feb 15, 1986, 28 y.o.   MRN: [id number]  HPI  28 yo M AA [id number] (52 month old daughter and 28 yo son) here today because she would like an earlier surgery date. She is scheduled for a TVH on 9-17 with Dr. Debroah Loop. Her hemoglobin is 13.9.  Review of Systems     Objective:   Physical Exam        Assessment & Plan:  Pain management until surgery 04-12-14 I will give her a refill of provera, #30 percocet and she will combine this with IBU 400 mg.  Marnie called [place] and there are no sooner openings.

## 2014-03-28 ENCOUNTER — Encounter: Payer: Self-pay | Admitting: Obstetrics & Gynecology

## 2014-03-30 ENCOUNTER — Encounter (HOSPITAL_COMMUNITY): Payer: Self-pay | Admitting: Pharmacist

## 2014-03-30 ENCOUNTER — Ambulatory Visit: Payer: BC Managed Care – PPO | Admitting: Obstetrics & Gynecology

## 2014-04-05 NOTE — Patient Instructions (Signed)
Your procedure is scheduled on:  Thursday, April 12, 2014  Enter through the Main Entrance of [facility] at: 8:30 am  Pick up the phone at the desk and dial [phone].  Call this number if you have problems the morning of surgery: [phone].  Remember: Do NOT eat food:  After midnight Wednesday Do NOT drink clear liquids after:  After midnight Wednesday  Take these medicines the morning of surgery with a SIP OF WATER: Hydrochlorothiazide  Do NOT wear jewelry (body piercing), metal hair clips/bobby pins, make-up, or nail polish. Do NOT wear lotions, powders, or perfumes.  You may wear deoderant. Do NOT shave for 48 hours prior to surgery. Do NOT bring valuables to the hospital. Contacts, dentures, or bridgework may not be worn into surgery. Leave suitcase in car.  After surgery it may be brought to your room.  For patients admitted to the hospital, checkout time is 11:00 AM the day of discharge.

## 2014-04-06 ENCOUNTER — Encounter (HOSPITAL_COMMUNITY)
Admission: RE | Admit: 2014-04-06 | Discharge: 2014-04-06 | Disposition: A | Discharge status: Home / Self Care | Payer: BC Managed Care – PPO | Source: Ambulatory Visit | Attending: Obstetrics & Gynecology | Admitting: Obstetrics & Gynecology

## 2014-04-06 ENCOUNTER — Encounter (HOSPITAL_COMMUNITY): Payer: Self-pay

## 2014-04-06 DIAGNOSIS — Z01812 Encounter for preprocedural laboratory examination: Secondary | ICD-10-CM | POA: Insufficient documentation

## 2014-04-06 DIAGNOSIS — N949 Unspecified condition associated with female genital organs and menstrual cycle: Secondary | ICD-10-CM | POA: Diagnosis not present

## 2014-04-06 DIAGNOSIS — N938 Other specified abnormal uterine and vaginal bleeding: Secondary | ICD-10-CM | POA: Insufficient documentation

## 2014-04-06 DIAGNOSIS — N946 Dysmenorrhea, unspecified: Secondary | ICD-10-CM | POA: Insufficient documentation

## 2014-04-06 DIAGNOSIS — N925 Other specified irregular menstruation: Secondary | ICD-10-CM | POA: Diagnosis not present

## 2014-04-06 LAB — BASIC METABOLIC PANEL
Anion gap: 12 (ref 5–15)
BUN: 6 mg/dL (ref 6–23)
CO2: 23 mEq/L (ref 19–32)
Calcium: 8.6 mg/dL (ref 8.4–10.5)
Chloride: 103 mEq/L (ref 96–112)
Creatinine, Ser: 0.86 mg/dL (ref 0.50–1.10)
GFR calc Af Amer: >90 mL/min (ref >90)
GFR calc non Af Amer: >90 mL/min (ref >90)
Glucose, Bld: 90 mg/dL (ref 70–99)
Potassium: 4.3 mEq/L (ref 3.7–5.3)
Sodium: 138 mEq/L (ref 137–147)

## 2014-04-06 LAB — CBC
HCT: 37.3 % (ref 36.0–46.0)
Hemoglobin: 12.3 g/dL (ref 12.0–15.0)
MCH: 27.5 pg (ref 26.0–34.0)
MCHC: 33 g/dL (ref 30.0–36.0)
MCV: 83.3 fL (ref 78.0–100.0)
Platelets: 213 10*3/uL (ref 150–400)
RBC: 4.48 MIL/uL (ref 3.87–5.11)
RDW: 13.2 % (ref 11.5–15.5)
WBC: 3.1 10*3/uL — ABNORMAL LOW (ref 4.0–10.5)

## 2014-04-12 ENCOUNTER — Encounter (HOSPITAL_COMMUNITY): Payer: Self-pay | Admitting: Anesthesiology

## 2014-04-12 ENCOUNTER — Encounter (HOSPITAL_COMMUNITY): Payer: BC Managed Care – PPO | Admitting: Anesthesiology

## 2014-04-12 ENCOUNTER — Observation Stay (HOSPITAL_COMMUNITY)
Admission: RE | Admit: 2014-04-12 | Discharge: 2014-04-13 | Disposition: A | Discharge status: Home / Self Care | Payer: BC Managed Care – PPO | Source: Ambulatory Visit | Attending: Obstetrics & Gynecology | Admitting: Obstetrics & Gynecology

## 2014-04-12 ENCOUNTER — Ambulatory Visit (HOSPITAL_COMMUNITY): Payer: BC Managed Care – PPO | Admitting: Anesthesiology

## 2014-04-12 ENCOUNTER — Encounter (HOSPITAL_COMMUNITY)
Admission: RE | Disposition: A | Discharge status: Home / Self Care | Payer: Self-pay | Source: Ambulatory Visit | Attending: Obstetrics & Gynecology

## 2014-04-12 ENCOUNTER — Encounter: Payer: Self-pay | Admitting: *Deleted

## 2014-04-12 DIAGNOSIS — N925 Other specified irregular menstruation: Secondary | ICD-10-CM | POA: Diagnosis present

## 2014-04-12 DIAGNOSIS — Z9889 Other specified postprocedural states: Secondary | ICD-10-CM

## 2014-04-12 DIAGNOSIS — N946 Dysmenorrhea, unspecified: Secondary | ICD-10-CM | POA: Diagnosis not present

## 2014-04-12 DIAGNOSIS — F410 Panic disorder [episodic paroxysmal anxiety] without agoraphobia: Secondary | ICD-10-CM | POA: Insufficient documentation

## 2014-04-12 DIAGNOSIS — N938 Other specified abnormal uterine and vaginal bleeding: Secondary | ICD-10-CM

## 2014-04-12 DIAGNOSIS — N859 Noninflammatory disorder of uterus, unspecified: Secondary | ICD-10-CM | POA: Insufficient documentation

## 2014-04-12 DIAGNOSIS — N72 Inflammatory disease of cervix uteri: Secondary | ICD-10-CM | POA: Diagnosis not present

## 2014-04-12 DIAGNOSIS — N949 Unspecified condition associated with female genital organs and menstrual cycle: Secondary | ICD-10-CM | POA: Diagnosis not present

## 2014-04-12 DIAGNOSIS — N856 Intrauterine synechiae: Secondary | ICD-10-CM | POA: Insufficient documentation

## 2014-04-12 DIAGNOSIS — Z885 Allergy status to narcotic agent status: Secondary | ICD-10-CM | POA: Insufficient documentation

## 2014-04-12 DIAGNOSIS — Z79899 Other long term (current) drug therapy: Secondary | ICD-10-CM | POA: Diagnosis not present

## 2014-04-12 HISTORY — PX: BILATERAL SALPINGECTOMY: SHX5743

## 2014-04-12 HISTORY — PX: VAGINAL HYSTERECTOMY: SHX2639

## 2014-04-12 LAB — PREGNANCY, URINE: Preg Test, Ur: NEGATIVE

## 2014-04-12 SURGERY — HYSTERECTOMY, VAGINAL
Anesthesia: General | Site: Vagina

## 2014-04-12 MED ORDER — NEOSTIGMINE METHYLSULFATE 10 MG/10ML IV SOLN
INTRAVENOUS | Status: DC | PRN
  Administered 2014-04-12: 3 mg via INTRAVENOUS

## 2014-04-12 MED ORDER — GLYCOPYRROLATE 0.2 MG/ML IJ SOLN
INTRAMUSCULAR | Status: DC | PRN
  Administered 2014-04-12: 0.6 mg via INTRAVENOUS

## 2014-04-12 MED ORDER — PROPOFOL 10 MG/ML IV EMUL
INTRAVENOUS | Status: AC
  Filled 2014-04-12: qty 20

## 2014-04-12 MED ORDER — LIDOCAINE-EPINEPHRINE (PF) 1 %-1:200000 IJ SOLN
INTRAMUSCULAR | Status: DC | PRN
  Administered 2014-04-12: 30 mL

## 2014-04-12 MED ORDER — ONDANSETRON HCL 4 MG/2ML IJ SOLN: 4.0000 mg | Freq: Four times a day (QID) | INTRAMUSCULAR | Status: DC | PRN

## 2014-04-12 MED ORDER — SCOPOLAMINE 1 MG/3DAYS TD PT72
MEDICATED_PATCH | TRANSDERMAL | Status: AC
  Administered 2014-04-12: 1.5 mg via TRANSDERMAL
  Filled 2014-04-12: qty 1

## 2014-04-12 MED ORDER — FENTANYL CITRATE 0.05 MG/ML IJ SOLN
INTRAMUSCULAR | Status: AC
  Filled 2014-04-12: qty 5

## 2014-04-12 MED ORDER — MIDAZOLAM HCL 2 MG/2ML IJ SOLN
INTRAMUSCULAR | Status: DC | PRN
  Administered 2014-04-12: 2 mg via INTRAVENOUS

## 2014-04-12 MED ORDER — FENTANYL CITRATE 0.05 MG/ML IJ SOLN
INTRAMUSCULAR | Status: DC | PRN
Start: 2014-04-12 — End: 2014-04-12
  Administered 2014-04-12 (×4): 50 ug via INTRAVENOUS
  Administered 2014-04-12: 100 ug via INTRAVENOUS
  Administered 2014-04-12: 50 ug via INTRAVENOUS

## 2014-04-12 MED ORDER — CEFAZOLIN SODIUM-DEXTROSE 2-3 GM-% IV SOLR
INTRAVENOUS | Status: AC
  Filled 2014-04-12: qty 50

## 2014-04-12 MED ORDER — LACTATED RINGERS IV SOLN: INTRAVENOUS | Status: DC

## 2014-04-12 MED ORDER — KETOROLAC TROMETHAMINE 30 MG/ML IJ SOLN
INTRAMUSCULAR | Status: DC | PRN
  Administered 2014-04-12: 30 mg via INTRAVENOUS

## 2014-04-12 MED ORDER — GLYCOPYRROLATE 0.2 MG/ML IJ SOLN
INTRAMUSCULAR | Status: AC
  Filled 2014-04-12: qty 3

## 2014-04-12 MED ORDER — HYDROMORPHONE HCL 1 MG/ML IJ SOLN
INTRAMUSCULAR | Status: AC
  Filled 2014-04-12: qty 1

## 2014-04-12 MED ORDER — DEXAMETHASONE SODIUM PHOSPHATE 10 MG/ML IJ SOLN
INTRAMUSCULAR | Status: AC
  Filled 2014-04-12: qty 1

## 2014-04-12 MED ORDER — ONDANSETRON HCL 4 MG/2ML IJ SOLN
INTRAMUSCULAR | Status: AC
  Filled 2014-04-12: qty 2

## 2014-04-12 MED ORDER — ONDANSETRON HCL 4 MG/2ML IJ SOLN
INTRAMUSCULAR | Status: DC | PRN
  Administered 2014-04-12: 4 mg via INTRAVENOUS

## 2014-04-12 MED ORDER — ROCURONIUM BROMIDE 100 MG/10ML IV SOLN
INTRAVENOUS | Status: AC
  Filled 2014-04-12: qty 1

## 2014-04-12 MED ORDER — NEOSTIGMINE METHYLSULFATE 10 MG/10ML IV SOLN
INTRAVENOUS | Status: AC
  Filled 2014-04-12: qty 1

## 2014-04-12 MED ORDER — LIDOCAINE-EPINEPHRINE (PF) 1 %-1:200000 IJ SOLN
INTRAMUSCULAR | Status: AC
  Filled 2014-04-12: qty 10

## 2014-04-12 MED ORDER — OXYCODONE-ACETAMINOPHEN 5-325 MG PO TABS
1.0000 | ORAL_TABLET | ORAL | Status: DC | PRN
  Administered 2014-04-12 – 2014-04-13 (×5): 2 via ORAL
  Filled 2014-04-12 (×6): qty 2

## 2014-04-12 MED ORDER — HYDROMORPHONE HCL 1 MG/ML IJ SOLN
1.0000 mg | INTRAMUSCULAR | Status: DC | PRN
  Administered 2014-04-12: 1 mg via INTRAVENOUS
  Administered 2014-04-12: 2 mg via INTRAVENOUS
  Filled 2014-04-12: qty 1
  Filled 2014-04-12: qty 2

## 2014-04-12 MED ORDER — MIDAZOLAM HCL 2 MG/2ML IJ SOLN
INTRAMUSCULAR | Status: AC
  Filled 2014-04-12: qty 2

## 2014-04-12 MED ORDER — ONDANSETRON HCL 4 MG PO TABS: 4.0000 mg | ORAL_TABLET | Freq: Four times a day (QID) | ORAL | Status: DC | PRN

## 2014-04-12 MED ORDER — HYDROMORPHONE HCL 1 MG/ML IJ SOLN
0.2500 mg | INTRAMUSCULAR | Status: DC | PRN
  Administered 2014-04-12 (×3): 0.5 mg via INTRAVENOUS

## 2014-04-12 MED ORDER — METOCLOPRAMIDE HCL 5 MG/ML IJ SOLN
INTRAMUSCULAR | Status: AC
  Filled 2014-04-12: qty 2

## 2014-04-12 MED ORDER — PROPOFOL 10 MG/ML IV BOLUS
INTRAVENOUS | Status: DC | PRN
  Administered 2014-04-12: 190 mg via INTRAVENOUS

## 2014-04-12 MED ORDER — ROCURONIUM BROMIDE 100 MG/10ML IV SOLN
INTRAVENOUS | Status: DC | PRN
  Administered 2014-04-12: 40 mg via INTRAVENOUS

## 2014-04-12 MED ORDER — KETOROLAC TROMETHAMINE 30 MG/ML IJ SOLN
INTRAMUSCULAR | Status: AC
  Filled 2014-04-12: qty 1

## 2014-04-12 MED ORDER — LACTATED RINGERS IV SOLN
INTRAVENOUS | Status: DC
  Administered 2014-04-12 (×2): via INTRAVENOUS

## 2014-04-12 MED ORDER — METOCLOPRAMIDE HCL 5 MG/ML IJ SOLN
10.0000 mg | Freq: Once | INTRAMUSCULAR | Status: AC | PRN
  Administered 2014-04-12: 10 mg via INTRAVENOUS

## 2014-04-12 MED ORDER — MEPERIDINE HCL 25 MG/ML IJ SOLN: 6.2500 mg | INTRAMUSCULAR | Status: DC | PRN

## 2014-04-12 MED ORDER — DEXAMETHASONE SODIUM PHOSPHATE 10 MG/ML IJ SOLN
INTRAMUSCULAR | Status: DC | PRN
  Administered 2014-04-12: 4 mg via INTRAVENOUS

## 2014-04-12 MED ORDER — FENTANYL CITRATE 0.05 MG/ML IJ SOLN
INTRAMUSCULAR | Status: AC
  Filled 2014-04-12: qty 2

## 2014-04-12 MED ORDER — CEFAZOLIN SODIUM-DEXTROSE 2-3 GM-% IV SOLR
2.0000 g | INTRAVENOUS | Status: AC
  Administered 2014-04-12: 2 g via INTRAVENOUS

## 2014-04-12 MED ORDER — HYDROMORPHONE HCL 1 MG/ML IJ SOLN
INTRAMUSCULAR | Status: DC | PRN
  Administered 2014-04-12: 1 mg via INTRAVENOUS

## 2014-04-12 MED ORDER — LIDOCAINE HCL (CARDIAC) 20 MG/ML IV SOLN
INTRAVENOUS | Status: DC | PRN
  Administered 2014-04-12: 80 mg via INTRAVENOUS

## 2014-04-12 MED ORDER — DIPHENHYDRAMINE HCL 25 MG PO CAPS
25.0000 mg | ORAL_CAPSULE | Freq: Four times a day (QID) | ORAL | Status: DC | PRN
  Administered 2014-04-12 – 2014-04-13 (×2): 25 mg via ORAL
  Filled 2014-04-12 (×2): qty 1

## 2014-04-12 MED ORDER — SCOPOLAMINE 1 MG/3DAYS TD PT72
1.0000 | MEDICATED_PATCH | Freq: Once | TRANSDERMAL | Status: DC
  Administered 2014-04-12: 1.5 mg via TRANSDERMAL

## 2014-04-12 MED ORDER — LIDOCAINE HCL (CARDIAC) 20 MG/ML IV SOLN
INTRAVENOUS | Status: AC
  Filled 2014-04-12: qty 5

## 2014-04-12 SURGICAL SUPPLY — 23 items
BLADE SURG 10 STRL SS (BLADE) ×3 IMPLANT
CLOTH BEACON ORANGE TIMEOUT ST (SAFETY) ×3 IMPLANT
DRSG TELFA 3X8 NADH (GAUZE/BANDAGES/DRESSINGS) ×3 IMPLANT
ELECT LIGASURE LONG (ELECTRODE) IMPLANT
ELECT LIGASURE SHORT 9 REUSE (ELECTRODE) ×3 IMPLANT
GLOVE BIO SURGEON STRL SZ 6.5 (GLOVE) ×3 IMPLANT
GLOVE BIOGEL PI IND STRL 7.0 (GLOVE) ×2 IMPLANT
GLOVE BIOGEL PI INDICATOR 7.0 (GLOVE) ×1
GLOVE ECLIPSE 6.0 STRL STRAW (GLOVE) ×3 IMPLANT
GOWN STRL REUS W/TWL LRG LVL3 (GOWN DISPOSABLE) ×12 IMPLANT
NS IRRIG 1000ML POUR BTL (IV SOLUTION) ×3 IMPLANT
PACK VAGINAL WOMENS (CUSTOM PROCEDURE TRAY) ×3 IMPLANT
PAD OB MATERNITY 4.3X12.25 (PERSONAL CARE ITEMS) ×3 IMPLANT
SHEARS FOC LG CVD HARMONIC 17C (MISCELLANEOUS) IMPLANT
SUT VIC AB 0 CT1 18XCR BRD8 (SUTURE) ×4 IMPLANT
SUT VIC AB 0 CT1 36 (SUTURE) ×3 IMPLANT
SUT VIC AB 0 CT1 8-18 (SUTURE) ×2
SUT VIC AB 2-0 CT1 27 (SUTURE) ×2
SUT VIC AB 2-0 CT1 TAPERPNT 27 (SUTURE) ×4 IMPLANT
SUT VICRYL 0 TIES 12 18 (SUTURE) ×3 IMPLANT
TOWEL OR 17X24 6PK STRL BLUE (TOWEL DISPOSABLE) ×6 IMPLANT
TRAY FOLEY CATH 14FR (SET/KITS/TRAYS/PACK) ×3 IMPLANT
WATER STERILE IRR 1000ML POUR (IV SOLUTION) IMPLANT

## 2014-04-12 NOTE — Op Note (Signed)
Hysterectomy Procedure Note  Indications: dysfunctional uterine bleeding and dysmenorrhea  Pre-operative Diagnosis: dysfunctional uterine bleeding and dysmenorrhea  Post-operative Diagnosis: same  Operation:transvaginal hysterectomy and bilateral salpingectomy  Surgeon: Scheryl Darter   Assistants: Dr. Nicholaus Bloom  Anesthesia: General endotracheal anesthesia  ASA Class: 2  Procedure Details  The patient was seen in the Holding Room. The risks, benefits, complications, treatment options, and expected outcomes were discussed with the patient.  The patient concurred with the proposed plan, giving informed consent.  The site of surgery properly noted/marked. The patient was taken to Operating Room # 8, identified as SYEDA PRICKETT and the procedure verified as transvaginal hysterectomy Time Out was held and the above information confirmed.  After induction of anesthesia, the patient was draped and prepped in the usual sterile manner. Pt was placed in dorsal lithotomy position after anesthesia and draped and prepped in the usual sterile manner. Foley catheter was placed. Exam revealed normal-size uterus and no adnexal masses. Weighted speculum was inserted and cervix was exposed. Quarter percent Marcaine 1 200,000 epinephrine was infiltrated around the cervix. #10 blade was used to make an incision around the cervix vaginal mucosa was pushed back. The posterior cul-de-sac was entered with curved scissors and the Bonnano retractor was placed. The uterosacral ligaments were clamped cut and suture ligated with 0 Vicryl. The anterior peritoneum was entered with curved scissors. A Deaver was used to retract the bladder. The uterine vessels were coagulated with the LigaSure and cut and hemostasis was seen. The adnexal pedicles were likewise cauterized and cut. The last portion of the pedicle was clamped cut and suture ligated with 0 Vicryl with the double ligatures. Both adnexa were inspected. There was a  small simple cyst on the left ovary which was opened with cautery. Clear fluid was expressed. Fallopian tubes appeared normal. Elected to do salpingectomy. Tubes were grasped with clamps crossclamped and excised with scissors and the pedicles were tied with 0 Vicryl. The perineum and vaginal cuff were closed with a running locking suture with 0 Vicryl. Hemostasis was seen.   Instrument, sponge, and needle counts were correct prior to vaginal closure and at the conclusion of the case.   Findings: Normal-size uterus  Estimated Blood Loss:  less than 100 mL         Drains: Foley catheter         Total IV Fluids: 1000 mL ml         Specimens: Uterus and bilateral fallopian tubes         Implants: None         Complications:  None; patient tolerated the procedure well.         Disposition: PACU - hemodynamically stable.         Condition: stable  Attending Attestation: I was present and scrubbed for the entire procedure.  Dr. Scheryl Darter 04/12/2014 12:03 PM

## 2014-04-12 NOTE — Anesthesia Preprocedure Evaluation (Addendum)
Anesthesia Evaluation  Patient identified by MRN, date of birth, ID band Patient awake    Reviewed: Allergy & Precautions, H&P , NPO status , Patient's Chart, lab work & pertinent test results  Airway Mallampati: III TM Distance: >3 FB Neck ROM: Full    Dental no notable dental hx. (+) Teeth Intact, Poor Dentition, Chipped,    Pulmonary Current Smoker,  breath sounds clear to auscultation  Pulmonary exam normal       Cardiovascular hypertension, Rhythm:Regular Rate:Normal  Hx/o PIH   Neuro/Psych negative neurological ROS  negative psych ROS   GI/Hepatic negative GI ROS, Neg liver ROS,   Endo/Other  negative endocrine ROS  Renal/GU negative Renal ROS  negative genitourinary   Musculoskeletal negative musculoskeletal ROS (+)   Abdominal   Peds  Hematology  (+) anemia ,   Anesthesia Other Findings   Reproductive/Obstetrics Dysmenorrhea DUB                          Anesthesia Physical Anesthesia Plan  ASA: II  Anesthesia Plan: General   Post-op Pain Management:    Induction: Intravenous  Airway Management Planned: Oral ETT  Additional Equipment:   Intra-op Plan:   Post-operative Plan: Extubation in OR  Informed Consent: I have reviewed the patients History and Physical, chart, labs and discussed the procedure including the risks, benefits and alternatives for the proposed anesthesia with the patient or authorized representative who has indicated his/her understanding and acceptance.   Dental advisory given  Plan Discussed with: CRNA, Anesthesiologist and Surgeon  Anesthesia Plan Comments:         Anesthesia Quick Evaluation

## 2014-04-12 NOTE — H&P (Signed)
Patient ID: Brenda Blankenship, female DOB: 1986-01-22, 28 y.o. MRN: [id number]  Chief Complaint   Patient presents with   .  DUB   HPI  Brenda Blankenship is a 28 y.o. female. G3O7564  No LMP recorded. Has been taking Provera.  DUB and pain despite provera tx, transfer from [facility]. Has tried OCP, DMPA, now on provera and wants definitive therapy TVH scheduled today HPI  Past Medical History   Diagnosis  Date   .  Environmental allergies    .  Panic attacks    .  Preterm labor    .  Anemia    .  Abnormal cervical Papanicolaou smear      cryo   .  Pregnancy induced hypertension    .  DUB (dysfunctional uterine bleeding)     Past Surgical History   Procedure  Laterality  Date   .  Cryotherapy     .  Tubal ligation     .  Laparoscopic tubal ligation  Bilateral  11/10/2013     Procedure: bilateral LAPAROSCOPIC TUBAL LIGATION with filshie clips; Surgeon: Freddrick March. Tenny Craw, MD; Location: [facility]; Service: Gynecology; Laterality: Bilateral;    Family History   Problem  Relation  Age of Onset   .  Hypertension  Mother    .  Hypertension  Father    .  Hyperlipidemia  Father    .  Diabetes  Maternal Grandmother    .  Cancer  Paternal Grandmother      started in lung   .  Hypertension  Paternal Grandmother    Social History  History   Substance Use Topics   .  Smoking status:  Current Every Day Smoker -- 0.25 packs/day for 5 years     Types:  Cigarettes   .  Smokeless tobacco:  Never Used      Comment: stopped with preg   .  Alcohol Use:  No    Allergies   Allergen  Reactions   .  Hydrocodone  Other (See Comments)     Pt states that this medication causes her to pass out.   .  Naprosyn [Naproxen]  Itching and Rash    Current Outpatient Prescriptions   Medication  Sig  Dispense  Refill   .  hydrochlorothiazide (HYDRODIURIL) 25 MG tablet  Take 25 mg by mouth daily.     .  medroxyPROGESTERone (PROVERA) 10 MG tablet  Take 1 tablet (10 mg total) by mouth daily.  10 tablet  0   .   Pediatric Multiple Vit-C-FA (FLINSTONES GUMMIES OMEGA-3 DHA) CHEW  Chew 2 tablets by mouth daily.     .  traMADol (ULTRAM) 50 MG tablet  Take 1 tablet (50 mg total) by mouth every 6 (six) hours as needed for moderate pain.  30 tablet  0   .  ibuprofen (ADVIL,MOTRIN) 600 MG tablet  Take 600 mg by mouth every 6 (six) hours as needed for moderate pain.      No current facility-administered medications for this visit.   Review of Systems  Review of Systems  Constitutional: Negative.  Genitourinary: Positive for menstrual problem and pelvic pain. Negative for dysuria, urgency, vaginal bleeding (none today) and difficulty urinating.  Skin: Negative for pallor.  Filed Vitals:   04/12/14 0844  BP: 129/90  Pulse: 67  Temp: 98.2 F (36.8 C)  TempSrc: Oral  Resp: 18  SpO2: 100%    Physical Exam  Physical Exam  Constitutional:  She is oriented to person, place, and time. She appears well-developed. No distress.  Pulmonary/Chest: Effort normal. No respiratory distress.  Abdominal: Soft. She exhibits no distension.  Genitourinary: Vagina normal and uterus normal. No vaginal discharge found.  Mild tenderness left side no mass  Neurological: She is alert and oriented to person, place, and time.  Skin: Skin is warm and dry.  Psychiatric: She has a normal mood and affect. Her behavior is normal.  Data Reviewed  CLINICAL DATA: Pelvic pain and vaginal bleeding  EXAM:  TRANSABDOMINAL AND TRANSVAGINAL ULTRASOUND OF PELVIS  DOPPLER ULTRASOUND OF OVARIES  TECHNIQUE:  Study was performed transabdominally to optimize pelvic field of  view evaluation and transvaginally to optimize internal visceral  architecture evaluation. Ovarian Doppler ultrasound was also  performed  COMPARISON: Nov 29, 2013  FINDINGS:  Uterus  Measurements: 7.4 x 3.7 x 4.9 cm. No fibroids or other mass  visualized. Uterus is anteverted.  Endometrium  Thickness: 7 mm. No focal abnormality visualized. Endometrial  contour is  smooth.  Right ovary  Measurements: 2.6 x 2.8 x 2.3 cm. Normal appearance/no adnexal mass.  There is a somewhat complex small mass in the right ovary measuring  1.5 x 1.3 x 1.9 cm.  Left ovary  Measurements: 3.4 x 2.4 x 2.3 cm. Normal appearance/no adnexal mass.  Pulsed Doppler evaluation of both ovaries demonstrates normal  low-resistance arterial and venous waveforms bilaterally. The peak  systolic velocity in the right ovary is 11 cm/sec. The peak systolic  velocity in the left ovary is 7 cm/sec.  Other findings  No free fluid.  IMPRESSION:  Probable hemorrhagic follicle in the right ovary. Short-interval  follow up ultrasound in 6-12 weeks is recommended, preferably during  the week following the patient's normal menses. No evidence of  ovarian torsion on either side. Study otherwise unremarkable.  Fallopian tubes are not seen to be dilated in comparison with recent  prior study.  Electronically Signed  By: Bretta Bang M.D.  On: 02/08/2014 21:00  Op notes  CBC    Component  Value  Date/Time    WBC  3.1*  02/28/2014 1502    RBC  4.33  02/28/2014 1502    HGB  12.1  02/28/2014 1502    HCT  35.9*  02/28/2014 1502    PLT  172  02/28/2014 1502    MCV  82.9  02/28/2014 1502    MCH  27.9  02/28/2014 1502    MCHC  33.7  02/28/2014 1502    RDW  13.4  02/28/2014 1502    LYMPHSABS  1.6  02/08/2014 1343    MONOABS  0.4  02/08/2014 1343    EOSABS  0.1  02/08/2014 1343    BASOSABS  0.0  02/08/2014 1343   Assessment  DUB unresponsive to medical management, patient is sterilized  Plan  Offered TVH which is scheduled today. The procedure and the risk of anesthesia, bleeding, infection, bowel and bladder injury,were discussed and her questions were answered.   Erik Nessel  04/12/2014 9:19 AM

## 2014-04-12 NOTE — Anesthesia Postprocedure Evaluation (Signed)
  Anesthesia Post-op Note  Patient: Brenda Blankenship  Procedure(s) Performed: Procedure(s): HYSTERECTOMY VAGINAL (N/A) BILATERAL SALPINGECTOMY (Bilateral)  Patient Location: PACU  Anesthesia Type:General  Level of Consciousness: awake, alert  and oriented  Airway and Oxygen Therapy: Patient Spontanous Breathing  Post-op Pain: mild  Post-op Assessment: Post-op Vital signs reviewed, Patient's Cardiovascular Status Stable, Respiratory Function Stable, Patent Airway, No signs of Nausea or vomiting and Pain level controlled  Post-op Vital Signs: Reviewed and stable  Complications: No apparent anesthesia complications

## 2014-04-12 NOTE — Transfer of Care (Signed)
Immediate Anesthesia Transfer of Care Note  Patient: Brenda Blankenship  Procedure(s) Performed: Procedure(s): HYSTERECTOMY VAGINAL (N/A)  Patient Location: PACU  Anesthesia Type:General  Level of Consciousness: awake, alert  and oriented  Airway & Oxygen Therapy: Patient Spontanous Breathing and Patient connected to nasal cannula oxygen  Post-op Assessment: Report given to PACU RN, Post -op Vital signs reviewed and stable and Patient moving all extremities  Post vital signs: Reviewed and stable  Complications: No apparent anesthesia complications

## 2014-04-13 ENCOUNTER — Encounter (HOSPITAL_COMMUNITY): Payer: Self-pay | Admitting: Obstetrics & Gynecology

## 2014-04-13 DIAGNOSIS — N946 Dysmenorrhea, unspecified: Secondary | ICD-10-CM | POA: Diagnosis not present

## 2014-04-13 MED ORDER — BUTORPHANOL TARTRATE 1 MG/ML IJ SOLN
1.0000 mg | Freq: Once | INTRAMUSCULAR | Status: AC
  Administered 2014-04-13: 1 mg via INTRAMUSCULAR
  Filled 2014-04-13: qty 1

## 2014-04-13 MED ORDER — OXYCODONE-ACETAMINOPHEN 5-325 MG PO TABS: 1.0000 | ORAL_TABLET | ORAL | Status: DC | PRN

## 2014-04-13 MED ORDER — IBUPROFEN 600 MG PO TABS: 600.0000 mg | ORAL_TABLET | Freq: Four times a day (QID) | ORAL | Status: DC | PRN

## 2014-04-13 MED ORDER — KETOROLAC TROMETHAMINE 30 MG/ML IJ SOLN
30.0000 mg | Freq: Once | INTRAMUSCULAR | Status: DC
  Filled 2014-04-13: qty 1

## 2014-04-13 MED ORDER — KETOROLAC TROMETHAMINE 30 MG/ML IJ SOLN: 30.0000 mg | Freq: Once | INTRAMUSCULAR | Status: DC

## 2014-04-13 NOTE — Discharge Summary (Signed)
Physician Discharge Summary  Patient ID: Brenda Blankenship MRN: [id number] DOB/AGE: 1986/02/18 28 y.o.  Admit date: 04/12/2014 Discharge date: 04/13/2014  Admission Diagnoses:dysmenorrhea and menorrhagia  Discharge Diagnoses: same Active Problems:   Postoperative state   Discharged Condition: good  Hospital Course: 28 y.o. [id number] No LMP recorded. Dysmenorrhea and menorrhagia s/p BTL 10/2013, wants definitive therapy  Consults: None  Significant Diagnostic Studies: labs:  CBC    Component Value Date/Time   WBC 3.1* 04/06/2014 1205   RBC 4.48 04/06/2014 1205   HGB 12.3 04/06/2014 1205   HCT 37.3 04/06/2014 1205   PLT 213 04/06/2014 1205   MCV 83.3 04/06/2014 1205   MCH 27.5 04/06/2014 1205   MCHC 33.0 04/06/2014 1205   RDW 13.2 04/06/2014 1205   LYMPHSABS 1.6 02/08/2014 1343   MONOABS 0.4 02/08/2014 1343   EOSABS 0.1 02/08/2014 1343   BASOSABS 0.0 02/08/2014 1343      Treatments: surgery: TVH, bilateral salpingectomy  Discharge Exam: Blood pressure 138/68, pulse 52, temperature 98.1 F (36.7 C), temperature source Oral, resp. rate 16, height 5' 3.5" (1.613 m), weight 128 lb (58.06 kg), SpO2 100.00%, not currently breastfeeding. General appearance: alert, cooperative and no distress GI: soft, non-tender; bowel sounds normal; no masses,  no organomegaly  Disposition: 01-Home or Self Care     Medication List    STOP taking these medications       medroxyPROGESTERone 10 MG tablet  Commonly known as:  PROVERA      TAKE these medications       diphenhydrAMINE 25 mg capsule  Commonly known as:  BENADRYL  Take 25 mg by mouth every 6 (six) hours as needed for allergies.     hydrochlorothiazide 25 MG tablet  Commonly known as:  HYDRODIURIL  Take 25 mg by mouth daily.     ibuprofen 600 MG tablet  Commonly known as:  ADVIL,MOTRIN  Take 1 tablet (600 mg total) by mouth every 6 (six) hours as needed for mild pain.     oxyCODONE-acetaminophen 5-325 MG per tablet  Commonly  known as:  PERCOCET/ROXICET  Take 0.5 tablets by mouth every 6 (six) hours as needed for severe pain.     oxyCODONE-acetaminophen 5-325 MG per tablet  Commonly known as:  PERCOCET/ROXICET  Take 1-2 tablets by mouth every 4 (four) hours as needed for severe pain (moderate to severe pain (when tolerating fluids)).           Follow-up Information   Follow up with [facility] In 4 weeks.   Contact information:   [address] [place] [address] [phone]       Signed: Scheryl Darter 04/13/2014, 12:37 PM

## 2014-04-13 NOTE — Addendum Note (Signed)
Addendum created 04/13/14 0802 by Algis Greenhouse, CRNA   Modules edited: Notes Section   Notes Section:  File: [id number]

## 2014-04-13 NOTE — Progress Notes (Signed)
UR chart review completed.  

## 2014-04-13 NOTE — Progress Notes (Signed)
Pt is discharged in the care of friend,with N.T. Escort. Denies any further abdominal pain. Spirits are good. Denies vaginal   Bleeding. Stable. Discharge instructions with Rx were given to pt. Questions asked and answered No equipment ed for home use.Marland Kitchen

## 2014-04-13 NOTE — Discharge Instructions (Signed)
Hysterectomy Information  A hysterectomy is a surgery in which your uterus is removed. This surgery may be done to treat various medical problems. After the surgery, you will no longer have menstrual periods. The surgery will also make you unable to become pregnant (sterile). The fallopian tubes and ovaries can be removed (bilateral salpingo-oophorectomy) during this surgery as well.  REASONS FOR A HYSTERECTOMY  Persistent, abnormal bleeding.  Lasting (chronic) pelvic pain or infection.  The lining of the uterus (endometrium) starts growing outside the uterus (endometriosis).  The endometrium starts growing in the muscle of the uterus (adenomyosis).  The uterus falls down into the vagina (pelvic organ prolapse).  Noncancerous growths in the uterus (uterine fibroids) that cause symptoms.  Precancerous cells.  Cervical cancer or uterine cancer. TYPES OF HYSTERECTOMIES  Supracervical hysterectomy--In this type, the top part of the uterus is removed, but not the cervix.  Total hysterectomy--The uterus and cervix are removed.  Radical hysterectomy--The uterus, the cervix, and the fibrous tissue that holds the uterus in place in the pelvis (parametrium) are removed. WAYS A HYSTERECTOMY CAN BE PERFORMED  Abdominal hysterectomy--A large surgical cut (incision) is made in the abdomen. The uterus is removed through this incision.  Vaginal hysterectomy--An incision is made in the vagina. The uterus is removed through this incision. There are no abdominal incisions.  Conventional laparoscopic hysterectomy--Three or four small incisions are made in the abdomen. A thin, lighted tube with a camera (laparoscope) is inserted into one of the incisions. Other tools are put through the other incisions. The uterus is cut into small pieces. The small pieces are removed through the incisions, or they are removed through the vagina.  Laparoscopically assisted vaginal hysterectomy (LAVH)--Three or four  small incisions are made in the abdomen. Part of the surgery is performed laparoscopically and part vaginally. The uterus is removed through the vagina.  Robot-assisted laparoscopic hysterectomy--A laparoscope and other tools are inserted into 3 or 4 small incisions in the abdomen. A computer-controlled device is used to give the surgeon a 3D image and to help control the surgical instruments. This allows for more precise movements of surgical instruments. The uterus is cut into small pieces and removed through the incisions or removed through the vagina. RISKS AND COMPLICATIONS  Possible complications associated with this procedure include:  Bleeding and risk of blood transfusion. Tell your health care provider if you do not want to receive any blood products.  Blood clots in the legs or lung.  Infection.  Injury to surrounding organs.  Problems or side effects related to anesthesia.  Conversion to an abdominal hysterectomy from one of the other techniques. WHAT TO EXPECT AFTER A HYSTERECTOMY  You will be given pain medicine.  You will need to have someone with you for the first 3-5 days after you go home.  You will need to follow up with your surgeon in 2-4 weeks after surgery to evaluate your progress.  You may have early menopause symptoms such as hot flashes, night sweats, and insomnia.  If you had a hysterectomy for a problem that was not cancer or not a condition that could lead to cancer, then you no longer need Pap tests. However, even if you no longer need a Pap test, a regular exam is a good idea to make sure no other problems are starting. Document Released: 01/06/2001 Document Revised: 05/03/2013 Document Reviewed: 03/20/2013 ExitCare Patient Information 2015 [employer]. This information is not intended to replace advice given to you by your health care   provider. Make sure you discuss any questions you have with your health care provider.  

## 2014-04-13 NOTE — Progress Notes (Signed)
Admission nutrition screen triggered for weight loss > 10 Lbs over the past month. Patients chart reviewed and assessed  for nutritional risk. This pt has had wide swings in documented weights over the past year: 03/10/13 138 lbs 08/04/13   174 lbs 02/14/14  140 Lbs Current weight 128 lbs  Currently at 109% of IBW  Consider recommending Ensure/Boost TID as a meal supplement if appetite poor post op.  Elisabeth Cara M.Odis Luster LDN Neonatal Nutrition Support Specialist/RD III Pager [phone]

## 2014-04-13 NOTE — Anesthesia Postprocedure Evaluation (Signed)
Anesthesia Post Note  Patient: Brenda Blankenship  Procedure(s) Performed: Procedure(s) (LRB): HYSTERECTOMY VAGINAL (N/A) BILATERAL SALPINGECTOMY (Bilateral)  Anesthesia type: General  Patient location: [facility]  Post pain: Pain level controlled  Post assessment: Post-op Vital signs reviewed  Last Vitals:  Filed Vitals:   04/13/14 0539  BP: 138/68  Pulse: 52  Temp: 36.7 C  Resp: 16    Post vital signs: Reviewed  Level of consciousness: sedated  Complications: No apparent anesthesia complications

## 2014-04-16 ENCOUNTER — Telehealth: Payer: Self-pay

## 2014-04-16 NOTE — Telephone Encounter (Signed)
Had surgery last Thursday and she is having nosebleed x 1 and when she blows her nose she has some blood.  When she is coughing she spits up yellowish tint sputum and her legs are still tingling.  She is taking BP medication and medication for pain management.  Pt also informed me that she had a patch behind her to help with nausea and that she vomit x 1 on Saturday the day before she removed the patch on Sunday.  Spoke with Dr. Debroah Loop and he informed me that the body is recovering.  Per Dr. Debroah Loop, it could take at least 1-2 weeks for her symptoms to clear up.  I advised pt that if she continues to have questions to please give the clinics a call and if her sputum is greenish color it can be a sign of infection and to monitor her temp.   Pt stated understanding.

## 2014-04-17 ENCOUNTER — Telehealth: Payer: Self-pay | Admitting: *Deleted

## 2014-04-17 NOTE — Telephone Encounter (Signed)
Patient called and stated that she needs a refill on one of her pain meds. I called patient back and she was concerned that she would soon be out of percocet. I advised patient that she doesn't have to take percocet all the time and that the ibuprofen is a great choice for her pain management. Patient is agreeable to this and will let us know if she has any further questions.

## 2014-04-23 ENCOUNTER — Ambulatory Visit (INDEPENDENT_AMBULATORY_CARE_PROVIDER_SITE_OTHER): Payer: BC Managed Care – PPO | Admitting: General Practice

## 2014-04-23 DIAGNOSIS — R300 Dysuria: Secondary | ICD-10-CM | POA: Diagnosis not present

## 2014-04-23 LAB — POCT URINALYSIS DIP (DEVICE)
Bilirubin Urine: NEGATIVE
Glucose, UA: NEGATIVE mg/dL
Hgb urine dipstick: NEGATIVE
Ketones, ur: NEGATIVE mg/dL
Nitrite: NEGATIVE
Protein, ur: NEGATIVE mg/dL
Specific Gravity, Urine: 1.02 (ref 1.005–1.030)
Urobilinogen, UA: 0.2 mg/dL (ref 0.0–1.0)
pH: 5.5 (ref 5.0–8.0)

## 2014-04-23 MED ORDER — SULFAMETHOXAZOLE-TMP DS 800-160 MG PO TABS: 1.0000 | ORAL_TABLET | Freq: Two times a day (BID) | ORAL | Status: DC

## 2014-04-23 NOTE — Progress Notes (Signed)
Patient came in today to drop off urine sample. States she has had worsening burning with urination, some spotting since surgery, feels like she never empties her bladder all the way and has pain in LLQ and lower back. UA only shows trace leukocytes. Discussed with Nada Maclachlan who ordered bactrim BID x 3 days for UTI symptoms and urine to be sent for culture. Informed patient of antibiotic being sent to pharmacy as well as sending her urine off for culture to see if anything else shows up there. Patient is aware that culture does take several days to process and we will call her if anything comes back positive.

## 2014-04-24 LAB — URINE CULTURE
Colony Count: NO GROWTH
Organism ID, Bacteria: NO GROWTH

## 2014-05-04 ENCOUNTER — Encounter: Payer: Self-pay | Admitting: Obstetrics & Gynecology

## 2014-05-04 ENCOUNTER — Ambulatory Visit (INDEPENDENT_AMBULATORY_CARE_PROVIDER_SITE_OTHER): Payer: BC Managed Care – PPO | Admitting: Obstetrics & Gynecology

## 2014-05-04 VITALS — BP 126/80 | HR 68 | Temp 98.3°F | Ht 63.0 in | Wt 130.8 lb

## 2014-05-04 DIAGNOSIS — Z09 Encounter for follow-up examination after completed treatment for conditions other than malignant neoplasm: Secondary | ICD-10-CM

## 2014-05-04 DIAGNOSIS — Z48816 Encounter for surgical aftercare following surgery on the genitourinary system: Secondary | ICD-10-CM

## 2014-05-04 DIAGNOSIS — R300 Dysuria: Secondary | ICD-10-CM

## 2014-05-04 LAB — POCT URINALYSIS DIP (DEVICE)
Bilirubin Urine: NEGATIVE
Glucose, UA: NEGATIVE mg/dL
Hgb urine dipstick: NEGATIVE
Ketones, ur: NEGATIVE mg/dL
Nitrite: NEGATIVE
Protein, ur: NEGATIVE mg/dL
Specific Gravity, Urine: 1.025 (ref 1.005–1.030)
Urobilinogen, UA: 0.2 mg/dL (ref 0.0–1.0)
pH: 5.5 (ref 5.0–8.0)

## 2014-05-04 MED ORDER — CIPROFLOXACIN HCL 500 MG PO TABS: 500.0000 mg | ORAL_TABLET | Freq: Two times a day (BID) | ORAL | Status: DC

## 2014-05-04 MED ORDER — PHENAZOPYRIDINE HCL 200 MG PO TABS: 200.0000 mg | ORAL_TABLET | Freq: Three times a day (TID) | ORAL | Status: DC | PRN

## 2014-05-04 MED ORDER — IBUPROFEN 600 MG PO TABS: 600.0000 mg | ORAL_TABLET | Freq: Four times a day (QID) | ORAL | Status: DC | PRN

## 2014-05-04 NOTE — Progress Notes (Signed)
Patient ID: Brenda Blankenship, female   DOB: 03-28-1986, 28 y.o.   MRN: [id number] Subjective: TVH 9/17     Brenda Blankenship is a 28 y.o. female who presents to the clinic 3 weeks status post vaginal hysterectomy for abnormal uterine bleeding. Eating a regular diet without difficulty. Bowel movements are normal. Pain is controlled with current analgesics. Medications being used: prescription NSAID's including ibuprofen 600 mg .  The following portions of the patient's history were reviewed and updated as appropriate: allergies, current medications, past family history, past medical history, past social history, past surgical history and problem list.  Review of Systems Genitourinary:positive for dysuria    Objective:    BP 126/80  Pulse 68  Temp(Src) 98.3 F (36.8 C) (Oral)  Ht 5\' 3"  (1.6 m)  Wt 130 lb 12.8 oz (59.33 kg)  BMI 23.18 kg/m2  LMP 03/09/2014  Breastfeeding? No General:  alert, cooperative and no distress  Abdomen: soft, bowel sounds active, non-tender  Incision:   n/a      Assessment:    Postoperative course complicated by dysuria and mild s/p pain Operative findings again reviewed. Pathology report discussed.    Plan:    1. Continue any current medications. 2. Wound care discussed. 3. Activity restrictions: no lifting more than 25 pounds 4. Anticipated return to work: 1-2 weeks. 5. Follow up prn. Check UA and use Pyridium 200 mg TID prn, 20 tab   Adam PhenixJames G Arnold, MD 05/04/2014

## 2014-05-04 NOTE — Progress Notes (Signed)
Brenda Blankenship had already left , Dr. Debroah LoopArnold ordered pyridium. Called Brenda Blankenship and notified her pyridium sent to her pharmacy for pain assoctiated with probable UTI and that we have sent urine for culture. Will call her when we get results and notify her of  Antibiotic needed.

## 2014-05-04 NOTE — Patient Instructions (Signed)
Abdominal Hysterectomy, Care After Refer to this sheet in the next few weeks. These instructions provide you with information on caring for yourself after your procedure. Your health care provider may also give you more specific instructions. Your treatment has been planned according to current medical practices, but problems sometimes occur. Call your health care provider if you have any problems or questions after your procedure.  WHAT TO EXPECT AFTER THE PROCEDURE After your procedure, it is typical to have the following:  Pain.  Feeling tired.  Poor appetite.  Less interest in sex. HOME CARE INSTRUCTIONS  It takes 4-6 weeks to recover from this surgery. Make sure you follow all your health care provider's instructions. Home care instructions may include:  Take pain medicines only as directed by your health care provider. Do not take over-the-counter pain medicines without checking with your health care provider first.  Change your bandage as directed by your health care provider.  Return to your health care provider to have your sutures taken out.  Take showers instead of baths for 2-3 weeks. Ask your health care provider when it is safe to start showering.  Do not douche, use tampons, or have sexual intercourse for at least 6 weeks or until your health care provider says you can.   Follow your health care provider's advice about exercise, lifting, driving, and general activities.  Get plenty of rest and sleep.   Do not lift anything heavier than a gallon of milk (about 10 lb [4.5 kg]) for the first month after surgery.  You can resume your normal diet if your health care provider says it is okay.   Do not drink alcohol until your health care provider says you can.   If you are constipated, ask your health care provider if you can take a mild laxative.  Eating foods high in fiber may also help with constipation. Eat plenty of raw fruits and vegetables, whole grains, and  beans.  Drink enough fluids to keep your urine clear or pale yellow.   Try to have someone at home with you for the first 1-2 weeks to help around the house.  Keep all follow-up appointments. SEEK MEDICAL CARE IF:   You have chills or fever.  You have swelling, redness, or pain in the area of your incision that is getting worse.   You have pus coming from the incision.   You notice a bad smell coming from the incision or bandage.   Your incision breaks open.   You feel dizzy or light-headed.   You have pain or bleeding when you urinate.   You have persistent diarrhea.   You have persistent nausea and vomiting.   You have abnormal vaginal discharge.   You have a rash.   You have any type of abnormal reaction or develop an allergy to your medicine.   Your pain medicine is not helping.  SEEK IMMEDIATE MEDICAL CARE IF:   You have a fever and your symptoms suddenly get worse.  You have severe abdominal pain.  You have chest pain.  You have shortness of breath.  You faint.  You have pain, swelling, or redness of your leg.  You have heavy vaginal bleeding with blood clots. MAKE SURE YOU:  Understand these instructions.  Will watch your condition.  Will get help right away if you are not doing well or get worse. Document Released: 01/30/2005 Document Revised: 07/18/2013 Document Reviewed: 05/05/2013 ExitCare Patient Information 2015 [employer]. This information is not intended   to replace advice given to you by your health care provider. Make sure you discuss any questions you have with your health care provider.  

## 2014-05-06 LAB — URINE CULTURE: Colony Count: 50000

## 2014-05-28 ENCOUNTER — Encounter: Payer: Self-pay | Admitting: Obstetrics & Gynecology

## 2014-05-30 ENCOUNTER — Encounter: Payer: Self-pay | Admitting: *Deleted

## 2014-07-25 ENCOUNTER — Encounter (HOSPITAL_COMMUNITY): Payer: Self-pay | Admitting: *Deleted

## 2014-07-25 ENCOUNTER — Emergency Department (HOSPITAL_COMMUNITY)
Admission: EM | Admit: 2014-07-25 | Discharge: 2014-07-25 | Disposition: A | Discharge status: Home / Self Care | Payer: BC Managed Care – PPO | Attending: Emergency Medicine | Admitting: Emergency Medicine

## 2014-07-25 ENCOUNTER — Telehealth: Payer: Self-pay | Admitting: *Deleted

## 2014-07-25 DIAGNOSIS — Z720 Tobacco use: Secondary | ICD-10-CM | POA: Diagnosis not present

## 2014-07-25 DIAGNOSIS — Z792 Long term (current) use of antibiotics: Secondary | ICD-10-CM | POA: Insufficient documentation

## 2014-07-25 DIAGNOSIS — N898 Other specified noninflammatory disorders of vagina: Secondary | ICD-10-CM | POA: Diagnosis not present

## 2014-07-25 DIAGNOSIS — Z3202 Encounter for pregnancy test, result negative: Secondary | ICD-10-CM | POA: Insufficient documentation

## 2014-07-25 DIAGNOSIS — Z862 Personal history of diseases of the blood and blood-forming organs and certain disorders involving the immune mechanism: Secondary | ICD-10-CM | POA: Insufficient documentation

## 2014-07-25 DIAGNOSIS — B86 Scabies: Secondary | ICD-10-CM | POA: Diagnosis not present

## 2014-07-25 DIAGNOSIS — R300 Dysuria: Secondary | ICD-10-CM | POA: Diagnosis not present

## 2014-07-25 DIAGNOSIS — R21 Rash and other nonspecific skin eruption: Secondary | ICD-10-CM | POA: Diagnosis present

## 2014-07-25 LAB — URINALYSIS, ROUTINE W REFLEX MICROSCOPIC
Bilirubin Urine: NEGATIVE
Glucose, UA: NEGATIVE mg/dL
Hgb urine dipstick: NEGATIVE
Ketones, ur: NEGATIVE mg/dL
Leukocytes, UA: NEGATIVE
Nitrite: NEGATIVE
Protein, ur: NEGATIVE mg/dL
Specific Gravity, Urine: 1.017 (ref 1.005–1.030)
Urobilinogen, UA: 0.2 mg/dL (ref 0.0–1.0)
pH: 5.5 (ref 5.0–8.0)

## 2014-07-25 LAB — POC URINE PREG, ED: Preg Test, Ur: NEGATIVE

## 2014-07-25 MED ORDER — HYDROXYZINE HCL 10 MG PO TABS: 10.0000 mg | ORAL_TABLET | Freq: Four times a day (QID) | ORAL | Status: DC | PRN

## 2014-07-25 MED ORDER — HYDROXYZINE HCL 10 MG PO TABS
10.0000 mg | ORAL_TABLET | Freq: Once | ORAL | Status: AC
  Administered 2014-07-25: 10 mg via ORAL
  Filled 2014-07-25: qty 1

## 2014-07-25 MED ORDER — DIPHENHYDRAMINE HCL 25 MG PO CAPS
25.0000 mg | ORAL_CAPSULE | Freq: Once | ORAL | Status: AC
  Administered 2014-07-25: 25 mg via ORAL
  Filled 2014-07-25: qty 1

## 2014-07-25 MED ORDER — PERMETHRIN 5 % EX CREA: TOPICAL_CREAM | CUTANEOUS | Status: DC

## 2014-07-25 NOTE — Telephone Encounter (Addendum)
Pt called and stated that she was treated for UTI a couple weeks ago. She is still having painful urination and also has pain in her abdomen and back. The abdominal and back pain feel like labor contractions. I returned pt's call and discussed her concern. I stated that her previous problem was evaluated on 10/9 and her urine culture was actually negative for bladder infection. I advised pt to go to Urgent Care or ED for complete evaluation. Pt voiced understanding.

## 2014-07-25 NOTE — ED Provider Notes (Signed)
CSN: [id number]     Arrival date & time 07/25/14  2130 History   First MD Initiated Contact with Patient 07/25/14 2214     Chief Complaint  Patient presents with  . Rash  . Urinary Tract Infection     (Consider location/radiation/quality/duration/timing/severity/associated sxs/prior Treatment) HPI Comments: Patient presents emergency department with chief complaint of dysuria. She also reports having some new vaginal discharge. She also complains of a diffuse rash.  She states that her daughter was recently diagnosed with scabies.  She denies any associated fevers or chills. Denies any nausea or vomiting. She has tried taking Benadryl with no relief for the rash and itching. There are no aggravating factors.  The history is provided by the patient. No language interpreter was used.    Past Medical History  Diagnosis Date  . Environmental allergies   . Preterm labor   . Anemia   . Abnormal cervical Papanicolaou smear     cryo  . Pregnancy induced hypertension   . DUB (dysfunctional uterine bleeding)    Past Surgical History  Procedure Laterality Date  . Cryotherapy    . Tubal ligation    . Laparoscopic tubal ligation Bilateral 11/10/2013    Procedure: bilateral LAPAROSCOPIC TUBAL LIGATION with filshie clips;  Surgeon: Freddrick MarchKendra H. Tenny Crawoss, MD;  Location: [facility];  Service: Gynecology;  Laterality: Bilateral;  . Vaginal hysterectomy N/A 04/12/2014    Procedure: HYSTERECTOMY VAGINAL;  Surgeon: Adam PhenixJames G Arnold, MD;  Location: [facility];  Service: Gynecology;  Laterality: N/A;  . Bilateral salpingectomy Bilateral 04/12/2014    Procedure: BILATERAL SALPINGECTOMY;  Surgeon: Adam PhenixJames G Arnold, MD;  Location: [facility];  Service: Gynecology;  Laterality: Bilateral;   Family History  Problem Relation Age of Onset  . Hypertension Mother   . Hypertension Father   . Hyperlipidemia Father   . Diabetes Maternal Grandmother   . Cancer Paternal Grandmother     started in lung  . Hypertension Paternal  Grandmother    History  Substance Use Topics  . Smoking status: Current Every Day Smoker -- 0.25 packs/day for 5 years    Types: Cigarettes  . Smokeless tobacco: Never Used     Comment: stopped with preg  . Alcohol Use: No   OB History    Gravida Para Term Preterm AB TAB SAB Ectopic Multiple Living   3 2 2  0 1 1 0 0 0 2     Review of Systems  Constitutional: Negative for fever and chills.  Respiratory: Negative for shortness of breath.   Cardiovascular: Negative for chest pain.  Gastrointestinal: Negative for nausea, vomiting, diarrhea and constipation.  Genitourinary: Positive for dysuria and vaginal discharge.  All other systems reviewed and are negative.     Allergies  Hydrocodone and Naprosyn  Home Medications   Prior to Admission medications   Medication Sig Start Date End Date Taking? Authorizing Provider  diphenhydrAMINE (BENADRYL) 25 mg capsule Take 25 mg by mouth every 6 (six) hours as needed for allergies.   Yes Historical Provider, MD  ibuprofen (ADVIL,MOTRIN) 600 MG tablet Take 1 tablet (600 mg total) by mouth every 6 (six) hours as needed for mild pain. 05/04/14  Yes Adam PhenixJames G Arnold, MD  ciprofloxacin (CIPRO) 500 MG tablet Take 1 tablet (500 mg total) by mouth 2 (two) times daily. Patient not taking: Reported on 07/25/2014 05/04/14   Adam PhenixJames G Arnold, MD  hydrOXYzine (ATARAX/VISTARIL) 10 MG tablet Take 1 tablet (10 mg total) by mouth every 6 (six) hours as needed  for itching. 07/25/14   Roxy Horsemanobert Manali Mcelmurry, PA-C  permethrin (ELIMITE) 5 % cream Apply to entire body other than face - let sit for 14 hours then wash off, may repeat in 1 week if still having symptoms 07/25/14   Roxy Horsemanobert Sanyla Summey, PA-C  phenazopyridine (PYRIDIUM) 200 MG tablet Take 1 tablet (200 mg total) by mouth 3 (three) times daily as needed for pain. Patient not taking: Reported on 07/25/2014 05/04/14 05/04/15  Adam PhenixJames G Arnold, MD   BP 135/80 mmHg  Pulse 83  Temp(Src) 98.6 F (37 C) (Oral)  Resp 14  SpO2  100%  LMP 03/09/2014 Physical Exam  Constitutional: She is oriented to person, place, and time. She appears well-developed and well-nourished.  HENT:  Head: Normocephalic and atraumatic.  Eyes: Conjunctivae and EOM are normal. Pupils are equal, round, and reactive to light.  Neck: Normal range of motion. Neck supple.  Cardiovascular: Normal rate and regular rhythm.  Exam reveals no gallop and no friction rub.   No murmur heard. Pulmonary/Chest: Effort normal and breath sounds normal. No respiratory distress. She has no wheezes. She has no rales. She exhibits no tenderness.  Abdominal: Soft. Bowel sounds are normal. She exhibits no distension and no mass. There is no tenderness. There is no rebound and no guarding.  Genitourinary:  Pelvic exam refused  Musculoskeletal: Normal range of motion. She exhibits no edema or tenderness.  Neurological: She is alert and oriented to person, place, and time.  Skin: Skin is warm and dry.  Scattered bug bites to upper and lower extremities and torso  Psychiatric: She has a normal mood and affect. Her behavior is normal. Judgment and thought content normal.  Nursing note and vitals reviewed.   ED Course  Procedures (including critical care time) Results for orders placed or performed during the hospital encounter of 07/25/14  Urinalysis, Routine w reflex microscopic  Result Value Ref Range   Color, Urine YELLOW YELLOW   APPearance CLEAR CLEAR   Specific Gravity, Urine 1.017 1.005 - 1.030   pH 5.5 5.0 - 8.0   Glucose, UA NEGATIVE NEGATIVE mg/dL   Hgb urine dipstick NEGATIVE NEGATIVE   Bilirubin Urine NEGATIVE NEGATIVE   Ketones, ur NEGATIVE NEGATIVE mg/dL   Protein, ur NEGATIVE NEGATIVE mg/dL   Urobilinogen, UA 0.2 0.0 - 1.0 mg/dL   Nitrite NEGATIVE NEGATIVE   Leukocytes, UA NEGATIVE NEGATIVE  POC Urine Pregnancy, ED ([facility])  Result Value Ref Range   Preg Test, Ur NEGATIVE NEGATIVE   No results found.   Imaging Review No  results found.   EKG Interpretation None      MDM   Final diagnoses:  Scabies  Dysuria    Patient with multiple complaints. She complains of dysuria, which she believes the urinary tract infection. Urinalysis is unremarkable to stay. Will send urine for culture. Patient does report having some vaginal discharge, however she declines pelvic exam and wet prep exam today. She states that she'll follow-up with her OB/GYN, and does not want to have these test done today. Additionally, I will treat her for scabies, give her permethrin, Atarax, and recommend continuing Benadryl. Patient understands and agrees with the plan. She is stable and ready for discharge.   Roxy Horsemanobert Lexandra Rettke, PA-C 07/25/14 2328  Elwin MochaBlair Walden, MD 07/26/14 [phone]

## 2014-07-25 NOTE — Discharge Instructions (Signed)
Scabies Scabies are small bugs (mites) that burrow under the skin and cause red bumps and severe itching. These bugs can only be seen with a microscope. Scabies are highly contagious. They can spread easily from person to person by direct contact. They are also spread through sharing clothing or linens that have the scabies mites living in them. It is not unusual for an entire family to become infected through shared towels, clothing, or bedding.  HOME CARE INSTRUCTIONS   Your caregiver may prescribe a cream or lotion to kill the mites. If cream is prescribed, massage the cream into the entire body from the neck to the bottom of both feet. Also massage the cream into the scalp and face if your child is less than 28 year old. Avoid the eyes and mouth. Do not wash your hands after application.  Leave the cream on for 8 to 12 hours. Your child should bathe or shower after the 8 to 12 hour application period. Sometimes it is helpful to apply the cream to your child right before bedtime.  One treatment is usually effective and will eliminate approximately 95% of infestations. For severe cases, your caregiver may decide to repeat the treatment in 1 week. Everyone in your household should be treated with one application of the cream.  New rashes or burrows should not appear within 24 to 48 hours after successful treatment. However, the itching and rash may last for 2 to 4 weeks after successful treatment. Your caregiver may prescribe a medicine to help with the itching or to help the rash go away more quickly.  Scabies can live on clothing or linens for up to 3 days. All of your child's recently used clothing, towels, stuffed toys, and bed linens should be washed in hot water and then dried in a dryer for at least 20 minutes on high heat. Items that cannot be washed should be enclosed in a plastic bag for at least 3 days.  To help relieve itching, bathe your child in a cool bath or apply cool washcloths to the  affected areas.  Your child may return to school after treatment with the prescribed cream. SEEK MEDICAL CARE IF:   The itching persists longer than 4 weeks after treatment.  The rash spreads or becomes infected. Signs of infection include red blisters or yellow-tan crust. Document Released: 07/13/2005 Document Revised: 10/05/2011 Document Reviewed: 11/21/2008 [facility] Patient Information 2015 [employer], [place]. This information is not intended to replace advice given to you by your health care provider. Make sure you discuss any questions you have with your health care provider.  Dysuria Dysuria is the medical term for pain with urination. There are many causes for dysuria, but urinary tract infection is the most common. If a urinalysis was performed it can show that there is a urinary tract infection. A urine culture confirms that you or your child is sick. You will need to follow up with a healthcare provider because:  If a urine culture was done you will need to know the culture results and treatment recommendations.  If the urine culture was positive, you or your child will need to be put on antibiotics or know if the antibiotics prescribed are the right antibiotics for your urinary tract infection.  If the urine culture is negative (no urinary tract infection), then other causes may need to be explored or antibiotics need to be stopped. Today laboratory work may have been done and there does not seem to be an infection. If  cultures were done they will take at least 24 to 48 hours to be completed. Today x-rays may have been taken and they read as normal. No cause can be found for the problems. The x-rays may be re-read by a radiologist and you will be contacted if additional findings are made. You or your child may have been put on medications to help with this problem until you can see your primary caregiver. If the problems get better, see your primary caregiver if the problems return. If  you were given antibiotics (medications which kill germs), take all of the mediations as directed for the full course of treatment.  If laboratory work was done, you need to find the results. Leave a telephone number where you can be reached. If this is not possible, make sure you find out how you are to get test results. HOME CARE INSTRUCTIONS   Drink lots of fluids. For adults, drink eight, 8 ounce glasses of clear juice or water a day. For children, replace fluids as suggested by your caregiver.  Empty the bladder often. Avoid holding urine for long periods of time.  After a bowel movement, women should cleanse front to back, using each tissue only once.  Empty your bladder before and after sexual intercourse.  Take all the medicine given to you until it is gone. You may feel better in a few days, but TAKE ALL MEDICINE.  Avoid caffeine, tea, alcohol and carbonated beverages, because they tend to irritate the bladder.  In men, alcohol may irritate the prostate.  Only take over-the-counter or prescription medicines for pain, discomfort, or fever as directed by your caregiver.  If your caregiver has given you a follow-up appointment, it is very important to keep that appointment. Not keeping the appointment could result in a chronic or permanent injury, pain, and disability. If there is any problem keeping the appointment, you must call back to this facility for assistance. SEEK IMMEDIATE MEDICAL CARE IF:   Back pain develops.  A fever develops.  There is nausea (feeling sick to your stomach) or vomiting (throwing up).  Problems are no better with medications or are getting worse. MAKE SURE YOU:   Understand these instructions.  Will watch your condition.  Will get help right away if you are not doing well or get worse. Document Released: 04/10/2004 Document Revised: 10/05/2011 Document Reviewed: 02/16/2008 [facility]Exit[facility] Patient Information 2015 [place], [place]. This information is  not intended to replace advice given to you by your health care provider. Make sure you discuss any questions you have with your health care provider.

## 2014-07-25 NOTE — ED Notes (Signed)
Pt c/o rash all over body and uti. Pt states her daughter caught scabies from day care.

## 2014-07-25 NOTE — ED Notes (Signed)
Patient states that "her arms were just breaking out at first but now her face is breaking out too"

## 2014-07-29 LAB — URINE CULTURE: Colony Count: 40000

## 2014-09-14 ENCOUNTER — Encounter (HOSPITAL_COMMUNITY): Payer: Self-pay | Admitting: *Deleted

## 2014-09-14 ENCOUNTER — Emergency Department (HOSPITAL_COMMUNITY): Payer: BLUE CROSS/BLUE SHIELD

## 2014-09-14 ENCOUNTER — Emergency Department (HOSPITAL_COMMUNITY)
Admission: EM | Admit: 2014-09-14 | Discharge: 2014-09-14 | Disposition: A | Discharge status: Home / Self Care | Payer: BLUE CROSS/BLUE SHIELD | Attending: Emergency Medicine | Admitting: Emergency Medicine

## 2014-09-14 DIAGNOSIS — J069 Acute upper respiratory infection, unspecified: Secondary | ICD-10-CM | POA: Insufficient documentation

## 2014-09-14 DIAGNOSIS — Z792 Long term (current) use of antibiotics: Secondary | ICD-10-CM | POA: Insufficient documentation

## 2014-09-14 DIAGNOSIS — Z79899 Other long term (current) drug therapy: Secondary | ICD-10-CM | POA: Diagnosis not present

## 2014-09-14 DIAGNOSIS — Z862 Personal history of diseases of the blood and blood-forming organs and certain disorders involving the immune mechanism: Secondary | ICD-10-CM | POA: Insufficient documentation

## 2014-09-14 DIAGNOSIS — H9201 Otalgia, right ear: Secondary | ICD-10-CM | POA: Insufficient documentation

## 2014-09-14 DIAGNOSIS — Z8751 Personal history of pre-term labor: Secondary | ICD-10-CM | POA: Diagnosis not present

## 2014-09-14 DIAGNOSIS — Z720 Tobacco use: Secondary | ICD-10-CM | POA: Diagnosis not present

## 2014-09-14 DIAGNOSIS — Z8679 Personal history of other diseases of the circulatory system: Secondary | ICD-10-CM | POA: Diagnosis not present

## 2014-09-14 DIAGNOSIS — Z8742 Personal history of other diseases of the female genital tract: Secondary | ICD-10-CM | POA: Insufficient documentation

## 2014-09-14 DIAGNOSIS — J029 Acute pharyngitis, unspecified: Secondary | ICD-10-CM | POA: Diagnosis present

## 2014-09-14 LAB — COMPREHENSIVE METABOLIC PANEL
ALT: 14 U/L (ref 0–35)
AST: 43 U/L — ABNORMAL HIGH (ref 0–37)
Albumin: 3.9 g/dL (ref 3.5–5.2)
Alkaline Phosphatase: 38 U/L — ABNORMAL LOW (ref 39–117)
Anion gap: 6 (ref 5–15)
BUN: 6 mg/dL (ref 6–23)
CO2: 25 mmol/L (ref 19–32)
Calcium: 9 mg/dL (ref 8.4–10.5)
Chloride: 102 mmol/L (ref 96–112)
Creatinine, Ser: 0.91 mg/dL (ref 0.50–1.10)
GFR calc Af Amer: >90 mL/min (ref >90)
GFR calc non Af Amer: 85 mL/min — ABNORMAL LOW (ref >90)
Glucose, Bld: 99 mg/dL (ref 70–99)
Potassium: 5 mmol/L (ref 3.5–5.1)
Sodium: 133 mmol/L — ABNORMAL LOW (ref 135–145)
Total Bilirubin: 2.2 mg/dL — ABNORMAL HIGH (ref 0.3–1.2)
Total Protein: 6.2 g/dL (ref 6.0–8.3)

## 2014-09-14 LAB — CBC WITH DIFFERENTIAL/PLATELET
Basophils Absolute: 0 10*3/uL (ref 0.0–0.1)
Basophils Relative: 1 % (ref 0–1)
Eosinophils Absolute: 0 10*3/uL (ref 0.0–0.7)
Eosinophils Relative: 1 % (ref 0–5)
HCT: 37.6 % (ref 36.0–46.0)
Hemoglobin: 12.6 g/dL (ref 12.0–15.0)
Lymphocytes Relative: 50 % — ABNORMAL HIGH (ref 12–46)
Lymphs Abs: 1.7 10*3/uL (ref 0.7–4.0)
MCH: 27.7 pg (ref 26.0–34.0)
MCHC: 33.5 g/dL (ref 30.0–36.0)
MCV: 82.6 fL (ref 78.0–100.0)
Monocytes Absolute: 0.4 10*3/uL (ref 0.1–1.0)
Monocytes Relative: 11 % (ref 3–12)
Neutro Abs: 1.3 10*3/uL — ABNORMAL LOW (ref 1.7–7.7)
Neutrophils Relative %: 37 % — ABNORMAL LOW (ref 43–77)
Platelets: 176 10*3/uL (ref 150–400)
RBC: 4.55 MIL/uL (ref 3.87–5.11)
RDW: 13 % (ref 11.5–15.5)
WBC: 3.4 10*3/uL — ABNORMAL LOW (ref 4.0–10.5)

## 2014-09-14 LAB — I-STAT TROPONIN, ED: Troponin i, poc: 0 ng/mL (ref 0.00–0.08)

## 2014-09-14 LAB — D-DIMER, QUANTITATIVE: D-Dimer, Quant: <0.27 ug/mL-FEU (ref 0.00–0.48)

## 2014-09-14 LAB — RAPID STREP SCREEN (MED CTR MEBANE ONLY): Streptococcus, Group A Screen (Direct): NEGATIVE

## 2014-09-14 MED ORDER — OXYCODONE-ACETAMINOPHEN 5-325 MG PO TABS: 1.0000 | ORAL_TABLET | Freq: Four times a day (QID) | ORAL | Status: DC | PRN

## 2014-09-14 MED ORDER — OXYCODONE-ACETAMINOPHEN 5-325 MG PO TABS
1.0000 | ORAL_TABLET | Freq: Once | ORAL | Status: AC
  Administered 2014-09-14: 1 via ORAL
  Filled 2014-09-14: qty 1

## 2014-09-14 NOTE — ED Notes (Signed)
Patient to move to E43 once it is vacant.

## 2014-09-14 NOTE — ED Provider Notes (Signed)
CSN: [id number]     Arrival date & time 09/14/14  1354 History   First MD Initiated Contact with Patient 09/14/14 1415     Chief Complaint  Patient presents with  . Sore Throat  . Otalgia  . Fever     (Consider location/radiation/quality/duration/timing/severity/associated sxs/prior Treatment) Patient is a 29 y.o. female presenting with pharyngitis, ear pain, fever, and chest pain. The history is provided by the patient.  Sore Throat This is a new problem. The current episode started more than 2 days ago. The problem occurs constantly. The problem has not changed since onset.Pertinent negatives include no chest pain, no abdominal pain, no headaches and no shortness of breath. Nothing aggravates the symptoms. Nothing relieves the symptoms. Treatments tried: amoxil. The treatment provided no relief.  Otalgia Associated symptoms: congestion, fever, rhinorrhea and sore throat   Associated symptoms: no abdominal pain, no cough, no diarrhea, no headaches, no neck pain and no vomiting   Fever Associated symptoms: congestion, ear pain, rhinorrhea and sore throat   Associated symptoms: no chest pain, no cough, no diarrhea, no dysuria, no headaches, no nausea and no vomiting   Chest Pain Pain location:  Substernal area Pain quality comment:  Heaviness Pain radiates to:  Does not radiate Pain radiates to the back: no   Pain severity:  Mild Onset quality:  Gradual Duration:  2 days Timing:  Constant Progression:  Unchanged Chronicity:  Recurrent Context: at rest   Relieved by:  Nothing Worsened by:  Nothing tried Ineffective treatments:  None tried Associated symptoms: fever   Associated symptoms: no abdominal pain, no back pain, no cough, no dizziness, no dysphagia, no fatigue, no headache, no nausea, no shortness of breath and not vomiting     Past Medical History  Diagnosis Date  . Environmental allergies   . Preterm labor   . Anemia   . Abnormal cervical Papanicolaou smear      cryo  . Pregnancy induced hypertension   . DUB (dysfunctional uterine bleeding)    Past Surgical History  Procedure Laterality Date  . Cryotherapy    . Tubal ligation    . Laparoscopic tubal ligation Bilateral 11/10/2013    Procedure: bilateral LAPAROSCOPIC TUBAL LIGATION with filshie clips;  Surgeon: Freddrick MarchKendra H. Tenny Crawoss, MD;  Location: [facility];  Service: Gynecology;  Laterality: Bilateral;  . Vaginal hysterectomy N/A 04/12/2014    Procedure: HYSTERECTOMY VAGINAL;  Surgeon: Adam PhenixJames G Arnold, MD;  Location: [facility];  Service: Gynecology;  Laterality: N/A;  . Bilateral salpingectomy Bilateral 04/12/2014    Procedure: BILATERAL SALPINGECTOMY;  Surgeon: Adam PhenixJames G Arnold, MD;  Location: [facility];  Service: Gynecology;  Laterality: Bilateral;   Family History  Problem Relation Age of Onset  . Hypertension Mother   . Hypertension Father   . Hyperlipidemia Father   . Diabetes Maternal Grandmother   . Cancer Paternal Grandmother     started in lung  . Hypertension Paternal Grandmother    History  Substance Use Topics  . Smoking status: Current Every Day Smoker -- 0.25 packs/day for 5 years    Types: Cigarettes  . Smokeless tobacco: Never Used     Comment: stopped with preg  . Alcohol Use: No   OB History    Gravida Para Term Preterm AB TAB SAB Ectopic Multiple Living   3 2 2  0 1 1 0 0 0 2     Review of Systems  Constitutional: Positive for fever. Negative for fatigue.  HENT: Positive for congestion, ear pain, rhinorrhea  and sore throat. Negative for drooling, trouble swallowing and voice change.   Eyes: Negative for pain.  Respiratory: Negative for cough and shortness of breath.   Cardiovascular: Negative for chest pain.  Gastrointestinal: Negative for nausea, vomiting, abdominal pain and diarrhea.  Genitourinary: Negative for dysuria and hematuria.  Musculoskeletal: Negative for back pain, gait problem and neck pain.  Skin: Negative for color change.  Neurological: Negative for dizziness and  headaches.  Hematological: Negative for adenopathy.  Psychiatric/Behavioral: Negative for behavioral problems.  All other systems reviewed and are negative.     Allergies  Hydrocodone and Naprosyn  Home Medications   Prior to Admission medications   Medication Sig Start Date End Date Taking? Authorizing Provider  ciprofloxacin (CIPRO) 500 MG tablet Take 1 tablet (500 mg total) by mouth 2 (two) times daily. Patient not taking: Reported on 07/25/2014 05/04/14   Adam Phenix, MD  diphenhydrAMINE (BENADRYL) 25 mg capsule Take 25 mg by mouth every 6 (six) hours as needed for allergies.    Historical Provider, MD  hydrOXYzine (ATARAX/VISTARIL) 10 MG tablet Take 1 tablet (10 mg total) by mouth every 6 (six) hours as needed for itching. 07/25/14   Roxy Horseman, PA-C  ibuprofen (ADVIL,MOTRIN) 600 MG tablet Take 1 tablet (600 mg total) by mouth every 6 (six) hours as needed for mild pain. 05/04/14   Adam Phenix, MD  permethrin (ELIMITE) 5 % cream Apply to entire body other than face - let sit for 14 hours then wash off, may repeat in 1 week if still having symptoms 07/25/14   Roxy Horseman, PA-C  phenazopyridine (PYRIDIUM) 200 MG tablet Take 1 tablet (200 mg total) by mouth 3 (three) times daily as needed for pain. Patient not taking: Reported on 07/25/2014 05/04/14 05/04/15  Adam Phenix, MD   BP 154/99 mmHg  Pulse 99  Temp(Src) 98.9 F (37.2 C) (Oral)  Resp 18  SpO2 100%  LMP 03/09/2014 Physical Exam  Constitutional: She is oriented to person, place, and time. She appears well-developed and well-nourished.  HENT:  Head: Normocephalic and atraumatic.  Right Ear: External ear normal.  Left Ear: External ear normal.  Mouth/Throat: Oropharynx is clear and moist. No oropharyngeal exudate.  Normal appearance of the posterior oropharynx without lesions or asymmetry.  No trismus.  No evidence of intraoral abscess or dental infection.  No mastoid tenderness.  Mild opacification  of the right tympanic membrane but otherwise tympanic membranes normal in appearance.  Eyes: Conjunctivae and EOM are normal. Pupils are equal, round, and reactive to light.  Neck: Normal range of motion. Neck supple.  Tenderness to palpation of the right angle of the jaw up to the inferior aspect of the right ear.  Mild to moderate tenderness of bilateral anterior cervical adenopathy.  Normal range of motion of the neck.  Cardiovascular: Normal rate, regular rhythm, normal heart sounds and intact distal pulses.  Exam reveals no gallop and no friction rub.   No murmur heard. Pulmonary/Chest: Effort normal and breath sounds normal. No respiratory distress. She has no wheezes.  Abdominal: Soft. Bowel sounds are normal. There is no tenderness. There is no rebound and no guarding.  Musculoskeletal: Normal range of motion. She exhibits no edema or tenderness.  Neurological: She is alert and oriented to person, place, and time.  Skin: Skin is warm and dry.  Psychiatric: She has a normal mood and affect. Her behavior is normal.  Nursing note and vitals reviewed.   ED Course  Procedures (including critical  care time) Labs Review Labs Reviewed  CBC WITH DIFFERENTIAL/PLATELET - Abnormal; Notable for the following:    WBC 3.4 (*)    Neutrophils Relative % 37 (*)    Neutro Abs 1.3 (*)    Lymphocytes Relative 50 (*)    All other components within normal limits  COMPREHENSIVE METABOLIC PANEL - Abnormal; Notable for the following:    Sodium 133 (*)    AST 43 (*)    Alkaline Phosphatase 38 (*)    Total Bilirubin 2.2 (*)    GFR calc non Af Amer 85 (*)    All other components within normal limits  RAPID STREP SCREEN  CULTURE, GROUP A STREP  D-DIMER, QUANTITATIVE  I-STAT TROPOININ, ED    Imaging Review Dg Chest 2 View  09/14/2014   CLINICAL DATA:  Three-day history of chest tightness  EXAM: CHEST  2 VIEW  COMPARISON:  February 08, 2014  FINDINGS: Lungs are clear. Heart size and pulmonary  vascularity are normal. No adenopathy. No pneumothorax. No bone lesions.  IMPRESSION: No edema or consolidation.   Electronically Signed   By: Bretta Bang III M.D.   On: 09/14/2014 15:29     EKG Interpretation   Date/Time:  Friday September 14 2014 14:54:59 EST Ventricular Rate:  69 PR Interval:  156 QRS Duration: 72 QT Interval:  404 QTC Calculation: 432 R Axis:   53 Text Interpretation:  Normal sinus rhythm Normal ECG No significant change  since last tracing Confirmed by Obi Scrima  MD, Kristiann Noyce ([id number]) on 09/14/2014  4:12:03 PM      MDM   Final diagnoses:  Viral URI  Otalgia, right    4:28 PM 29 y.o. female who presents with right ear and neck pain which began 5 days ago. She has had intermittent fever since that time. She has been on amoxicillin prescribed by her PCP for about 4-5 days. She states her symptoms started she also had some rhinorrhea and nasal congestion. That seems to have resolved but she continues to have worsening pain. She is afebrile and vital signs are unremarkable here. She also reports that she has had some heaviness on her chest for the last few days which is constant unwavering. She relates it to smoking she has had this sensation before. Screening labs including a chest x-ray and d-dimer are noncontributory. She is low risk for MACE per HEART score.    4:41 PM: at this time I suspect the patient has a viral URI. No reason to get a CT at this time. Will rec she cont amoxil. Will try stronger pain control. Return precautions provided for any difficulty swallowing, intractable fever, worsening pain.     Medications given during this visit Medications  oxyCODONE-acetaminophen (PERCOCET/ROXICET) 5-325 MG per tablet 1 tablet (not administered)    New Prescriptions   OXYCODONE-ACETAMINOPHEN (PERCOCET) 5-325 MG PER TABLET    Take 1 tablet by mouth every 6 (six) hours as needed for moderate pain.      Purvis Sheffield, MD 09/14/14 [phone]

## 2014-09-14 NOTE — ED Notes (Signed)
Unable to place pt on cardiac monitoring at this time due to pt not in a regular treatment room

## 2014-09-14 NOTE — ED Notes (Signed)
Pt reports having fever, severe pain to throat, right ear and right face. Has been on antibiotics x 72 hours with no relief of symptoms. Airway intact.

## 2014-09-14 NOTE — ED Provider Notes (Signed)
MSE was initiated and I personally evaluated the patient and placed orders (if any) at  2:52 PM on September 14, 2014.   Brenda Blankenship is a 29 y/o F with PMHx of preterm labor, anemia, pregnancy-induced hypertension, dysfunctional uterine bleeding Presenting to the ED with sore throat, right ear pain, right sided neck and jaw pain does been ongoing since Sunday night. Patient reports that the pain worsens with swallowing. Reported that the right ear pain is a sharp discomfort. Reported that she was seen and assessed by her primary care provider on Monday and started on amoxicillin. Patient reported that the pain is gone progressively worse. Patient reported that she's been having intermittent dizziness. Reported fever of 101.18F, reported that she took 600 mg ibuprofen this morning. Reported that she's been experiencing chest pressure, some chest pain with association difficulty breathing since last night. Reported that the pain is constant. Reported that he feels as if someone is sitting on her chest. Reported abdominal discomfort and diarrhea since Monday. Reported sick contacts of strep from daughter and viral illness from husband. Denied shortness of breath, cough, sinus pressure, syncope, drainage from the ear, pain behind the ear, travel, nausea, vomiting, blurred vision, sudden loss of vision, leg swelling. PCP none  Alert and oriented. Lungs clear to auscultation to upper lower lobes bilaterally. Negative pain upon palpation to chest wall. Negative trismus. Negative swelling, edema, ulcerations or sores identified to the posterior oropharynx. Mild erythema identified to the posterior oropharynx and tonsils bilaterally. Negative tonsillar adenopathy. Negative petechiae or exudate. Positive tonsillar lymphadenopathy to the right side-shotty, mobile soft, tender upon palpation. Negative mastoid tenderness. Right otitis media identified to have swelling and opaque fluid. Negative active drainage or  bleeding.  Patient presented to the ED with worsening symptoms after taking amoxicillin. Patient presented to the ED with chest pain, pressure described as if someone is sitting on her chest. EKG and orders have been placed. Patient to be moved to main ED secondary to active chest pain at this time to be placed on cardiac monitoring.  The patient appears stable so that the remainder of the MSE may be completed by another provider.  [name], PA-C 09/14/14 1502  Juliet RudeNathan R. Rubin PayorPickering, MD 09/18/14 [id number]

## 2014-09-16 LAB — CULTURE, GROUP A STREP: Strep A Culture: NEGATIVE

## 2014-09-25 ENCOUNTER — Emergency Department (HOSPITAL_COMMUNITY)
Admission: EM | Admit: 2014-09-25 | Discharge: 2014-09-25 | Disposition: A | Discharge status: Home / Self Care | Payer: BLUE CROSS/BLUE SHIELD | Attending: Emergency Medicine | Admitting: Emergency Medicine

## 2014-09-25 ENCOUNTER — Encounter (HOSPITAL_COMMUNITY): Payer: Self-pay | Admitting: Neurology

## 2014-09-25 DIAGNOSIS — Z8742 Personal history of other diseases of the female genital tract: Secondary | ICD-10-CM | POA: Diagnosis not present

## 2014-09-25 DIAGNOSIS — Z720 Tobacco use: Secondary | ICD-10-CM | POA: Diagnosis not present

## 2014-09-25 DIAGNOSIS — Z791 Long term (current) use of non-steroidal anti-inflammatories (NSAID): Secondary | ICD-10-CM | POA: Insufficient documentation

## 2014-09-25 DIAGNOSIS — Z8751 Personal history of pre-term labor: Secondary | ICD-10-CM | POA: Insufficient documentation

## 2014-09-25 DIAGNOSIS — Z8679 Personal history of other diseases of the circulatory system: Secondary | ICD-10-CM | POA: Diagnosis not present

## 2014-09-25 DIAGNOSIS — Z862 Personal history of diseases of the blood and blood-forming organs and certain disorders involving the immune mechanism: Secondary | ICD-10-CM | POA: Diagnosis not present

## 2014-09-25 DIAGNOSIS — Z792 Long term (current) use of antibiotics: Secondary | ICD-10-CM | POA: Diagnosis not present

## 2014-09-25 DIAGNOSIS — G500 Trigeminal neuralgia: Secondary | ICD-10-CM | POA: Diagnosis not present

## 2014-09-25 DIAGNOSIS — R509 Fever, unspecified: Secondary | ICD-10-CM | POA: Diagnosis present

## 2014-09-25 DIAGNOSIS — Z79899 Other long term (current) drug therapy: Secondary | ICD-10-CM | POA: Diagnosis not present

## 2014-09-25 MED ORDER — OXYCODONE-ACETAMINOPHEN 5-325 MG PO TABS: 1.0000 | ORAL_TABLET | Freq: Four times a day (QID) | ORAL | Status: DC | PRN

## 2014-09-25 MED ORDER — HYDROCODONE-ACETAMINOPHEN 5-325 MG PO TABS
1.0000 | ORAL_TABLET | Freq: Once | ORAL | Status: AC
  Administered 2014-09-25: 1 via ORAL
  Filled 2014-09-25: qty 1

## 2014-09-25 NOTE — ED Provider Notes (Signed)
CSN: [id number]     Arrival date & time 09/25/14  1721 History   First MD Initiated Contact with Patient 09/25/14 1744     Chief Complaint  Patient presents with  . Otalgia     (Consider location/radiation/quality/duration/timing/severity/associated sxs/prior Treatment) Patient is a 29 y.o. female presenting with ear pain. The history is provided by the patient. No language interpreter was used.  Otalgia Associated symptoms: fever   Associated symptoms: no cough, no ear discharge, no hearing loss and no sore throat   Brenda Blankenship had a right otitis media 2 weeks ago and was seen 1 week ago for persistent pain on the right side of her face. Today she presents for continued sharp right sided jaw pain and intermittent subjective fever for 1 week. Intensity is 8/10. She denies any rash, difficulty eating, dentalgia, otalgia, or throat pain. She finished a two week course of amoxicillin yesterday.   Past Medical History  Diagnosis Date  . Environmental allergies   . Preterm labor   . Anemia   . Abnormal cervical Papanicolaou smear     cryo  . Pregnancy induced hypertension   . DUB (dysfunctional uterine bleeding)    Past Surgical History  Procedure Laterality Date  . Cryotherapy    . Tubal ligation    . Laparoscopic tubal ligation Bilateral 11/10/2013    Procedure: bilateral LAPAROSCOPIC TUBAL LIGATION with filshie clips;  Surgeon: Freddrick March. Tenny Craw, MD;  Location: [facility];  Service: Gynecology;  Laterality: Bilateral;  . Vaginal hysterectomy N/A 04/12/2014    Procedure: HYSTERECTOMY VAGINAL;  Surgeon: Adam Phenix, MD;  Location: [facility];  Service: Gynecology;  Laterality: N/A;  . Bilateral salpingectomy Bilateral 04/12/2014    Procedure: BILATERAL SALPINGECTOMY;  Surgeon: Adam Phenix, MD;  Location: [facility];  Service: Gynecology;  Laterality: Bilateral;   Family History  Problem Relation Age of Onset  . Hypertension Mother   . Hypertension Father   . Hyperlipidemia Father   .  Diabetes Maternal Grandmother   . Cancer Paternal Grandmother     started in lung  . Hypertension Paternal Grandmother    History  Substance Use Topics  . Smoking status: Current Every Day Smoker -- 0.25 packs/day for 5 years    Types: Cigarettes  . Smokeless tobacco: Never Used     Comment: stopped with preg  . Alcohol Use: No   OB History    Gravida Para Term Preterm AB TAB SAB Ectopic Multiple Living   0 1 1 0 0 0 2     Review of Systems  Constitutional: Positive for fever. Negative for chills.  HENT: Negative for dental problem, ear discharge, ear pain, hearing loss, sinus pressure, sore throat and trouble swallowing.   Eyes: Negative for pain and visual disturbance.  Respiratory: Negative for cough and shortness of breath.   Cardiovascular: Negative for chest pain.  Neurological: Negative for numbness.      Allergies  Hydrocodone and Naprosyn  Home Medications   Prior to Admission medications   Medication Sig Start Date End Date Taking? Authorizing Provider  ciprofloxacin (CIPRO) 500 MG tablet Take 1 tablet (500 mg total) by mouth 2 (two) times daily. Patient not taking: Reported on 07/25/2014 05/04/14   Adam Phenix, MD  diphenhydrAMINE (BENADRYL) 25 mg capsule Take 25 mg by mouth every 6 (six) hours as needed for allergies.    Historical Provider, MD  hydrOXYzine (ATARAX/VISTARIL) 10 MG tablet Take 1 tablet (10 mg total) by mouth  every 6 (six) hours as needed for itching. 07/25/14   Roxy Horsemanobert Browning, PA-C  ibuprofen (ADVIL,MOTRIN) 600 MG tablet Take 1 tablet (600 mg total) by mouth every 6 (six) hours as needed for mild pain. 05/04/14   Adam PhenixJames G Arnold, MD  oxyCODONE-acetaminophen (PERCOCET/ROXICET) 5-325 MG per tablet Take 1 tablet by mouth every 6 (six) hours as needed for severe pain. 09/25/14   Brenda Lehr Patel-Mills, PA-C  permethrin (ELIMITE) 5 % cream Apply to entire body other than face - let sit for 14 hours then wash off, may repeat in 1 week if still having  symptoms 07/25/14   Roxy Horsemanobert Browning, PA-C  phenazopyridine (PYRIDIUM) 200 MG tablet Take 1 tablet (200 mg total) by mouth 3 (three) times daily as needed for pain. Patient not taking: Reported on 07/25/2014 05/04/14 05/04/15  Adam PhenixJames G Arnold, MD   BP 169/81 mmHg  Pulse 58  Temp(Src) 98.2 F (36.8 C) (Oral)  Resp 16  SpO2 100%  LMP 03/09/2014    Physical Exam  Constitutional: She is oriented to person, place, and time. She appears well-developed and well-nourished.  HENT:  Head:    Reproducible pain and tenderness to palpation of the left mandible. No ear pain or middle ear effusion. No bulging or erythematous tympanic membrane.  No posterior auricular erythema or protrusion of the right auricle.   No dental pain or abscess.  No pain to palpation of the Stensen's duct.   Neck:  No anterior cervical chain lymphadenopathy.    Cardiovascular: Normal rate, regular rhythm and normal heart sounds.   Pulmonary/Chest: Effort normal and breath sounds normal.  Neurological: She is alert and oriented to person, place, and time.  Skin: Skin is warm and dry.  No rash noted.   Nursing note and vitals reviewed.   ED Course  Procedures (including critical care time) Labs Review Labs Reviewed - No data to display  Imaging Review No results found.   EKG Interpretation None      MDM   Final diagnoses:  Trigeminal neuralgia of right side of face   No signs of infection or rash. No labs or imaging needed at this time.   Discussed return precautions: rash, swelling, or persistent fever. Resource guide given for PCP.    Catha GosselinHanna Patel-Mills, PA-C 09/26/14 1406  Purvis SheffieldForrest Harrison, MD 09/27/14 1012

## 2014-09-25 NOTE — Discharge Instructions (Signed)
Neuropathic Pain We often think that pain has a physical cause. If we get rid of the cause, the pain should go away. Nerves themselves can also cause pain. It is called neuropathic pain, which means nerve abnormality. It may be difficult for the patients who have it and for the treating caregivers. Pain is usually described as acute (short-lived) or chronic (long-lasting). Acute pain is related to the physical sensations caused by an injury. It can last from a few seconds to many weeks, but it usually goes away when normal healing occurs. Chronic pain lasts beyond the typical healing time. With neuropathic pain, the nerve fibers themselves may be damaged or injured. They then send incorrect signals to other pain centers. The pain you feel is real, but the cause is not easy to find.  CAUSES  Chronic pain can result from diseases, such as diabetes and shingles (an infection related to chickenpox), or from trauma, surgery, or amputation. It can also happen without any known injury or disease. The nerves are sending pain messages, even though there is no identifiable cause for such messages.   Other common causes of neuropathy include diabetes, phantom limb pain, or Regional Pain Syndrome (RPS).  As with all forms of chronic back pain, if neuropathy is not correctly treated, there can be a number of associated problems that lead to a downward cycle for the patient. These include depression, sleeplessness, feelings of fear and anxiety, limited social interaction and inability to do normal daily activities or work.  The most dramatic and mysterious example of neuropathic pain is called "phantom limb syndrome." This occurs when an arm or a leg has been removed because of illness or injury. The brain still gets pain messages from the nerves that originally carried impulses from the missing limb. These nerves now seem to misfire and cause troubling pain.  Neuropathic pain often seems to have no cause. It responds  poorly to standard pain treatment. Neuropathic pain can occur after:  Shingles (herpes zoster virus infection).  A lasting burning sensation of the skin, caused usually by injury to a peripheral nerve.  Peripheral neuropathy which is widespread nerve damage, often caused by diabetes or alcoholism.  Phantom limb pain following an amputation.  Facial nerve problems (trigeminal neuralgia).  Multiple sclerosis.  Reflex sympathetic dystrophy.  Pain which comes with cancer and cancer chemotherapy.  Entrapment neuropathy such as when pressure is put on a nerve such as in carpal tunnel syndrome.  Back, leg, and hip problems (sciatica).  Spine or back surgery.  HIV Infection or AIDS where nerves are infected by viruses. Your caregiver can explain items in the above list which may apply to you. SYMPTOMS  Characteristics of neuropathic pain are:  Severe, sharp, electric shock-like, shooting, lightening-like, knife-like.  Pins and needles sensation.  Deep burning, deep cold, or deep ache.  Persistent numbness, tingling, or weakness.  Pain resulting from light touch or other stimulus that would not usually cause pain.  Increased sensitivity to something that would normally cause pain, such as a pinprick. Pain may persist for months or years following the healing of damaged tissues. When this happens, pain signals no longer sound an alarm about current injuries or injuries about to happen. Instead, the alarm system itself is not working correctly.  Neuropathic pain may get worse instead of better over time. For some people, it can lead to serious disability. It is important to be aware that severe injury in a limb can occur without a proper, protective pain  response.Burns, cuts, and other injuries may go unnoticed. Without proper treatment, these injuries can become infected or lead to further disability. Take any injury seriously, and consult your caregiver for treatment. DIAGNOSIS    When you have a pain with no known cause, your caregiver will probably ask some specific questions:   Do you have any other conditions, such as diabetes, shingles, multiple sclerosis, or HIV infection?  How would you describe your pain? (Neuropathic pain is often described as shooting, stabbing, burning, or searing.)  Is your pain worse at any time of the day? (Neuropathic pain is usually worse at night.)  Does the pain seem to follow a certain physical pathway?  Does the pain come from an area that has missing or injured nerves? (An example would be phantom limb pain.)  Is the pain triggered by minor things such as rubbing against the sheets at night? These questions often help define the type of pain involved. Once your caregiver knows what is happening, treatment can begin. Anticonvulsant, antidepressant drugs, and various pain relievers seem to work in some cases. If another condition, such as diabetes is involved, better management of that disorder may relieve the neuropathic pain.  TREATMENT  Neuropathic pain is frequently long-lasting and tends not to respond to treatment with narcotic type pain medication. It may respond well to other drugs such as antiseizure and antidepressant medications. Usually, neuropathic problems do not completely go away, but partial improvement is often possible with proper treatment. Your caregivers have large numbers of medications available to treat you. Do not be discouraged if you do not get immediate relief. Sometimes different medications or a combination of medications will be tried before you receive the results you are hoping for. See your caregiver if you have pain that seems to be coming from nowhere and does not go away. Help is available.  SEEK IMMEDIATE MEDICAL CARE IF:   There is a sudden change in the quality of your pain, especially if the change is on only one side of the body.  You notice changes of the skin, such as redness, black or  purple discoloration, swelling, or an ulcer.  You cannot move the affected limbs. Document Released: 04/09/2004 Document Revised: 10/05/2011 Document Reviewed: 04/09/2004 [facility] Patient Information 2015 [place], [place]. This information is not intended to replace advice given to you by your health care provider. Make sure you discuss any questions you have with your health care provider.  Trigeminal Neuralgia Trigeminal neuralgia is a nerve disorder that causes sudden attacks of severe facial pain. It is caused by damage to the trigeminal nerve, a major nerve in the face. It is more common in women and in the elderly, although it can also happen in younger patients. Attacks last from a few seconds to several minutes and can occur from a couple of times per year to several times per day. Trigeminal neuralgia can be a very distressing and disabling condition. Surgery may be needed in very severe cases if medical treatment does not give relief. HOME CARE INSTRUCTIONS   If your caregiver prescribed medication to help prevent attacks, take as directed.  To help prevent attacks:  Chew on the unaffected side of the mouth.  Avoid touching your face.  Avoid blasts of hot or cold air.  Men may wish to grow a beard to avoid having to shave. SEEK IMMEDIATE MEDICAL CARE IF:  Pain is unbearable and your medicine does not help.  You develop new, unexplained symptoms (problems).  You have  problems that may be related to a medication you are taking. Document Released: 07/10/2000 Document Revised: 10/05/2011 Document Reviewed: 05/10/2009 [facility] Patient Information 2015 [place]. This information is not intended to replace advice given to you by your health care provider. Make sure you discuss any questions you have with your health care provider.   Emergency Department Resource Guide 1) Find a Doctor and Pay Out of Pocket Although you won't have to find out who is covered by your insurance  plan, it is a good idea to ask around and get recommendations. You will then need to call the office and see if the doctor you have chosen will accept you as a new patient and what types of options they offer for patients who are self-pay. Some doctors offer discounts or will set up payment plans for their patients who do not have insurance, but you will need to ask so you aren't surprised when you get to your appointment.  2) Contact Your Local Health Department Not all health departments have doctors that can see patients for sick visits, but many do, so it is worth a call to see if yours does. If you don't know where your local health department is, you can check in your phone book. The CDC also has a tool to help you locate your state's health department, and many state websites also have listings of all of their local health departments.  3) Find a Walk-in Clinic If your illness is not likely to be very severe or complicated, you may want to try a walk in clinic. These are popping up all over the country in pharmacies, drugstores, and shopping centers. They're usually staffed by nurse practitioners or physician assistants that have been trained to treat common illnesses and complaints. They're usually fairly quick and inexpensive. However, if you have serious medical issues or chronic medical problems, these are probably not your best option.  No Primary Care Doctor: - Call Health Connect at  [phone] - they can help you locate a primary care doctor that  accepts your insurance, provides certain services, etc. - Physician Referral Service- [phone]  Chronic Pain Problems: Organization         Address  Phone   Notes  [facility]  [phone]) [phone] Patients need to be referred by their primary care doctor.   Medication Assistance: Organization         Address  Phone   Notes  [facility] [address] [place], [place] [address]  [phone]) [phone] --Must be a resident of [place] -- Must have NO insurance coverage whatsoever (no Medicaid/ Medicare, etc.) -- The pt. MUST have a primary care doctor that directs their care regularly and follows them in the community   [facility]ist  [phone]   [employer]  [phone]    Agencies that provide inexpensive medical care: Organization         Address  Phone   Notes  [facility]  [phone](336) [phone]   [facility]    [phone]   [facility] [address] [place], [place] [address] [phone]   [facility] [address], [place] [phone]) [phone]   [facility]    [phone](336) [phone]   [facility]    [phone](336) [phone]   [facility]  [address], [place] Phone:  [phone]) [phone], Fax:  [phone])  [phone] Hours of Operation:  9 am - 6 pm, M-F.  Also accepts Medicaid/Medicare and self-pay.  [facility]  [address], [place] Phone: [phone], Fax: [phone]. Hours of Operation:  8:30 am - 5:30 pm, M-F.  Also accepts Medicaid and self-pay.  [facility] [place] [address], [place] Phone: [phone]   [facility] [address] Natasha Bence [place], [place] [phone], Ext. [phone] Mondays & Thursdays: 7-9 AM.  First 15 patients are seen on a first come, first serve basis.    Medicaid-accepting [facility] Providers:  Organization         Address  Phone   Notes  [facility] [address],  [phone] Also accepts self-pay patients.  [facility] [address] Laurell Josephs [place], [place]  [phone]   [facility] [address], [place] [phone]   [facility] [address] [place] [phone]   Renaye Rakers [address], [place]   [phone] Only accepts [place] Access [place] patients after they have their name applied to their card.   Self-Pay (no insurance) in [facility]:  Organization         Address  Phone   Notes  Sickle Cell Patients, [facility] [address] [place], [place] [phone]   [facility] [address] [place], [place] [phone]   [facility]  [address],  [phone]   [facility]/Dr. Osei-Bonsu  [address], [place] or [address], [place] [phone] Phone number for both [place] and [place] locations is the same.  [facility] [address] [place] [phone]   [facility] [address], [place] or [address] [phone]   [facility] [address], [place] [phone], phone; [phone], fax Sees patients 1st and 3rd Saturday of every month.  Must not qualify for public or private insurance (i.e. Medicaid, Medicare, [place] Health Choice, Veterans' Benefits)  Household income should be no more than 200% of the poverty level The clinic cannot treat you if you are pregnant or think you are pregnant  Sexually transmitted diseases are not treated at the clinic.    Dental Care: Organization         Address  Phone  Notes  [facility] Department of [facility] [address] [place], [place] [phone] Accepts children up to age 73 who are enrolled in [place] or [place] Health Choice; pregnant women with a Medicaid card; and children who have applied for Medicaid or [place] Health Choice, but were declined, whose parents can pay a reduced fee at time of service.  [facility] Department of [facility]  [address], [place] [phone] Accepts children up to age 28 who are enrolled in [place] or [place] Health  Choice; pregnant women with a Medicaid card; and children who have applied for Medicaid or [place] Health Choice, but were declined, whose parents can pay a reduced fee at time of service.  [facility]  [address] [place], [place] [phone] Patients are seen by appointment only. Walk-ins are not accepted. [facility]  will see patients 54 years of age and older. Monday - Tuesday (8am-5pm) Most Wednesdays (8:30-5pm) $30 per visit, cash only  [facility] Access [facility]  [address], [facility] [phone] Patients are seen by appointment only. Walk-ins are not accepted. [facility] will see patients 36 years of age and older. One Wednesday Evening (Monthly: Volunteer Based).  $30 per visit, cash only  [employer]  [phone] for adults; Children under age 2, call Graduate Pediatric Dentistry at [phone]. Children aged 49-14, please call [phone] to request a pediatric application.  Dental services are provided in all areas of dental care including fillings, crowns and bridges, complete and partial dentures, implants, gum treatment, root canals, and extractions. Preventive care is also provided. Treatment is provided to both adults and children. Patients are selected via a lottery and there is often a waiting list.   [facility] [address], [place]  [phone] [email or web address]   [facility] [address] [place], [place] [phone], Ext. [phone] Second and Fourth Thursday of each month, opens at 6:30 AM; Clinic ends at 9 AM.  Patients are seen on a first-come first-served basis, and a limited number are seen during each clinic.   [facility]  [address] Ether Griffins [place], [place] [phone]   Eligibility Requirements You must have lived in [place], [place], or [place] counties for at least the last three months.   You cannot be eligible for state or  federal sponsored [place], including [employer], [place], or [employer].   You generally cannot be eligible for healthcare insurance through your employer.    How to apply: Eligibility screenings are held every Tuesday and Wednesday afternoon from 1:00 pm until 4:00 pm. You do not need an appointment for the interview!  [facility] [address], [place], [place] [phone]   [facility] Health Department  [phone]   [facility] Health Department  [phone]   [facility] Health Department  [phone]    Behavioral Health Resources in the Community: Intensive Outpatient Programs Organization         Address  Phone  Notes  [facility] Services [address] [place], [place] [phone]   [facility] Outpatient [address], [place], [place] [phone]   [facility] [address] [place], [place]  [phone]   [facility] Mental Health [address]  [place], [place] [phone] or [phone]   Substance Abuse Resources Organization         Address  Phone  Notes  [facility] Drug [facility]  [phone]   [facility]  [phone]   [facility]  [phone]   Floydene Flock  [phone]   Residential & Outpatient Substance Abuse Program  [phone]   Psychological Services Organization         Address  Phone  Notes  [facility] Behavioral Health  [phone]   [facility] Services  [phone]   [facility] Mental Health [address] [place] [phone] or [phone]    Mobile Crisis Teams Organization         Address  Phone  Notes  [facility], Mobile Crisis Care Unit  [phone]   [facility]  [address] [place], [place] [phone]   [facility] [address] [place] [phone]    Self-Help/Support Groups Organization  Address  Phone              Notes  [facility] [place] - variety of support groups  336- I7437963 Call for more information  Narcotics Anonymous (NA), [facility] [address], [employer] [place]  2 meetings at this location   Statistician         Address  Phone  Notes  [facility] [address],    [place]  [phone]   [facility]  [address] [place] [address], [place], [place] [phone]   [facility] [address] [place], [place] [phone] Admissions: 8am-3pm M-F  [facility] [address]    [place], [place] [phone]   [facility] [address] [place], [place], [place] [phone]   [facility] [address]  [place], [place] [phone]   [facility] - Intensive Outpatient [address] Laurell Josephs [address], [place], [place] [phone]   [facility] ([facility] [address] [place]  [place], [place] [phone] or [phone]   [facility] ([facility]) [address], [place], [place] [phone] Accepts Medicaid  [facility] [address],  [place] [phone] Substance Abuse/Addiction Treatment   [facility] Organization         Address  Phone  Notes  [facility]  2194255025   Angie Fava, PhD [address] Ervin Knack [place], [place]   [phone] or [phone]   [facility] Behavioral   [address] [place], [place] ([phone]   [facility] [address] [place], [place] [phone] Insurance/Medicaid/sponsorship through [facility] and Families [address]                                    [place], [place] [phone] Therapy/tele-psych/case  [facility] [address] [place], [place] [phone]    Dr. Lolly Mustache  [phone]   [facility] [place]  [employer] [facility]  Dept. 1) [address] [place] 2) [address], [place] 3)  [address], [place] [phone]  [phone]   [facility] Child Abuse Hotline 313-869-4238 or [phone] (After Hours)

## 2014-09-25 NOTE — ED Notes (Signed)
Pt took last dose of amoxicillin yesterday 2/29.

## 2014-09-25 NOTE — ED Notes (Signed)
Pt reports right ear infection last week, continued pain. Has been taking amoxicillin and has finished.

## 2014-10-31 ENCOUNTER — Encounter (HOSPITAL_COMMUNITY): Payer: Self-pay | Admitting: Emergency Medicine

## 2014-10-31 ENCOUNTER — Emergency Department (INDEPENDENT_AMBULATORY_CARE_PROVIDER_SITE_OTHER)
Admission: EM | Admit: 2014-10-31 | Discharge: 2014-10-31 | Disposition: A | Discharge status: Home / Self Care | Payer: BLUE CROSS/BLUE SHIELD | Source: Home / Self Care | Attending: Emergency Medicine | Admitting: Emergency Medicine

## 2014-10-31 DIAGNOSIS — Z09 Encounter for follow-up examination after completed treatment for conditions other than malignant neoplasm: Secondary | ICD-10-CM

## 2014-10-31 DIAGNOSIS — W57XXXA Bitten or stung by nonvenomous insect and other nonvenomous arthropods, initial encounter: Secondary | ICD-10-CM | POA: Diagnosis not present

## 2014-10-31 DIAGNOSIS — T148 Other injury of unspecified body region: Secondary | ICD-10-CM | POA: Diagnosis not present

## 2014-10-31 DIAGNOSIS — R0789 Other chest pain: Secondary | ICD-10-CM

## 2014-10-31 LAB — TSH: TSH: 2.005 u[IU]/mL (ref 0.350–4.500)

## 2014-10-31 MED ORDER — IBUPROFEN 600 MG PO TABS: 600.0000 mg | ORAL_TABLET | Freq: Four times a day (QID) | ORAL | Status: DC | PRN

## 2014-10-31 MED ORDER — PREDNISONE 50 MG PO TABS: ORAL_TABLET | ORAL | Status: DC

## 2014-10-31 NOTE — ED Notes (Signed)
Pt was getting registered c/o chest pain pt was brought to triage room VS and EKG were done. Per Dr. Piedad ClimesHonig pt was ok and did not need to be roomed until she was called back by staff. Pt was not in any acute distress during this time.

## 2014-10-31 NOTE — ED Provider Notes (Signed)
CSN: [id number]     Arrival date & time 10/31/14  1312 History   First MD Initiated Contact with Patient 10/31/14 1428     No chief complaint on file. CC: chest pain  (Consider location/radiation/quality/duration/timing/severity/associated sxs/prior Treatment) HPI  She is a 29 year old woman here for evaluation of chest pain insect bites. She states for the last 3 days she has had a tight feeling in her left chest. No associated shortness of breath or diaphoresis. No dizziness. She has not tried anything for this.  She states she has a history of a pulled muscle 6 or 7 years ago that felt similar.  She also reports a 2-3 week history of insect bites. They're very itchy. Her daughter was diagnosed with scabies several weeks ago. She has tried using the permethrin without much improvement. She has also tried hydrocortisone cream without much improvement.  No one else at home has any bites.  Past Medical History  Diagnosis Date  . Environmental allergies   . Preterm labor   . Anemia   . Abnormal cervical Papanicolaou smear     cryo  . Pregnancy induced hypertension   . DUB (dysfunctional uterine bleeding)    Past Surgical History  Procedure Laterality Date  . Cryotherapy    . Tubal ligation    . Laparoscopic tubal ligation Bilateral 11/10/2013    Procedure: bilateral LAPAROSCOPIC TUBAL LIGATION with filshie clips;  Surgeon: Freddrick March. Tenny Craw, MD;  Location: [facility];  Service: Gynecology;  Laterality: Bilateral;  . Vaginal hysterectomy N/A 04/12/2014    Procedure: HYSTERECTOMY VAGINAL;  Surgeon: Adam Phenix, MD;  Location: [facility];  Service: Gynecology;  Laterality: N/A;  . Bilateral salpingectomy Bilateral 04/12/2014    Procedure: BILATERAL SALPINGECTOMY;  Surgeon: Adam Phenix, MD;  Location: [facility];  Service: Gynecology;  Laterality: Bilateral;   Family History  Problem Relation Age of Onset  . Hypertension Mother   . Hypertension Father   . Hyperlipidemia Father   . Diabetes  Maternal Grandmother   . Cancer Paternal Grandmother     started in lung  . Hypertension Paternal Grandmother    History  Substance Use Topics  . Smoking status: Current Every Day Smoker -- 0.25 packs/day for 5 years    Types: Cigarettes  . Smokeless tobacco: Never Used     Comment: stopped with preg  . Alcohol Use: No   OB History    Gravida Para Term Preterm AB TAB SAB Ectopic Multiple Living   0 1 1 0 0 0 2     Review of Systems  Constitutional: Negative for fever and diaphoresis.  Respiratory: Negative for shortness of breath.   Cardiovascular: Positive for chest pain. Negative for palpitations and leg swelling.  Gastrointestinal: Positive for constipation. Negative for nausea.  Musculoskeletal:       She does report some clammy hands for the last 2-3 days.  Skin: Positive for rash.  Neurological: Negative for dizziness.    Allergies  Hydrocodone and Naprosyn  Home Medications   Prior to Admission medications   Medication Sig Start Date End Date Taking? Authorizing Provider  diphenhydrAMINE (BENADRYL) 25 mg capsule Take 25 mg by mouth every 6 (six) hours as needed for allergies.    Historical Provider, MD  hydrOXYzine (ATARAX/VISTARIL) 10 MG tablet Take 1 tablet (10 mg total) by mouth every 6 (six) hours as needed for itching. 07/25/14   Roxy Horseman, PA-C  ibuprofen (ADVIL,MOTRIN) 600 MG tablet Take 1 tablet (600 mg  total) by mouth every 6 (six) hours as needed for mild pain. 10/31/14   Charm RingsErin J Rosie Torrez, MD  oxyCODONE-acetaminophen (PERCOCET/ROXICET) 5-325 MG per tablet Take 1 tablet by mouth every 6 (six) hours as needed for severe pain. 09/25/14   Hanna Patel-Mills, PA-C  permethrin (ELIMITE) 5 % cream Apply to entire body other than face - let sit for 14 hours then wash off, may repeat in 1 week if still having symptoms 07/25/14   Roxy Horsemanobert Browning, PA-C  predniSONE (DELTASONE) 50 MG tablet Take 1 pill daily for 5 days. 10/31/14   Charm RingsErin J Aleah Ahlgrim, MD   Temp(Src) 98 F  (36.7 C) (Oral)  LMP 03/09/2014 Physical Exam  Constitutional: She appears well-developed and well-nourished. No distress.  Neck: Neck supple.  Cardiovascular: Normal rate, regular rhythm and normal heart sounds.   No murmur heard. Pulmonary/Chest: Effort normal and breath sounds normal. No respiratory distress. She has no wheezes. She has no rales. She exhibits tenderness (left anterior chest, reproduces her pain.).  Skin:  Scattered wheals over her chest, arms, legs.    ED Course  Procedures (including critical care time) ED ECG REPORT   Date: 10/31/2014  Rate: 70  Rhythm: normal sinus rhythm  QRS Axis: normal  Intervals: normal  ST/T Wave abnormalities: t-wave inversion in V2 only  Conduction Disutrbances:none  Narrative Interpretation:   Old EKG Reviewed: changes noted; new inverted T-wave in V2 only.  I have personally reviewed the EKG tracing and agree with the computerized printout as noted.  Labs Review Labs Reviewed  TSH    Imaging Review No results found.   MDM   1. Chest wall pain   2. Insect bites   3. Postop check    Will treat with a five-day course of prednisone and ibuprofen. We'll also check a TSH given her constipation and clammy hands. Follow-up as needed.    Charm RingsErin J Aydin Cavalieri, MD 10/31/14 [phone]

## 2014-10-31 NOTE — Discharge Instructions (Signed)
The chest pain seems to be coming from the muscles in your chest wall. Take prednisone daily for 5 days. Take ibuprofen 600 mg every 6 hours as needed for pain.  The rash looks like insect bites. The prednisone should help. You can use over-the-counter Benadryl cream on the bites to help with itching.  We tested your thyroid today. We will call you if it comes back abnormal. Follow-up as needed.

## 2014-11-18 IMAGING — CR DG LUMBAR SPINE COMPLETE 4+V
5 series · 5 of 5 positions shown · non-contrast
Comparison: CT 12/07/2013

CLINICAL DATA: Low back pain extending to the buttocks and legs.

EXAM:
LUMBAR SPINE - COMPLETE 4+ VIEW

[t lumbar spine ap]
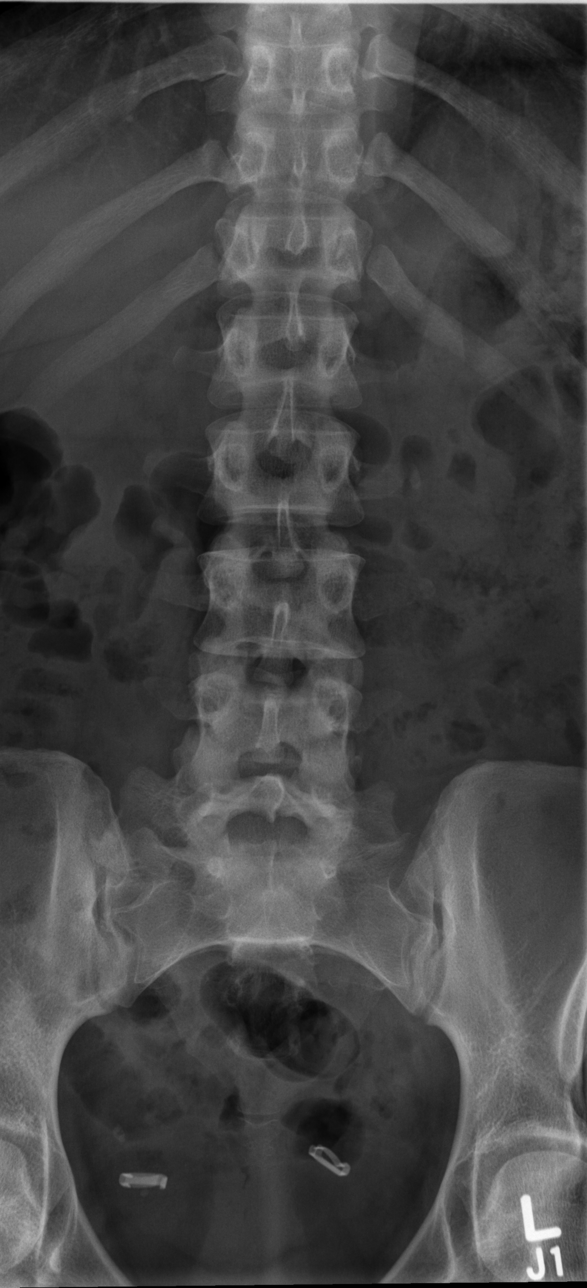

[t lumbar spine obl (1 of 2)]
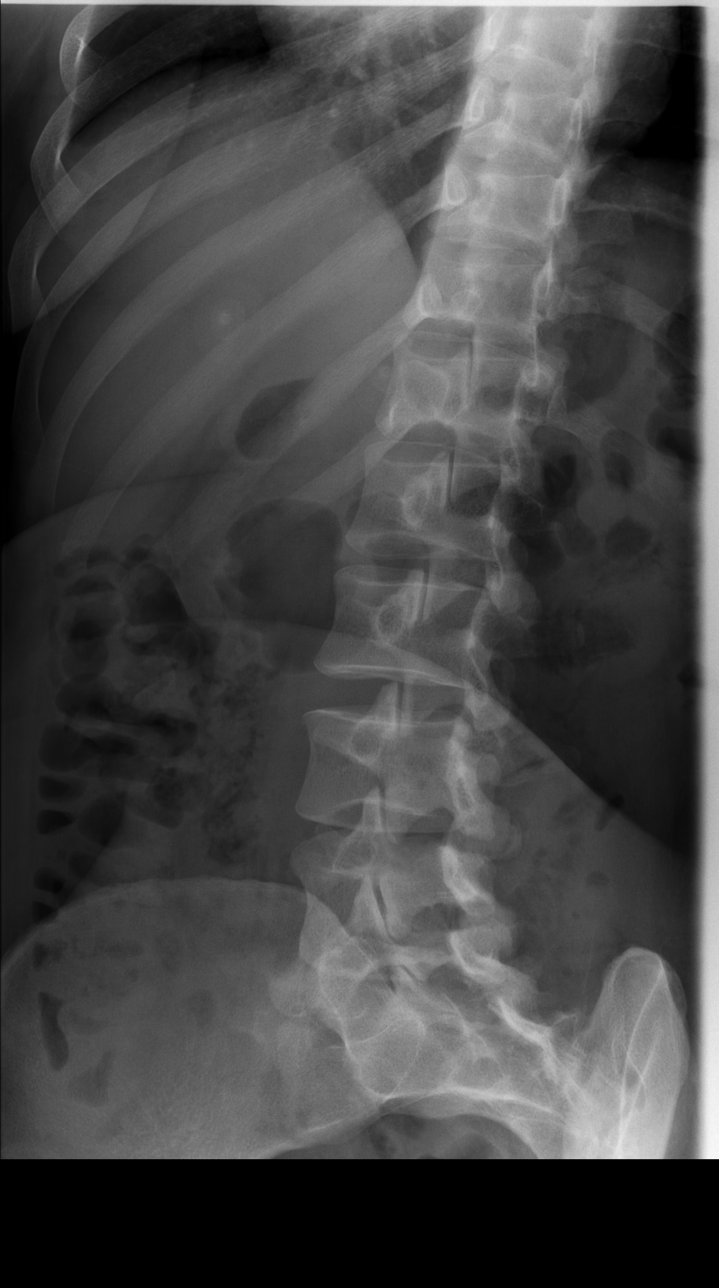

[t lumbar spine obl (2 of 2)]
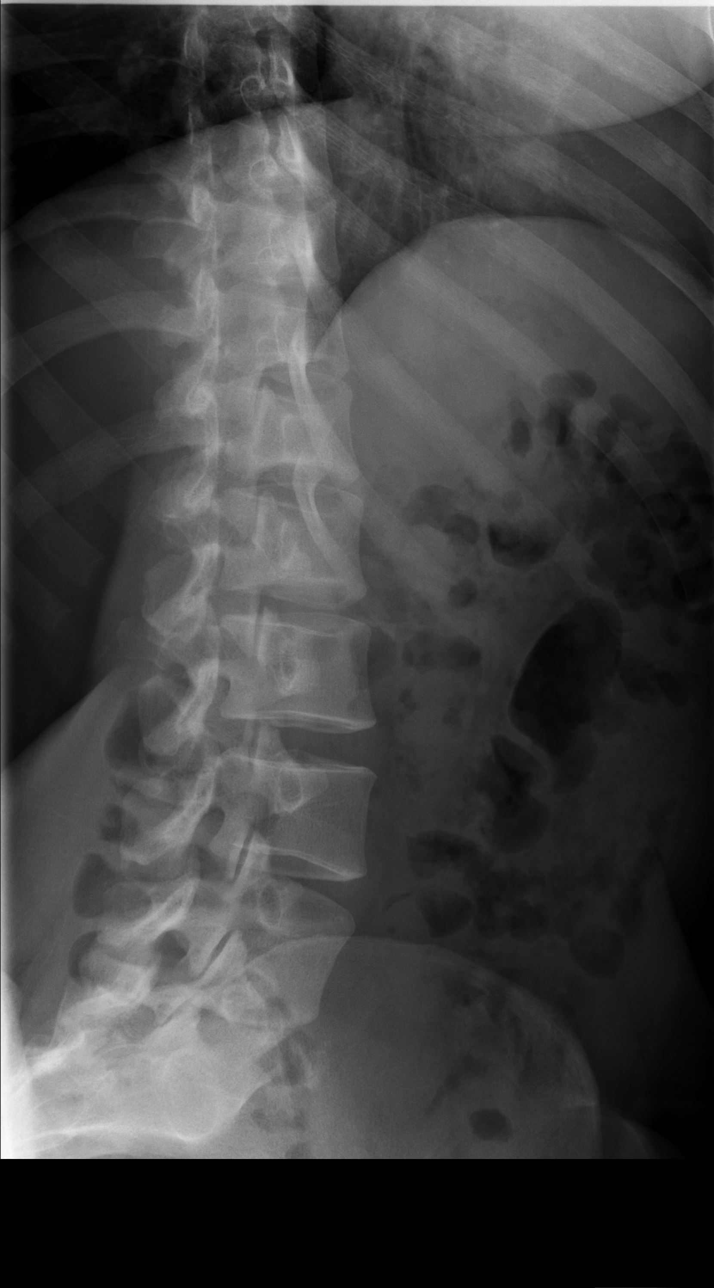

[t lumbar spine lat]
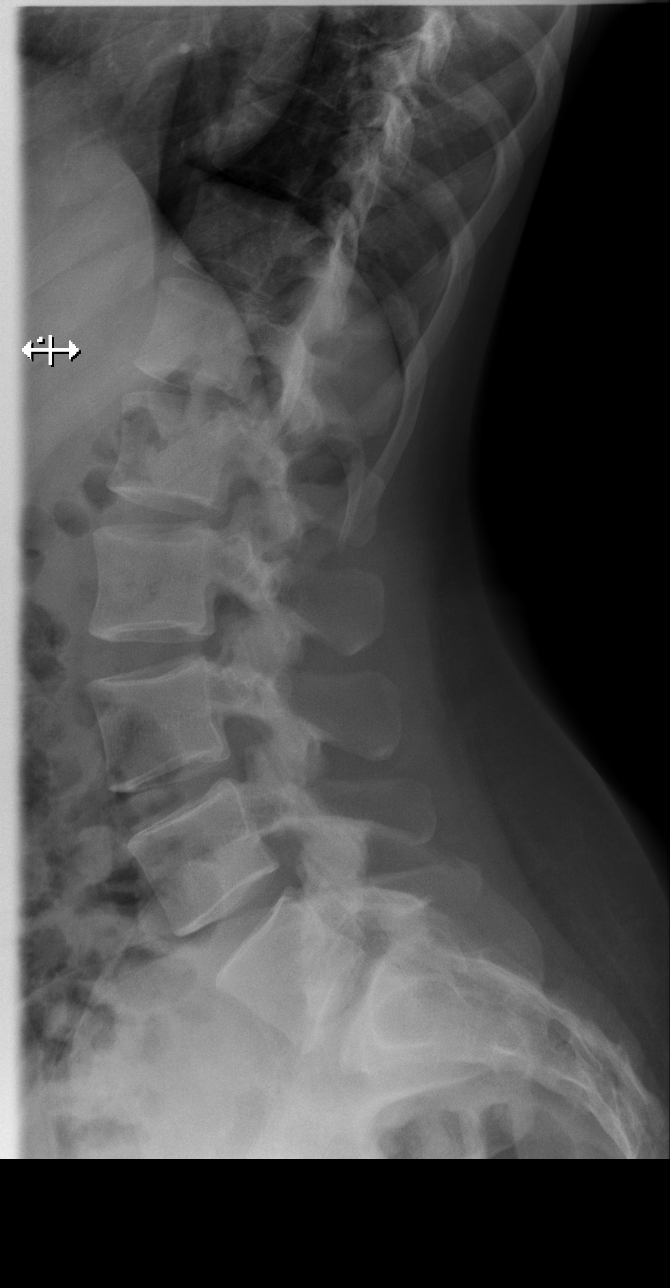

[t lumbar l-5 s-1 spot]
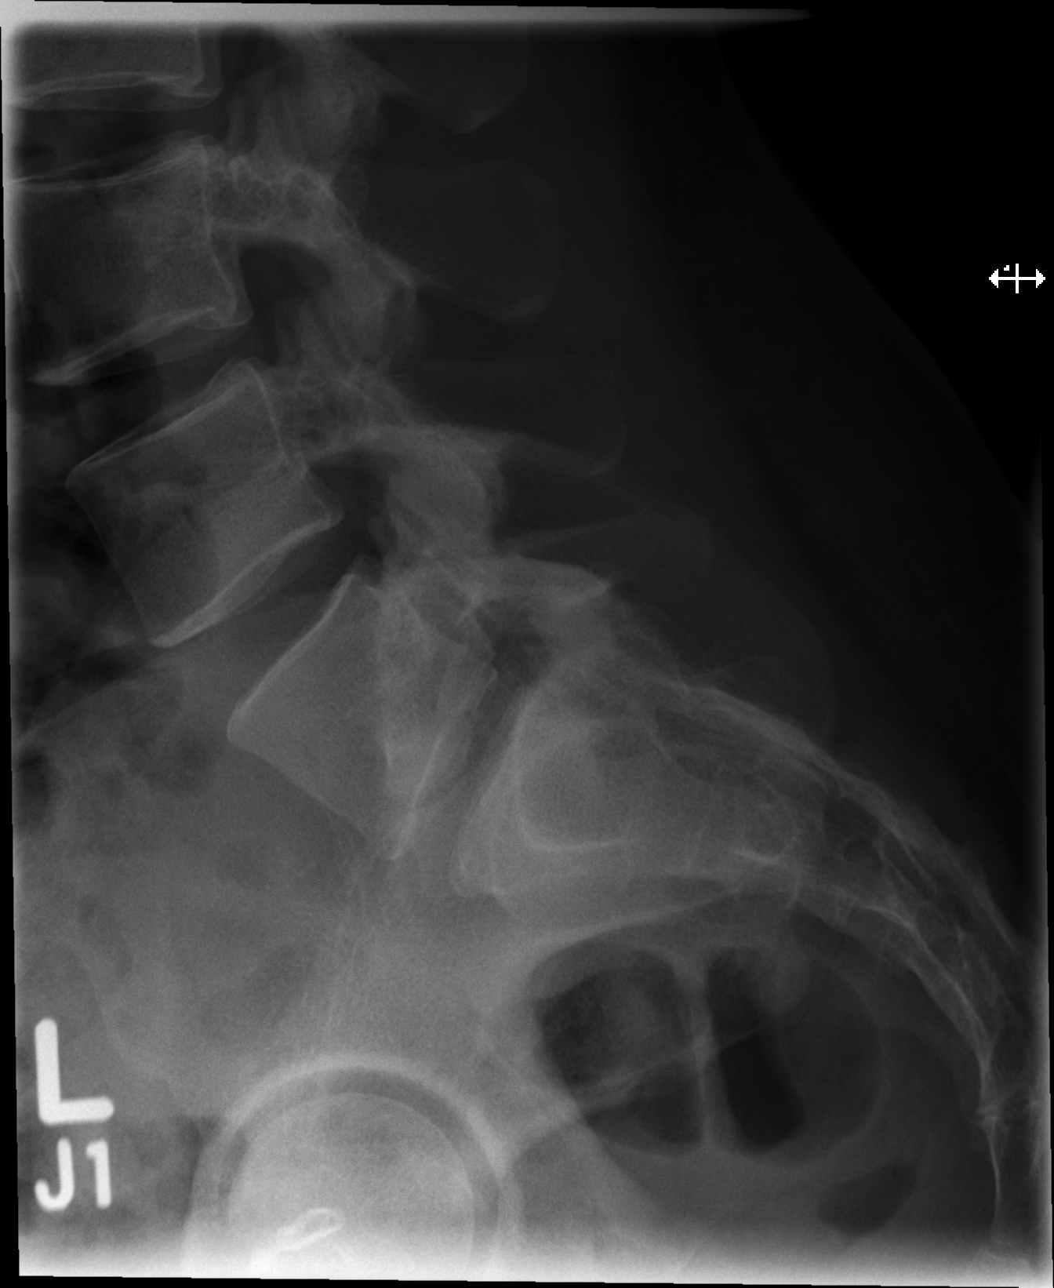

[5 of 5 positions shown; findings below may reference images not displayed]

FINDINGS: There is somewhat exaggerated lumbar lordosis. L5 has some
transitional features on the right. Disc space heights are normal.
No pars defect or slippage. No fracture or focal lesion. Sacroiliac
joints appear normal.
IMPRESSION: L5 has transitional features. Somewhat exaggerated lordosis. These
findings could be associated with low back pain. No definite
significant finding however.

## 2014-12-04 ENCOUNTER — Encounter (HOSPITAL_COMMUNITY): Payer: Self-pay | Admitting: Emergency Medicine

## 2014-12-04 ENCOUNTER — Emergency Department (HOSPITAL_COMMUNITY): Payer: BLUE CROSS/BLUE SHIELD

## 2014-12-04 ENCOUNTER — Emergency Department (HOSPITAL_COMMUNITY)
Admission: EM | Admit: 2014-12-04 | Discharge: 2014-12-04 | Disposition: A | Discharge status: Home / Self Care | Payer: BLUE CROSS/BLUE SHIELD | Attending: Emergency Medicine | Admitting: Emergency Medicine

## 2014-12-04 DIAGNOSIS — Z720 Tobacco use: Secondary | ICD-10-CM | POA: Diagnosis not present

## 2014-12-04 DIAGNOSIS — Z862 Personal history of diseases of the blood and blood-forming organs and certain disorders involving the immune mechanism: Secondary | ICD-10-CM | POA: Insufficient documentation

## 2014-12-04 DIAGNOSIS — Z79899 Other long term (current) drug therapy: Secondary | ICD-10-CM | POA: Diagnosis not present

## 2014-12-04 DIAGNOSIS — R072 Precordial pain: Secondary | ICD-10-CM | POA: Insufficient documentation

## 2014-12-04 DIAGNOSIS — R52 Pain, unspecified: Secondary | ICD-10-CM

## 2014-12-04 DIAGNOSIS — F419 Anxiety disorder, unspecified: Secondary | ICD-10-CM | POA: Diagnosis not present

## 2014-12-04 LAB — COMPREHENSIVE METABOLIC PANEL
ALT: 16 U/L (ref 14–54)
AST: 22 U/L (ref 15–41)
Albumin: 3.2 g/dL — ABNORMAL LOW (ref 3.5–5.0)
Alkaline Phosphatase: 42 U/L (ref 38–126)
Anion gap: 5 (ref 5–15)
BUN: <5 mg/dL — ABNORMAL LOW (ref 6–20)
CO2: 25 mmol/L (ref 22–32)
Calcium: 8.6 mg/dL — ABNORMAL LOW (ref 8.9–10.3)
Chloride: 109 mmol/L (ref 101–111)
Creatinine, Ser: 0.82 mg/dL (ref 0.44–1.00)
GFR calc Af Amer: >60 mL/min (ref >60)
GFR calc non Af Amer: >60 mL/min (ref >60)
Glucose, Bld: 87 mg/dL (ref 70–99)
Potassium: 4.2 mmol/L (ref 3.5–5.1)
Sodium: 139 mmol/L (ref 135–145)
Total Bilirubin: 0.9 mg/dL (ref 0.3–1.2)
Total Protein: 5.7 g/dL — ABNORMAL LOW (ref 6.5–8.1)

## 2014-12-04 LAB — CBC WITH DIFFERENTIAL/PLATELET
Basophils Absolute: 0 10*3/uL (ref 0.0–0.1)
Basophils Relative: 0 % (ref 0–1)
Eosinophils Absolute: 0 10*3/uL (ref 0.0–0.7)
Eosinophils Relative: 2 % (ref 0–5)
HCT: 35.9 % — ABNORMAL LOW (ref 36.0–46.0)
Hemoglobin: 11.8 g/dL — ABNORMAL LOW (ref 12.0–15.0)
Lymphocytes Relative: 40 % (ref 12–46)
Lymphs Abs: 1 10*3/uL (ref 0.7–4.0)
MCH: 27.6 pg (ref 26.0–34.0)
MCHC: 32.9 g/dL (ref 30.0–36.0)
MCV: 84.1 fL (ref 78.0–100.0)
Monocytes Absolute: 0.2 10*3/uL (ref 0.1–1.0)
Monocytes Relative: 7 % (ref 3–12)
Neutro Abs: 1.4 10*3/uL — ABNORMAL LOW (ref 1.7–7.7)
Neutrophils Relative %: 51 % (ref 43–77)
Platelets: 181 10*3/uL (ref 150–400)
RBC: 4.27 MIL/uL (ref 3.87–5.11)
RDW: 13.2 % (ref 11.5–15.5)
WBC: 2.6 10*3/uL — ABNORMAL LOW (ref 4.0–10.5)

## 2014-12-04 LAB — I-STAT CHEM 8, ED
BUN: <3 mg/dL — ABNORMAL LOW (ref 6–20)
Calcium, Ion: 1.18 mmol/L (ref 1.12–1.23)
Chloride: 105 mmol/L (ref 101–111)
Creatinine, Ser: 0.8 mg/dL (ref 0.44–1.00)
Glucose, Bld: 83 mg/dL (ref 70–99)
HCT: 39 % (ref 36.0–46.0)
Hemoglobin: 13.3 g/dL (ref 12.0–15.0)
Potassium: 4.2 mmol/L (ref 3.5–5.1)
Sodium: 140 mmol/L (ref 135–145)
TCO2: 21 mmol/L (ref 0–100)

## 2014-12-04 MED ORDER — ALPRAZOLAM 0.5 MG PO TABS: 0.5000 mg | ORAL_TABLET | Freq: Three times a day (TID) | ORAL | Status: DC | PRN

## 2014-12-04 MED ORDER — ALPRAZOLAM 0.25 MG PO TABS
0.5000 mg | ORAL_TABLET | Freq: Once | ORAL | Status: AC
  Administered 2014-12-04: 0.5 mg via ORAL
  Filled 2014-12-04: qty 2

## 2014-12-04 NOTE — ED Provider Notes (Signed)
CSN: [id number]     Arrival date & time 12/04/14  0806 History   First MD Initiated Contact with Patient 12/04/14 [id number]     Chief Complaint  Patient presents with  . Chest Pain     (Consider location/radiation/quality/duration/timing/severity/associated sxs/prior Treatment) Patient is a 29 y.o. female presenting with chest pain. The history is provided by the patient (pt complains of chest pain and anxiety).  Chest Pain Pain location:  Substernal area Pain quality: aching   Pain radiates to:  Does not radiate Pain radiates to the back: no   Pain severity:  Mild Onset quality:  Sudden Timing:  Constant Progression:  Waxing and waning Chronicity:  New Context: not breathing   Associated symptoms: no abdominal pain, no back pain, no cough, no fatigue and no headache     Past Medical History  Diagnosis Date  . Environmental allergies   . Preterm labor   . Anemia   . Abnormal cervical Papanicolaou smear     cryo  . Pregnancy induced hypertension   . DUB (dysfunctional uterine bleeding)    Past Surgical History  Procedure Laterality Date  . Cryotherapy    . Tubal ligation    . Laparoscopic tubal ligation Bilateral 11/10/2013    Procedure: bilateral LAPAROSCOPIC TUBAL LIGATION with filshie clips;  Surgeon: Freddrick MarchKendra H. Tenny Crawoss, MD;  Location: [facility];  Service: Gynecology;  Laterality: Bilateral;  . Vaginal hysterectomy N/A 04/12/2014    Procedure: HYSTERECTOMY VAGINAL;  Surgeon: Adam PhenixJames G Arnold, MD;  Location: [facility];  Service: Gynecology;  Laterality: N/A;  . Bilateral salpingectomy Bilateral 04/12/2014    Procedure: BILATERAL SALPINGECTOMY;  Surgeon: Adam PhenixJames G Arnold, MD;  Location: [facility];  Service: Gynecology;  Laterality: Bilateral;   Family History  Problem Relation Age of Onset  . Hypertension Mother   . Hypertension Father   . Hyperlipidemia Father   . Diabetes Maternal Grandmother   . Cancer Paternal Grandmother     started in lung  . Hypertension Paternal Grandmother     History  Substance Use Topics  . Smoking status: Current Every Day Smoker -- 0.25 packs/day for 5 years    Types: Cigarettes  . Smokeless tobacco: Never Used     Comment: stopped with preg  . Alcohol Use: No   OB History    Gravida Para Term Preterm AB TAB SAB Ectopic Multiple Living   3 2 2  0 1 1 0 0 0 2     Review of Systems  Constitutional: Negative for appetite change and fatigue.  HENT: Negative for congestion, ear discharge and sinus pressure.   Eyes: Negative for discharge.  Respiratory: Negative for cough.   Cardiovascular: Positive for chest pain.  Gastrointestinal: Negative for abdominal pain and diarrhea.  Genitourinary: Negative for frequency and hematuria.  Musculoskeletal: Negative for back pain.  Skin: Negative for rash.  Neurological: Negative for seizures and headaches.  Psychiatric/Behavioral: Positive for agitation. Negative for hallucinations.      Allergies  Hydrocodone and Naprosyn  Home Medications   Prior to Admission medications   Medication Sig Start Date End Date Taking? Authorizing Provider  ibuprofen (ADVIL,MOTRIN) 200 MG tablet Take 400 mg by mouth every 6 (six) hours as needed for mild pain or moderate pain.   Yes Historical Provider, MD  ALPRAZolam Prudy Feeler(XANAX) 0.5 MG tablet Take 1 tablet (0.5 mg total) by mouth 3 (three) times daily as needed for anxiety. 12/04/14   Bethann BerkshireJoseph Hobie Kohles, MD  hydrOXYzine (ATARAX/VISTARIL) 10 MG tablet Take 1 tablet (10  mg total) by mouth every 6 (six) hours as needed for itching. Patient not taking: Reported on 12/04/2014 07/25/14   Roxy Horsemanobert Browning, PA-C  ibuprofen (ADVIL,MOTRIN) 600 MG tablet Take 1 tablet (600 mg total) by mouth every 6 (six) hours as needed for mild pain. Patient not taking: Reported on 12/04/2014 10/31/14   Charm RingsErin J Honig, MD  oxyCODONE-acetaminophen (PERCOCET/ROXICET) 5-325 MG per tablet Take 1 tablet by mouth every 6 (six) hours as needed for severe pain. Patient not taking: Reported on 12/04/2014  09/25/14   Catha GosselinHanna Patel-Mills, PA-C  permethrin (ELIMITE) 5 % cream Apply to entire body other than face - let sit for 14 hours then wash off, may repeat in 1 week if still having symptoms Patient not taking: Reported on 12/04/2014 07/25/14   Roxy Horsemanobert Browning, PA-C  predniSONE (DELTASONE) 50 MG tablet Take 1 pill daily for 5 days. Patient not taking: Reported on 12/04/2014 10/31/14   Charm RingsErin J Honig, MD   BP 139/80 mmHg  Pulse 51  Temp(Src) 98.2 F (36.8 C) (Oral)  Resp 12  Ht 5\' 3"  (1.6 m)  Wt 125 lb (56.7 kg)  BMI 22.15 kg/m2  SpO2 100%  LMP 03/09/2014 Physical Exam  Constitutional: She is oriented to person, place, and time. She appears well-developed.  HENT:  Head: Normocephalic.  Eyes: Conjunctivae and EOM are normal. No scleral icterus.  Neck: Neck supple. No thyromegaly present.  Cardiovascular: Normal rate and regular rhythm.  Exam reveals no gallop and no friction rub.   No murmur heard. Pulmonary/Chest: No stridor. She has no wheezes. She has no rales. She exhibits no tenderness.  Abdominal: She exhibits no distension. There is no tenderness. There is no rebound.  Musculoskeletal: Normal range of motion. She exhibits no edema.  Lymphadenopathy:    She has no cervical adenopathy.  Neurological: She is oriented to person, place, and time. She exhibits normal muscle tone. Coordination normal.  Skin: No rash noted. No erythema.  Psychiatric:  Moderately anxious    ED Course  Procedures (including critical care time) Labs Review Labs Reviewed  CBC WITH DIFFERENTIAL/PLATELET - Abnormal; Notable for the following:    WBC 2.6 (*)    Hemoglobin 11.8 (*)    HCT 35.9 (*)    Neutro Abs 1.4 (*)    All other components within normal limits  COMPREHENSIVE METABOLIC PANEL - Abnormal; Notable for the following:    BUN <5 (*)    Calcium 8.6 (*)    Total Protein 5.7 (*)    Albumin 3.2 (*)    All other components within normal limits  I-STAT CHEM 8, ED - Abnormal; Notable for the  following:    BUN <3 (*)    All other components within normal limits    Imaging Review Dg Chest 2 View  12/04/2014   CLINICAL DATA:  Left-sided chest pain.  EXAM: CHEST  2 VIEW  COMPARISON:  September 14, 2014.  FINDINGS: The heart size and mediastinal contours are within normal limits. Both lungs are clear. No pneumothorax or pleural effusion is noted. The visualized skeletal structures are unremarkable.  IMPRESSION: No active cardiopulmonary disease.   Electronically Signed   By: Lupita RaiderJames  Green Jr, M.D.   On: 12/04/2014 09:53     EKG Interpretation   Date/Time:  Tuesday Dec 04 2014 08:14:14 EDT Ventricular Rate:  67 PR Interval:  144 QRS Duration: 70 QT Interval:  415 QTC Calculation: 438 R Axis:   58 Text Interpretation:  Sinus rhythm Confirmed by Finlay Mills  MD, Jomarie Longs [id number])  on 12/04/2014 10:16:10 AM      MDM   Final diagnoses:  Pain  Anxiety    Anxiety,  tx with xanax and follow up    Bethann Berkshire, MD 12/04/14 1032

## 2014-12-04 NOTE — Discharge Instructions (Signed)
Follow-up as planned 

## 2014-12-04 NOTE — ED Notes (Signed)
Patient coming from home with left sided chest pain that has been ongoing x 3 weeks.  Associated SOB, light headed and dizziness.  [facility] and was being referred to [facility] Health for a full evaluation due to anxiety.  Triggers consist of stress.

## 2015-01-14 ENCOUNTER — Encounter (HOSPITAL_COMMUNITY): Payer: Self-pay | Admitting: Family Medicine

## 2015-01-14 ENCOUNTER — Emergency Department (HOSPITAL_COMMUNITY)
Admission: EM | Admit: 2015-01-14 | Discharge: 2015-01-14 | Disposition: A | Discharge status: Home / Self Care | Payer: BLUE CROSS/BLUE SHIELD | Attending: Emergency Medicine | Admitting: Emergency Medicine

## 2015-01-14 DIAGNOSIS — Z720 Tobacco use: Secondary | ICD-10-CM | POA: Insufficient documentation

## 2015-01-14 DIAGNOSIS — R251 Tremor, unspecified: Secondary | ICD-10-CM | POA: Insufficient documentation

## 2015-01-14 DIAGNOSIS — Z862 Personal history of diseases of the blood and blood-forming organs and certain disorders involving the immune mechanism: Secondary | ICD-10-CM | POA: Insufficient documentation

## 2015-01-14 DIAGNOSIS — R0789 Other chest pain: Secondary | ICD-10-CM | POA: Diagnosis not present

## 2015-01-14 DIAGNOSIS — F418 Other specified anxiety disorders: Secondary | ICD-10-CM | POA: Diagnosis not present

## 2015-01-14 DIAGNOSIS — F410 Panic disorder [episodic paroxysmal anxiety] without agoraphobia: Secondary | ICD-10-CM | POA: Insufficient documentation

## 2015-01-14 DIAGNOSIS — F419 Anxiety disorder, unspecified: Secondary | ICD-10-CM | POA: Diagnosis present

## 2015-01-14 DIAGNOSIS — R0602 Shortness of breath: Secondary | ICD-10-CM | POA: Diagnosis not present

## 2015-01-14 DIAGNOSIS — Z8742 Personal history of other diseases of the female genital tract: Secondary | ICD-10-CM | POA: Diagnosis not present

## 2015-01-14 MED ORDER — SERTRALINE HCL 50 MG PO TABS: 50.0000 mg | ORAL_TABLET | Freq: Every day | ORAL | Status: DC

## 2015-01-14 MED ORDER — ALPRAZOLAM 0.5 MG PO TABS: 0.5000 mg | ORAL_TABLET | Freq: Three times a day (TID) | ORAL | Status: DC | PRN

## 2015-01-14 NOTE — Discharge Instructions (Signed)
Read the information below.  Use the prescribed medication as directed.  Please discuss all new medications with your pharmacist.  You may return to the Emergency Department at any time for worsening condition or any new symptoms that concern you.   If you develop worsening chest pain, shortness of breath, fever, you pass out, or become weak or dizzy, return to the ER for a recheck.      Depression Depression refers to feeling sad, low, down in the dumps, blue, gloomy, or empty. In general, there are two kinds of depression: 1. Normal sadness or normal grief. This kind of depression is one that we all feel from time to time after upsetting life experiences, such as the loss of a job or the ending of a relationship. This kind of depression is considered normal, is short lived, and resolves within a few days to 2 weeks. Depression experienced after the loss of a loved one (bereavement) often lasts longer than 2 weeks but normally gets better with time. 2. Clinical depression. This kind of depression lasts longer than normal sadness or normal grief or interferes with your ability to function at home, at work, and in school. It also interferes with your personal relationships. It affects almost every aspect of your life. Clinical depression is an illness. Symptoms of depression can also be caused by conditions other than those mentioned above, such as:  Physical illness. Some physical illnesses, including underactive thyroid gland (hypothyroidism), severe anemia, specific types of cancer, diabetes, uncontrolled seizures, heart and lung problems, strokes, and chronic pain are commonly associated with symptoms of depression.  Side effects of some prescription medicine. In some people, certain types of medicine can cause symptoms of depression.  Substance abuse. Abuse of alcohol and illicit drugs can cause symptoms of depression. SYMPTOMS Symptoms of normal sadness and normal grief include the  following:  Feeling sad or crying for short periods of time.  Not caring about anything (apathy).  Difficulty sleeping or sleeping too much.  No longer able to enjoy the things you used to enjoy.  Desire to be by oneself all the time (social isolation).  Lack of energy or motivation.  Difficulty concentrating or remembering.  Change in appetite or weight.  Restlessness or agitation. Symptoms of clinical depression include the same symptoms of normal sadness or normal grief and also the following symptoms:  Feeling sad or crying all the time.  Feelings of guilt or worthlessness.  Feelings of hopelessness or helplessness.  Thoughts of suicide or the desire to harm yourself (suicidal ideation).  Loss of touch with reality (psychotic symptoms). Seeing or hearing things that are not real (hallucinations) or having false beliefs about your life or the people around you (delusions and paranoia). DIAGNOSIS  The diagnosis of clinical depression is usually based on how bad the symptoms are and how long they have lasted. Your health care provider will also ask you questions about your medical history and substance use to find out if physical illness, use of prescription medicine, or substance abuse is causing your depression. Your health care provider may also order blood tests. TREATMENT  Often, normal sadness and normal grief do not require treatment. However, sometimes antidepressant medicine is given for bereavement to ease the depressive symptoms until they resolve. The treatment for clinical depression depends on how bad the symptoms are but often includes antidepressant medicine, counseling with a mental health professional, or both. Your health care provider will help to determine what treatment is best for you.  Depression caused by physical illness usually goes away with appropriate medical treatment of the illness. If prescription medicine is causing depression, talk with your  health care provider about stopping the medicine, decreasing the dose, or changing to another medicine. Depression caused by the abuse of alcohol or illicit drugs goes away when you stop using these substances. Some adults need professional help in order to stop drinking or using drugs. SEEK IMMEDIATE MEDICAL CARE IF:  You have thoughts about hurting yourself or others.  You lose touch with reality (have psychotic symptoms).  You are taking medicine for depression and have a serious side effect. FOR MORE INFORMATION  National Alliance on Mental Illness: www.nami.[employer] of Mental Health: http://www.maynard.net/ Document Released: 07/10/2000 Document Revised: 11/27/2013 Document Reviewed: 10/12/2011 [facility] Patient Information 2015 [place], [place]. This information is not intended to replace advice given to you by your health care provider. Make sure you discuss any questions you have with your health care provider.   Behavioral Health Resources in the [facility]  Intensive Outpatient Programs: [facility]      [address] [place], [place] [phone] Both a day and evening program       [facility]     [address]        [place], [place] [address] [phone]         ADS: Alcohol & Drug Svcs [address] [place] [phone]  [facility] Mental Health ACCESS LINE: [phone] or [phone] [address] [place], [place] [address] [email or web address]  Mobile Crisis Teams:                                        [facility]rapeutic Alternatives         Mobile Crisis Care Unit [phone]             [facility] [address] Ginette Otto [phone]                                         Interventionist [address] DeE[address] [place] [phone]  Self-Help/Support Groups: Mental Health Assoc. of  [employer] of support groups [phone] (call for more info)  Narcotics Anonymous (NA) Caring Services [address] [place] - 2 meetings at this location  Residential Treatment Programs:  [facility]      [address]        [place]       [phone]         [facility] [address] [place] [address] [place], [place]  [phone]  [facility]  [address] [place], [place] [address] [phone] Admissions: 8am-3pm M-F  [facility]     [address]        [place], [place] [address]       [phone]         [facility] [address] Starling Manns [place], [place] [phone]  [facility] [address] [place], [place] [phone]  [facility] - Intensive Outpatient      [address]     [place], [place]       [phone]  [facility] (Addiction Recovery [facility]     [address] [place], [place] [phone] or [phone]  Residential Treatment Services (RTS)  [address] [place], [place] [phone]  [facility] [address]                                               [address] [place] [phone]  [facility] Resources: [phone]               General Therapy                                                Angie Fava, PhD        [address] [place], [place] [address]         [phone]   Insurance  [address] Behavioral   [address] [place], [place] [address] [phone]  [facility] [address] [place], [place] [address] [phone] Insurance/Medicaid/sponsorship through [facility] and Families                                              [address]                                        [place], [place] [address]    Therapy/tele-psych/case          [phone]          [facility] [address] [place], [place]  [address]  Adolescent/group home/case management [phone]                                           Creola Corn PhD       General therapy       Insurance   [phone]         Dr. Lolly Mustache Insurance [phone] M-F  [facility] Medicaid, sponsorship [phone]    Emergency Department Resource Guide 1) Find a Doctor and Pay Out of Pocket Although you won't have to find out who is covered by your insurance plan, it is a good idea to ask around and get recommendations. You will then need to call the office and see if the doctor you have chosen will accept you as a new patient and what types of options they offer for patients who are self-pay. Some doctors offer discounts or will set up payment plans for their patients who do not have insurance, but you will need to ask so you aren't surprised when you get to your appointment.  2) Contact Your Local Health Department Not all health departments have doctors that can see patients for sick visits, but  many do, so it is worth a call to see if yours does. If you don't know where your local health department is, you can check in your phone book. The CDC also has a tool to help you locate your state's health department, and many state websites also have listings of all of their local health departments.  3) Find a Walk-in Clinic If your illness is not likely to be very severe or complicated, you may want to try a walk in clinic. These are popping up all over the country in pharmacies, drugstores, and shopping centers. They're usually staffed by nurse practitioners or physician assistants that have been trained to treat common illnesses and complaints. They're usually fairly quick and inexpensive. However, if you have serious medical issues or chronic medical problems, these are probably not your best option.  No Primary Care Doctor: - Call Health  Connect at  [phone] - they can help you locate a primary care doctor that  accepts your insurance, provides certain services, etc. - Physician Referral Service- [phone]  Chronic Pain Problems: Organization         Address  Phone   Notes  [facility]  [phone] Patients need to be referred by their primary care doctor.   Medication Assistance: Organization         Address  Phone   Notes  [facility] Medication [facility] [address] [place] [place], [place] [address] [phone] --Must be a resident of [facility] -- Must have NO insurance coverage whatsoever (no Medicaid/ Medicare, etc.) -- The pt. MUST have a primary care doctor that directs their care regularly and follows them in the community   MedAssist  [phone]   [employer]  [phone]    Agencies that provide inexpensive medical care: Organization         Address  Phone   Notes  [facility]  [phone]   [facility]    [phone]   [facility] [address] [place], [place] [address] [phone]   [facility] [address] [place] [place] [phone]   Planned Parenthood    [phone]   [facility]    [phone]   Community [facility] and [employer]  [address], [place] Phone:  [phone], Fax:  [phone] Hours of Operation:  9 am - 6 pm, M-F.  Also accepts Medicaid/Medicare and self-pay.  [facility] for Children  [address], [place] Phone: [phone], Fax: [phone]. Hours of Operation:  8:30 am - 5:30 pm, M-F.  Also accepts Medicaid and self-pay.  [facility] [place] [address] [place] Phone: [phone]   [facility] [address] Natasha Bence [place], [place] [phone], Ext. [phone] Mondays & Thursdays: 7-9 AM.  First 15 patients are seen on a first come, first serve basis.     Medicaid-accepting [facility] Providers:  Organization         Address  Phone   Notes  [facility] [address], [place] [phone] Also accepts self-pay patients.  [facility] [address] Laurell Josephs [place], [place]  [phone]   [facility] [address], [place] [phone]   [facility] [address], [place] [phone]   Adrian Saran  Bland [address], [place]   [phone] Only accepts [place] patients after they have their name applied to their card.   Self-Pay (no insurance) in [facility]:  Organization         Address  Phone   Notes  Sickle Cell Patients, [facility] Internal Medicine [address] [place], [place] [phone]   [facility]gent Care [address] [place], [place] [phone]   [facility] [place]  [address], [place] [phone]   [facility]/Dr. Osei-Bonsu  [address] [place] or [address], [place] [phone] Phone number for both [place] and [place] locations is the same.  Urgent Medical and [facility] [address], [place] [phone]   [facility] [address], [place] or [address] [phone]   [facility] [address] [place] [phone], phone; [phone], fax Sees patients 1st and 3rd Saturday of every month.  Must not qualify for public or private insurance (i.e. Medicaid, Medicare, [place] Health Choice, Veterans' Benefits)  Household income should be no more than 200% of the poverty level The clinic cannot treat you if you are pregnant or think you are pregnant  Sexually transmitted diseases are not treated at the clinic.    Dental Care: Organization         Address  Phone  Notes  [facility]  Department of [facility] [address] [place], [place] [phone] Accepts children up to age 76 who are enrolled in [place] or [place] Health Choice; pregnant women with a Medicaid card; and children who have applied for Medicaid or [place] Health Choice, but were declined, whose parents can pay a reduced fee at time of service.  [facility] Department of [facility]  [address], [place] [phone] Accepts children up to age 27 who are enrolled in [place] or [place] Health Choice; pregnant women with a Medicaid card; and children who have applied for Medicaid or  Health Choice, but were declined, whose parents can pay a reduced fee at time of service.  [facility]  [address] [place], [place] [phone] Patients are seen by appointment only. Walk-ins are not accepted. [facility] will see patients 85 years of age and older. Monday - Tuesday (8am-5pm) Most Wednesdays (8:30-5pm) $30 per visit, cash only  [facility] Adult Dental Access PROGRAM  [address], [facility] [phone] Patients are seen by appointment only. Walk-ins are not accepted. [facility] will see patients 38 years of age and older. One Wednesday Evening (Monthly: Volunteer Based).  $30 per visit, cash only  [employer]  [phone] for adults; Children under age 28, call Graduate Pediatric Dentistry at [phone]. Children aged 68-14, please call [phone] to request a pediatric application.  Dental services are provided in all areas of dental care including fillings, crowns and bridges, complete and partial dentures, implants, gum treatment, root canals, and extractions. Preventive care is also provided. Treatment is provided to both adults and children. Patients are selected via a lottery and there is often a waiting list.   [facility] [place] [facility] [address] [place]  [phone]  [email or web address]   [facility] [address] [place], [place] [phone], Ext. [phone]  Second and Fourth Thursday of each month, opens at 6:30 AM; Clinic ends at 9 AM.  Patients are seen on a first-come first-served basis, and a limited number are seen during each clinic.   [facility]  [address] Ether Griffins [place], [place] [phone]   Eligibility Requirements You must have lived in [place], [place], or [place] counties for at least the last three months.   You cannot be eligible for state or federal sponsored [place], including [employer], [place], or [employer].   You generally cannot be eligible for healthcare insurance through your employer.    How to apply: Eligibility screenings are held every Tuesday and Wednesday afternoon from 1:00 pm until 4:00 pm. You do not need an appointment for the interview!  [facility] [address], [place], [place] [phone]   [facility] Health Department  [phone]   [facility] Health Department  [phone]   [facility] Health Department  [phone]    Behavioral Health Resources in the Community: Intensive Outpatient Programs Organization         Address  Phone  Notes  [facility] Services [address], [place], [place] [phone]   [facility] Outpatient [address] [place], [place] [phone]   ADS: Alcohol & Drug Svcs [address] [place], [place]  [phone]   [facility] Mental Health [address],  [place], [place] [phone] or [phone]   Substance Abuse Resources Organization         Address  Phone  Notes  Alcohol [facility] Drug Services  [phone]   [facility]  [phone]   [facility]  [phone]   Floydene Flock  [phone]   Residential & Outpatient Substance Abuse Program  [phone]   Psychological Services Organization          Address  Phone  Notes  [facility] Behavioral Health  [phone]   [facility] Services  [phone]   [facility] Mental Health [address] [place] [phone] or [phone]    Mobile Crisis Teams Organization         Address  Phone  Notes  [facility], Mobile Crisis Care Unit  [phone]   [facility]  [address] [place], [place] [phone]   Doristine Locks [address] [place] [phone]    Self-Help/Support Groups Organization         Address  Phone             Notes  Mental Health Assoc. of [place] - variety of support groups  336- I7437963 Call for more information  Narcotics Anonymous (NA), Caring Services [address], [employer] [place]  2 meetings at this location   Statistician         Address  Phone  Notes  [facility] [address],    [place]  [phone]   [facility]  [address], [place] [address], [place], [place] [phone]   [facility] Treatment Facility [address] [place], [place] [phone] Admissions: 8am-3pm M-F  [facility] [address]    [place], [place] [phone]   [facility] [address] Starling Manns [place], [place] [phone]   [facility] [address]  [place], [place] [phone]   [facility] - Intensive Outpatient [address] Laurell Josephs [address], [place], [place] [phone]   [facility] ([facility]iction Recovery Care [facility]oc[facility] ([phone]  [employer]  [place], [place] [phone] or (954)[phone]   [facility] Services (RTS) [address], [place], [place] [phone] Accepts Medicaid  [facility] [place] [address]  [place] [phone] Substance Abuse/Addiction Treatment   [facility] Organization         Address  Phone  Notes  [facility]  [phone]   Angie Fava, PhD [address] Ervin Knack [place], [place]   [phone] or ([phone]   [facility] Behavioral   [address] [place], [place] [phone]   [facility] [address] [place], [place] [phone] Insurance/Medicaid/sponsorship through [facility] and Families [address]                                    [place], [place] [phone] Therapy/tele-psych/case  [facility] [address], [place] [phone]    Dr. Lolly Mustache  [phone]   [facility]  [employer] [facility] Dept. 1) [address]  2) [address] [place] 3)  [address], [place] [phone]  [phone]   [facility] Child Abuse Hotline (316)107-9745 or [phone] (After Hours)

## 2015-01-14 NOTE — ED Notes (Signed)
Declined W/C at D/C and was escorted to lobby by RN. 

## 2015-01-14 NOTE — ED Notes (Signed)
Pt here for chest pain due to multiple anxiety attacks. sts she hasn't had any of her medication and unable to get in with her doctor. sts she has been under a lot of stress.

## 2015-01-14 NOTE — ED Notes (Signed)
Pt reports a HX of anxiety . Pt reports CP after multiple anxiety attacks.

## 2015-01-14 NOTE — ED Provider Notes (Signed)
CSN: [id number]     Arrival date & time 01/14/15  1520 History   First MD Initiated Contact with Patient 01/14/15 [phone]     Chief Complaint  Patient presents with  . Anxiety     (Consider location/radiation/quality/duration/timing/severity/associated sxs/prior Treatment) The history is provided by the patient.     Pt states she has had depression, anxiety, and panic attacks x years, has gotten much worse over the past few months.  She ran out of her anxiety medication 1 week ago, alprazolam.  Her symptoms involve feeling hot, SOB, shaking, and tightness in her chest.  This occurs only when she is very emotionally worked up and anxious.  The symptoms are never pleuritic or exertional.  Denies fevers, cough, hemoptysis, leg swelling, exogenous estrogen.  She has no symptoms when she is not emotionally upset or anxious.   Past Medical History  Diagnosis Date  . Environmental allergies   . Preterm labor   . Anemia   . Abnormal cervical Papanicolaou smear     cryo  . Pregnancy induced hypertension   . DUB (dysfunctional uterine bleeding)    Past Surgical History  Procedure Laterality Date  . Cryotherapy    . Tubal ligation    . Laparoscopic tubal ligation Bilateral 11/10/2013    Procedure: bilateral LAPAROSCOPIC TUBAL LIGATION with filshie clips;  Surgeon: Freddrick March. Tenny Craw, MD;  Location: [facility];  Service: Gynecology;  Laterality: Bilateral;  . Vaginal hysterectomy N/A 04/12/2014    Procedure: HYSTERECTOMY VAGINAL;  Surgeon: Adam Phenix, MD;  Location: [facility];  Service: Gynecology;  Laterality: N/A;  . Bilateral salpingectomy Bilateral 04/12/2014    Procedure: BILATERAL SALPINGECTOMY;  Surgeon: Adam Phenix, MD;  Location: [facility];  Service: Gynecology;  Laterality: Bilateral;   Family History  Problem Relation Age of Onset  . Hypertension Mother   . Hypertension Father   . Hyperlipidemia Father   . Diabetes Maternal Grandmother   . Cancer Paternal Grandmother     started in lung   . Hypertension Paternal Grandmother    History  Substance Use Topics  . Smoking status: Current Every Day Smoker -- 0.25 packs/day for 5 years    Types: Cigarettes  . Smokeless tobacco: Never Used     Comment: stopped with preg  . Alcohol Use: No   OB History    Gravida Para Term Preterm AB TAB SAB Ectopic Multiple Living   0 1 1 0 0 0 2     Review of Systems  All other systems reviewed and are negative.     Allergies  Hydrocodone and Naprosyn  Home Medications   Prior to Admission medications   Medication Sig Start Date End Date Taking? Authorizing Provider  ALPRAZolam Prudy Feeler) 0.5 MG tablet Take 1 tablet (0.5 mg total) by mouth 3 (three) times daily as needed for anxiety. 12/04/14   Bethann Berkshire, MD  hydrOXYzine (ATARAX/VISTARIL) 10 MG tablet Take 1 tablet (10 mg total) by mouth every 6 (six) hours as needed for itching. Patient not taking: Reported on 12/04/2014 07/25/14   Roxy Horseman, PA-C  ibuprofen (ADVIL,MOTRIN) 200 MG tablet Take 400 mg by mouth every 6 (six) hours as needed for mild pain or moderate pain.    Historical Provider, MD  ibuprofen (ADVIL,MOTRIN) 600 MG tablet Take 1 tablet (600 mg total) by mouth every 6 (six) hours as needed for mild pain. Patient not taking: Reported on 12/04/2014 10/31/14   Charm Rings, MD  oxyCODONE-acetaminophen (PERCOCET/ROXICET) [phone]  MG per tablet Take 1 tablet by mouth every 6 (six) hours as needed for severe pain. Patient not taking: Reported on 12/04/2014 09/25/14   Catha Gosselin, PA-C  permethrin (ELIMITE) 5 % cream Apply to entire body other than face - let sit for 14 hours then wash off, may repeat in 1 week if still having symptoms Patient not taking: Reported on 12/04/2014 07/25/14   Roxy Horseman, PA-C  predniSONE (DELTASONE) 50 MG tablet Take 1 pill daily for 5 days. Patient not taking: Reported on 12/04/2014 10/31/14   Charm Rings, MD   BP 141/80 mmHg  Pulse 75  Temp(Src) 99 F (37.2 C) (Oral)  Resp 16  Ht  5\' 3"  (1.6 m)  Wt 138 lb (62.596 kg)  BMI 24.45 kg/m2  SpO2 100%  LMP 03/09/2014 Physical Exam  Constitutional: She appears well-developed and well-nourished. No distress.  HENT:  Head: Normocephalic and atraumatic.  Neck: Neck supple.  Cardiovascular: Normal rate and regular rhythm.   Pulmonary/Chest: Effort normal and breath sounds normal. No respiratory distress. She has no wheezes. She has no rales.  Abdominal: Soft. She exhibits no distension. There is no tenderness. There is no rebound and no guarding.  Neurological: She is alert.  Skin: She is not diaphoretic.  Psychiatric: She exhibits a depressed mood.  tearful  Nursing note and vitals reviewed.   ED Course  Procedures (including critical care time) Labs Review Labs Reviewed - No data to display  Imaging Review No results found.   EKG Interpretation None      MDM   Final diagnoses:  Depression with anxiety    Afebrile, nontoxic patient with hx anxiety, depression.  She has no PCP and no psychiatrist.  Has been struggling to find a resource to help her with her problems and start her on medication.  She does have health insurance, did not realize a primary care provider could help her with these things.  She is interested in starting an antidepressant as well and realizes that she absolutely needs to follow up with primary care and that these medications all take time and can make her feel worse - she states she has a strong support system and will return or seek help if she ever feels unsafe.  Denies SI, HI.    D/C home with zoloft, xanax.  Pt advised she must establish an appointment in the next two weeks and should not start and stop zoloft.  Multiple resources provided.  Discussed result, findings, treatment, and follow up  with patient.  Pt given return precautions.  Pt verbalizes understanding and agrees with plan.         Trixie Dredge, PA-C 01/14/15 1704  Pricilla Loveless, MD 01/16/15 [phone]

## 2015-03-27 ENCOUNTER — Encounter (HOSPITAL_COMMUNITY): Payer: Self-pay | Admitting: Family Medicine

## 2015-03-27 ENCOUNTER — Emergency Department (HOSPITAL_COMMUNITY)
Admission: EM | Admit: 2015-03-27 | Discharge: 2015-03-27 | Disposition: A | Discharge status: Home / Self Care | Payer: BLUE CROSS/BLUE SHIELD | Attending: Emergency Medicine | Admitting: Emergency Medicine

## 2015-03-27 DIAGNOSIS — Z8751 Personal history of pre-term labor: Secondary | ICD-10-CM | POA: Diagnosis not present

## 2015-03-27 DIAGNOSIS — R51 Headache: Secondary | ICD-10-CM | POA: Insufficient documentation

## 2015-03-27 DIAGNOSIS — Z862 Personal history of diseases of the blood and blood-forming organs and certain disorders involving the immune mechanism: Secondary | ICD-10-CM | POA: Diagnosis not present

## 2015-03-27 DIAGNOSIS — Z79899 Other long term (current) drug therapy: Secondary | ICD-10-CM | POA: Insufficient documentation

## 2015-03-27 DIAGNOSIS — R42 Dizziness and giddiness: Secondary | ICD-10-CM | POA: Insufficient documentation

## 2015-03-27 DIAGNOSIS — R519 Headache, unspecified: Secondary | ICD-10-CM

## 2015-03-27 DIAGNOSIS — Z720 Tobacco use: Secondary | ICD-10-CM | POA: Insufficient documentation

## 2015-03-27 DIAGNOSIS — H53149 Visual discomfort, unspecified: Secondary | ICD-10-CM | POA: Diagnosis not present

## 2015-03-27 DIAGNOSIS — Z8742 Personal history of other diseases of the female genital tract: Secondary | ICD-10-CM | POA: Diagnosis not present

## 2015-03-27 MED ORDER — PROCHLORPERAZINE EDISYLATE 5 MG/ML IJ SOLN
5.0000 mg | Freq: Once | INTRAMUSCULAR | Status: AC
  Administered 2015-03-27: 5 mg via INTRAVENOUS
  Filled 2015-03-27: qty 2

## 2015-03-27 MED ORDER — TRAMADOL HCL 50 MG PO TABS: 50.0000 mg | ORAL_TABLET | Freq: Once | ORAL | Status: DC

## 2015-03-27 MED ORDER — DIPHENHYDRAMINE HCL 50 MG/ML IJ SOLN
25.0000 mg | Freq: Once | INTRAMUSCULAR | Status: AC
  Administered 2015-03-27: 25 mg via INTRAVENOUS
  Filled 2015-03-27: qty 1

## 2015-03-27 MED ORDER — TRAMADOL HCL 50 MG PO TABS
50.0000 mg | ORAL_TABLET | Freq: Once | ORAL | Status: AC
  Administered 2015-03-27: 50 mg via ORAL
  Filled 2015-03-27: qty 1

## 2015-03-27 MED ORDER — SODIUM CHLORIDE 0.9 % IV BOLUS (SEPSIS)
500.0000 mL | Freq: Once | INTRAVENOUS | Status: AC
  Administered 2015-03-27: 500 mL via INTRAVENOUS

## 2015-03-27 MED ORDER — KETOROLAC TROMETHAMINE 30 MG/ML IJ SOLN
30.0000 mg | Freq: Once | INTRAMUSCULAR | Status: AC
Start: 2015-03-27 — End: 2015-03-27
  Administered 2015-03-27: 30 mg via INTRAVENOUS
  Filled 2015-03-27: qty 1

## 2015-03-27 NOTE — Discharge Instructions (Signed)
You are having a headache. No specific cause was found today for your headache. It may have been a migraine or other cause of headache. Stress, anxiety, fatigue, and depression are common triggers for headaches. Your headache today does not appear to be life-threatening or require hospitalization, but often the exact cause of headaches is not determined in the emergency department. Therefore, follow-up with your doctor is very important to find out what may have caused your headache, and whether or not you need any further diagnostic testing or treatment. Sometimes headaches can appear benign (not harmful), but then more serious symptoms can develop which should prompt an immediate re-evaluation by your doctor or the emergency department. SEEK MEDICAL ATTENTION IF: You develop possible problems with medications prescribed.  The medications don't resolve your headache, if it recurs , or if you have multiple episodes of vomiting or can't take fluids. You have a change from the usual headache. RETURN IMMEDIATELY IF you develop a sudden, severe headache or confusion, become poorly responsive or faint, develop a fever above 100.47F or problem breathing, have a change in speech, vision, swallowing, or understanding, or develop new weakness, numbness, tingling, incoordination, or have a seizure.   Headaches, Frequently Asked Questions MIGRAINE HEADACHES Q: What is migraine? What causes it? How can I treat it? A: Generally, migraine headaches begin as a dull ache. Then they develop into a constant, throbbing, and pulsating pain. You may experience pain at the temples. You may experience pain at the front or back of one or both sides of the head. The pain is usually accompanied by a combination of:  Nausea.  Vomiting.  Sensitivity to light and noise. Some people (about 15%) experience an aura (see below) before an attack. The cause of migraine is believed to be chemical reactions in the brain. Treatment for  migraine may include over-the-counter or prescription medications. It may also include self-help techniques. These include relaxation training and biofeedback.  Q: What is an aura? A: About 15% of people with migraine get an "aura". This is a sign of neurological symptoms that occur before a migraine headache. You may see wavy or jagged lines, dots, or flashing lights. You might experience tunnel vision or blind spots in one or both eyes. The aura can include visual or auditory hallucinations (something imagined). It may include disruptions in smell (such as strange odors), taste or touch. Other symptoms include:  Numbness.  A "pins and needles" sensation.  Difficulty in recalling or speaking the correct word. These neurological events may last as long as 60 minutes. These symptoms will fade as the headache begins. Q: What is a trigger? A: Certain physical or environmental factors can lead to or "trigger" a migraine. These include:  Foods.  Hormonal changes.  Weather.  Stress. It is important to remember that triggers are different for everyone. To help prevent migraine attacks, you need to figure out which triggers affect you. Keep a headache diary. This is a good way to track triggers. The diary will help you talk to your healthcare professional about your condition. Q: Does weather affect migraines? A: Bright sunshine, hot, humid conditions, and drastic changes in barometric pressure may lead to, or "trigger," a migraine attack in some people. But studies have shown that weather does not act as a trigger for everyone with migraines. Q: What is the link between migraine and hormones? A: Hormones start and regulate many of your body's functions. Hormones keep your body in balance within a constantly changing environment. The  levels of hormones in your body are unbalanced at times. Examples are during menstruation, pregnancy, or menopause. That can lead to a migraine attack. In fact, about  three quarters of all women with migraine report that their attacks are related to the menstrual cycle.  Q: Is there an increased risk of stroke for migraine sufferers? A: The likelihood of a migraine attack causing a stroke is very remote. That is not to say that migraine sufferers cannot have a stroke associated with their migraines. In persons under age 29, the most common associated factor for stroke is migraine headache. But over the course of a person's normal life span, the occurrence of migraine headache may actually be associated with a reduced risk of dying from cerebrovascular disease due to stroke.  Q: What are acute medications for migraine? A: Acute medications are used to treat the pain of the headache after it has started. Examples over-the-counter medications, NSAIDs, ergots, and triptans.  Q: What are the triptans? A: Triptans are the newest class of abortive medications. They are specifically targeted to treat migraine. Triptans are vasoconstrictors. They moderate some chemical reactions in the brain. The triptans work on receptors in your brain. Triptans help to restore the balance of a neurotransmitter called serotonin. Fluctuations in levels of serotonin are thought to be a main cause of migraine.  Q: Are over-the-counter medications for migraine effective? A: Over-the-counter, or "OTC," medications may be effective in relieving mild to moderate pain and associated symptoms of migraine. But you should see your caregiver before beginning any treatment regimen for migraine.  Q: What are preventive medications for migraine? A: Preventive medications for migraine are sometimes referred to as "prophylactic" treatments. They are used to reduce the frequency, severity, and length of migraine attacks. Examples of preventive medications include antiepileptic medications, antidepressants, beta-blockers, calcium channel blockers, and NSAIDs (nonsteroidal anti-inflammatory drugs). Q: Why are  anticonvulsants used to treat migraine? A: During the past few years, there has been an increased interest in antiepileptic drugs for the prevention of migraine. They are sometimes referred to as "anticonvulsants". Both epilepsy and migraine may be caused by similar reactions in the brain.  Q: Why are antidepressants used to treat migraine? A: Antidepressants are typically used to treat people with depression. They may reduce migraine frequency by regulating chemical levels, such as serotonin, in the brain.  Q: What alternative therapies are used to treat migraine? A: The term "alternative therapies" is often used to describe treatments considered outside the scope of conventional Western medicine. Examples of alternative therapy include acupuncture, acupressure, and yoga. Another common alternative treatment is herbal therapy. Some herbs are believed to relieve headache pain. Always discuss alternative therapies with your caregiver before proceeding. Some herbal products contain arsenic and other toxins. TENSION HEADACHES Q: What is a tension-type headache? What causes it? How can I treat it? A: Tension-type headaches occur randomly. They are often the result of temporary stress, anxiety, fatigue, or anger. Symptoms include soreness in your temples, a tightening band-like sensation around your head (a "vice-like" ache). Symptoms can also include a pulling feeling, pressure sensations, and contracting head and neck muscles. The headache begins in your forehead, temples, or the back of your head and neck. Treatment for tension-type headache may include over-the-counter or prescription medications. Treatment may also include self-help techniques such as relaxation training and biofeedback. CLUSTER HEADACHES Q: What is a cluster headache? What causes it? How can I treat it? A: Cluster headache gets its name because the attacks come in  groups. The pain arrives with little, if any, warning. It is usually on  one side of the head. A tearing or bloodshot eye and a runny nose on the same side of the headache may also accompany the pain. Cluster headaches are believed to be caused by chemical reactions in the brain. They have been described as the most severe and intense of any headache type. Treatment for cluster headache includes prescription medication and oxygen. SINUS HEADACHES Q: What is a sinus headache? What causes it? How can I treat it? A: When a cavity in the bones of the face and skull (a sinus) becomes inflamed, the inflammation will cause localized pain. This condition is usually the result of an allergic reaction, a tumor, or an infection. If your headache is caused by a sinus blockage, such as an infection, you will probably have a fever. An x-ray will confirm a sinus blockage. Your caregiver's treatment might include antibiotics for the infection, as well as antihistamines or decongestants.  REBOUND HEADACHES Q: What is a rebound headache? What causes it? How can I treat it? A: A pattern of taking acute headache medications too often can lead to a condition known as "rebound headache." A pattern of taking too much headache medication includes taking it more than 2 days per week or in excessive amounts. That means more than the label or a caregiver advises. With rebound headaches, your medications not only stop relieving pain, they actually begin to cause headaches. Doctors treat rebound headache by tapering the medication that is being overused. Sometimes your caregiver will gradually substitute a different type of treatment or medication. Stopping may be a challenge. Regularly overusing a medication increases the potential for serious side effects. Consult a caregiver if you regularly use headache medications more than 2 days per week or more than the label advises. ADDITIONAL QUESTIONS AND ANSWERS Q: What is biofeedback? A: Biofeedback is a self-help treatment. Biofeedback uses special equipment  to monitor your body's involuntary physical responses. Biofeedback monitors:  Breathing.  Pulse.  Heart rate.  Temperature.  Muscle tension.  Brain activity. Biofeedback helps you refine and perfect your relaxation exercises. You learn to control the physical responses that are related to stress. Once the technique has been mastered, you do not need the equipment any more. Q: Are headaches hereditary? A: Four out of five (80%) of people that suffer report a family history of migraine. Scientists are not sure if this is genetic or a family predisposition. Despite the uncertainty, a child has a 50% chance of having migraine if one parent suffers. The child has a 75% chance if both parents suffer.  Q: Can children get headaches? A: By the time they reach high school, most young people have experienced some type of headache. Many safe and effective approaches or medications can prevent a headache from occurring or stop it after it has begun.  Q: What type of doctor should I see to diagnose and treat my headache? A: Start with your primary caregiver. Discuss his or her experience and approach to headaches. Discuss methods of classification, diagnosis, and treatment. Your caregiver may decide to recommend you to a headache specialist, depending upon your symptoms or other physical conditions. Having diabetes, allergies, etc., may require a more comprehensive and inclusive approach to your headache. The [employer] will provide, upon request, a list of [facility] physician members in your state. Document Released: 10/03/2003 Document Revised: 10/05/2011 Document Reviewed: 03/12/2008 [facility] Patient Information 2015 [place], [place]. This information is  not intended to replace advice given to you by your health care provider. Make sure you discuss any questions you have with your health care provider. ° °

## 2015-03-27 NOTE — ED Provider Notes (Signed)
CSN: [id number]     Arrival date & time 03/27/15  1239 History  This chart was scribed for non-physician practitioner, Arthor Captain, PA-C, working with Benjiman Core, MD, by Budd Palmer ED Scribe. This patient was seen in room TR02C/TR02C and the patient's care was started at 2:07 PM    Chief Complaint  Patient presents with  . Headache   The history is provided by the patient. No language interpreter was used.   HPI Comments: Brenda Blankenship is a 29 y.o. female who presents to the Emergency Department complaining of a gradual onset, worsening headache to the bilateral temples onset 3 days ago. She notes it feels more painful on the left side. She reports associated dizziness with rapid standing up/sitting down, and left eye twitching. She reports being under a lot of stress recently. She notes exacerbation of the HA with light and loud noises. She has tried ibuprofen (OTC and 600 mg), baby aspirin, and tylenol with little relief. . She states she is unsure whether she clenches her jaw at night. Pt denies loss of appetite and nausea.Denies UL throbbing, N/V, visual changes, stiff neck, neck pain, rash,Fever, or "thunderclap" onset. HX previous HAs, today is the worst.   Past Medical History  Diagnosis Date  . Environmental allergies   . Preterm labor   . Anemia   . Abnormal cervical Papanicolaou smear     cryo  . Pregnancy induced hypertension   . DUB (dysfunctional uterine bleeding)    Past Surgical History  Procedure Laterality Date  . Cryotherapy    . Tubal ligation    . Laparoscopic tubal ligation Bilateral 11/10/2013    Procedure: bilateral LAPAROSCOPIC TUBAL LIGATION with filshie clips;  Surgeon: Freddrick March. Tenny Craw, MD;  Location: [facility];  Service: Gynecology;  Laterality: Bilateral;  . Vaginal hysterectomy N/A 04/12/2014    Procedure: HYSTERECTOMY VAGINAL;  Surgeon: Adam Phenix, MD;  Location: [facility];  Service: Gynecology;  Laterality: N/A;  . Bilateral salpingectomy  Bilateral 04/12/2014    Procedure: BILATERAL SALPINGECTOMY;  Surgeon: Adam Phenix, MD;  Location: [facility];  Service: Gynecology;  Laterality: Bilateral;   Family History  Problem Relation Age of Onset  . Hypertension Mother   . Hypertension Father   . Hyperlipidemia Father   . Diabetes Maternal Grandmother   . Cancer Paternal Grandmother     started in lung  . Hypertension Paternal Grandmother    Social History  Substance Use Topics  . Smoking status: Current Every Day Smoker -- 0.25 packs/day for 5 years    Types: Cigarettes  . Smokeless tobacco: Never Used     Comment: stopped with preg  . Alcohol Use: No   OB History    Gravida Para Term Preterm AB TAB SAB Ectopic Multiple Living   0 1 1 0 0 0 2     Review of Systems  Constitutional: Negative for appetite change.  Eyes: Positive for photophobia.  Gastrointestinal: Negative for nausea.  Neurological: Positive for dizziness and headaches.   A complete 10 system review of systems was obtained and all systems are negative except as noted in the HPI and PMH.    Allergies  Hydrocodone and Naprosyn  Home Medications   Prior to Admission medications   Medication Sig Start Date End Date Taking? Authorizing Provider  ALPRAZolam Prudy Feeler) 0.5 MG tablet Take 1 tablet (0.5 mg total) by mouth 3 (three) times daily as needed for anxiety. 01/14/15   Trixie Dredge, PA-C  hydrOXYzine (ATARAX/VISTARIL) 10 MG tablet Take 1 tablet (10 mg total) by mouth every 6 (six) hours as needed for itching. Patient not taking: Reported on 12/04/2014 07/25/14   Roxy Horseman, PA-C  ibuprofen (ADVIL,MOTRIN) 200 MG tablet Take 400 mg by mouth every 6 (six) hours as needed for mild pain or moderate pain.    Historical Provider, MD  ibuprofen (ADVIL,MOTRIN) 600 MG tablet Take 1 tablet (600 mg total) by mouth every 6 (six) hours as needed for mild pain. Patient not taking: Reported on 12/04/2014 10/31/14   Charm Rings, MD  oxyCODONE-acetaminophen  (PERCOCET/ROXICET) 5-325 MG per tablet Take 1 tablet by mouth every 6 (six) hours as needed for severe pain. Patient not taking: Reported on 12/04/2014 09/25/14   Catha Gosselin, PA-C  permethrin (ELIMITE) 5 % cream Apply to entire body other than face - let sit for 14 hours then wash off, may repeat in 1 week if still having symptoms Patient not taking: Reported on 12/04/2014 07/25/14   Roxy Horseman, PA-C  predniSONE (DELTASONE) 50 MG tablet Take 1 pill daily for 5 days. Patient not taking: Reported on 12/04/2014 10/31/14   Charm Rings, MD  sertraline (ZOLOFT) 50 MG tablet Take 1 tablet (50 mg total) by mouth daily. 01/14/15   Trixie Dredge, PA-C  traMADol (ULTRAM) 50 MG tablet Take 1 tablet (50 mg total) by mouth once. 03/27/15   Prajwal Fellner, PA-C   BP 139/87 mmHg  Pulse 54  Temp(Src) 98.7 F (37.1 C) (Oral)  Resp 16  Ht 5\' 3"  (1.6 m)  Wt 129 lb 11.2 oz (58.832 kg)  BMI 22.98 kg/m2  SpO2 100%  LMP 03/09/2014 Physical Exam  Constitutional: She is oriented to person, place, and time. She appears well-developed and well-nourished. No distress.  HENT:  Head: Normocephalic and atraumatic.  Mouth/Throat: Oropharynx is clear and moist.  Eyes: Conjunctivae and EOM are normal. Pupils are equal, round, and reactive to light. No scleral icterus.  No horizontal, vertical or rotational nystagmus  Neck: Normal range of motion. Neck supple.  Full active and passive ROM without pain No midline or paraspinal tenderness No nuchal rigidity or meningeal signs  Cardiovascular: Normal rate, regular rhythm and intact distal pulses.   Pulmonary/Chest: Effort normal and breath sounds normal. No respiratory distress. She has no wheezes. She has no rales.  Abdominal: Soft. Bowel sounds are normal. There is no tenderness. There is no rebound and no guarding.  Musculoskeletal: Normal range of motion.  Lymphadenopathy:    She has no cervical adenopathy.  Neurological: She is alert and oriented to person,  place, and time. She has normal reflexes. No cranial nerve deficit. She exhibits normal muscle tone. Coordination normal.  Mental Status:  Alert, oriented, thought content appropriate. Speech fluent without evidence of aphasia. Able to follow 2 step commands without difficulty.  Cranial Nerves:  II:  Peripheral visual fields grossly normal, pupils equal, round, reactive to light III,IV, VI: ptosis not present, extra-ocular motions intact bilaterally  V,VII: smile symmetric, facial light touch sensation equal VIII: hearing grossly normal bilaterally  IX,X: midline uvula rise  XI: bilateral shoulder shrug equal and strong XII: midline tongue extension  Motor:  5/5 in upper and lower extremities bilaterally including strong and equal grip strength and dorsiflexion/plantar flexion Sensory: Pinprick and light touch normal in all extremities.  Deep Tendon Reflexes: 2+ and symmetric  Cerebellar: normal finger-to-nose with bilateral upper extremities Gait: normal gait and balance CV: distal pulses palpable throughout   Skin: Skin is  warm and dry. No rash noted. She is not diaphoretic.  Psychiatric: She has a normal mood and affect. Her behavior is normal. Judgment and thought content normal.  Nursing note and vitals reviewed.   ED Course  Procedures  DIAGNOSTIC STUDIES: Oxygen Saturation is 100% on RA, normal by my interpretation.    COORDINATION OF CARE: 2:13 PM - Discussed plans to order a low-dose migraine cocktail. Pt advised of plan for treatment and pt agrees.  Labs Review Labs Reviewed - No data to display  Imaging Review No results found. I have personally reviewed and evaluated these images and lab results as part of my medical decision-making.   EKG Interpretation None      MDM   Final diagnoses:  Bad headache    Pt HA treated and improved while in ED.  Presentation is like pts typical HA and non concerning for [facility], ICH, Meningitis, or temporal arteritis. Pt is  afebrile with no focal neuro deficits, nuchal rigidity, or change in vision. Pt is to follow up with PCP to discuss prophylactic medication. Pt verbalizes understanding and is agreeable with plan to dc.    I personally performed the services described in this documentation, which was scribed in my presence. The recorded information has been reviewed and is accurate.      Arthor Captain, PA-C 04/04/15 1610  Benjiman Core, MD 04/04/15 [phone]

## 2015-03-27 NOTE — ED Notes (Signed)
PT reports light sensitivity and Lt eye twitching.

## 2015-03-27 NOTE — ED Notes (Signed)
Pt here for headache x 4 days. sts hx of same with hypertension. sts pain in temple area. sts left eye twitching.

## 2015-08-21 ENCOUNTER — Emergency Department (HOSPITAL_COMMUNITY)
Admission: EM | Admit: 2015-08-21 | Discharge: 2015-08-21 | Disposition: A | Discharge status: Home / Self Care | Payer: BLUE CROSS/BLUE SHIELD | Attending: Emergency Medicine | Admitting: Emergency Medicine

## 2015-08-21 ENCOUNTER — Encounter (HOSPITAL_COMMUNITY): Payer: Self-pay | Admitting: Emergency Medicine

## 2015-08-21 DIAGNOSIS — Z862 Personal history of diseases of the blood and blood-forming organs and certain disorders involving the immune mechanism: Secondary | ICD-10-CM | POA: Insufficient documentation

## 2015-08-21 DIAGNOSIS — Z8751 Personal history of pre-term labor: Secondary | ICD-10-CM | POA: Insufficient documentation

## 2015-08-21 DIAGNOSIS — N760 Acute vaginitis: Secondary | ICD-10-CM | POA: Insufficient documentation

## 2015-08-21 DIAGNOSIS — N730 Acute parametritis and pelvic cellulitis: Secondary | ICD-10-CM | POA: Insufficient documentation

## 2015-08-21 DIAGNOSIS — F17210 Nicotine dependence, cigarettes, uncomplicated: Secondary | ICD-10-CM | POA: Insufficient documentation

## 2015-08-21 DIAGNOSIS — B9689 Other specified bacterial agents as the cause of diseases classified elsewhere: Secondary | ICD-10-CM

## 2015-08-21 DIAGNOSIS — Z711 Person with feared health complaint in whom no diagnosis is made: Secondary | ICD-10-CM

## 2015-08-21 DIAGNOSIS — Z79899 Other long term (current) drug therapy: Secondary | ICD-10-CM | POA: Insufficient documentation

## 2015-08-21 LAB — COMPREHENSIVE METABOLIC PANEL
ALT: 9 U/L — ABNORMAL LOW (ref 14–54)
AST: 15 U/L (ref 15–41)
Albumin: 4.3 g/dL (ref 3.5–5.0)
Alkaline Phosphatase: 42 U/L (ref 38–126)
Anion gap: 6 (ref 5–15)
BUN: 7 mg/dL (ref 6–20)
CO2: 26 mmol/L (ref 22–32)
Calcium: 8.8 mg/dL — ABNORMAL LOW (ref 8.9–10.3)
Chloride: 105 mmol/L (ref 101–111)
Creatinine, Ser: 0.82 mg/dL (ref 0.44–1.00)
GFR calc Af Amer: >60 mL/min (ref >60)
GFR calc non Af Amer: >60 mL/min (ref >60)
Glucose, Bld: 89 mg/dL (ref 65–99)
Potassium: 3.7 mmol/L (ref 3.5–5.1)
Sodium: 137 mmol/L (ref 135–145)
Total Bilirubin: 0.8 mg/dL (ref 0.3–1.2)
Total Protein: 7.1 g/dL (ref 6.5–8.1)

## 2015-08-21 LAB — URINALYSIS, ROUTINE W REFLEX MICROSCOPIC
Bilirubin Urine: NEGATIVE
Glucose, UA: NEGATIVE mg/dL
Hgb urine dipstick: NEGATIVE
Ketones, ur: NEGATIVE mg/dL
Leukocytes, UA: NEGATIVE
Nitrite: NEGATIVE
Protein, ur: NEGATIVE mg/dL
Specific Gravity, Urine: 1.023 (ref 1.005–1.030)
pH: 6 (ref 5.0–8.0)

## 2015-08-21 LAB — CBC
HCT: 38.7 % (ref 36.0–46.0)
Hemoglobin: 12.5 g/dL (ref 12.0–15.0)
MCH: 28.2 pg (ref 26.0–34.0)
MCHC: 32.3 g/dL (ref 30.0–36.0)
MCV: 87.2 fL (ref 78.0–100.0)
Platelets: 225 10*3/uL (ref 150–400)
RBC: 4.44 MIL/uL (ref 3.87–5.11)
RDW: 13.7 % (ref 11.5–15.5)
WBC: 3.3 10*3/uL — ABNORMAL LOW (ref 4.0–10.5)

## 2015-08-21 LAB — WET PREP, GENITAL
Sperm: NONE SEEN
Trich, Wet Prep: NONE SEEN
WBC, Wet Prep HPF POC: NONE SEEN
Yeast Wet Prep HPF POC: NONE SEEN

## 2015-08-21 LAB — LIPASE, BLOOD: Lipase: 23 U/L (ref 11–51)

## 2015-08-21 MED ORDER — METRONIDAZOLE 500 MG PO TABS: 500.0000 mg | ORAL_TABLET | Freq: Two times a day (BID) | ORAL | Status: DC

## 2015-08-21 MED ORDER — DOXYCYCLINE HYCLATE 100 MG PO CAPS: 100.0000 mg | ORAL_CAPSULE | Freq: Two times a day (BID) | ORAL | Status: DC

## 2015-08-21 MED ORDER — CEFTRIAXONE SODIUM 250 MG IJ SOLR
250.0000 mg | Freq: Once | INTRAMUSCULAR | Status: AC
  Administered 2015-08-21: 250 mg via INTRAMUSCULAR
  Filled 2015-08-21: qty 250

## 2015-08-21 MED ORDER — AZITHROMYCIN 250 MG PO TABS
1000.0000 mg | ORAL_TABLET | Freq: Once | ORAL | Status: AC
  Administered 2015-08-21: 1000 mg via ORAL
  Filled 2015-08-21: qty 4

## 2015-08-21 MED ORDER — OXYCODONE-ACETAMINOPHEN 5-325 MG PO TABS
2.0000 | ORAL_TABLET | Freq: Once | ORAL | Status: AC
  Administered 2015-08-21: 2 via ORAL
  Filled 2015-08-21: qty 2

## 2015-08-21 NOTE — Discharge Instructions (Signed)
Bacterial Vaginosis °Bacterial vaginosis is a vaginal infection that occurs when the normal balance of bacteria in the vagina is disrupted. It results from an overgrowth of certain bacteria. This is the most common vaginal infection in women of childbearing age. Treatment is important to prevent complications, especially in pregnant women, as it can cause a premature delivery. °CAUSES  °Bacterial vaginosis is caused by an increase in harmful bacteria that are normally present in smaller amounts in the vagina. Several different kinds of bacteria can cause bacterial vaginosis. However, the reason that the condition develops is not fully understood. °RISK FACTORS °Certain activities or behaviors can put you at an increased risk of developing bacterial vaginosis, including: °· Having a new sex partner or multiple sex partners. °· Douching. °· Using an intrauterine device (IUD) for contraception. °Women do not get bacterial vaginosis from toilet seats, bedding, swimming pools, or contact with objects around them. °SIGNS AND SYMPTOMS  °Some women with bacterial vaginosis have no signs or symptoms. Common symptoms include: °· Grey vaginal discharge. °· A fishlike odor with discharge, especially after sexual intercourse. °· Itching or burning of the vagina and vulva. °· Burning or pain with urination. °DIAGNOSIS  °Your health care provider will take a medical history and examine the vagina for signs of bacterial vaginosis. A sample of vaginal fluid may be taken. Your health care provider will look at this sample under a microscope to check for bacteria and abnormal cells. A vaginal pH test may also be done.  °TREATMENT  °Bacterial vaginosis may be treated with antibiotic medicines. These may be given in the form of a pill or a vaginal cream. A second round of antibiotics may be prescribed if the condition comes back after treatment. Because bacterial vaginosis increases your risk for sexually transmitted diseases, getting  treated can help reduce your risk for chlamydia, gonorrhea, HIV, and herpes. °HOME CARE INSTRUCTIONS  °· Only take over-the-counter or prescription medicines as directed by your health care provider. °· If antibiotic medicine was prescribed, take it as directed. Make sure you finish it even if you start to feel better. °· Tell all sexual partners that you have a vaginal infection. They should see their health care provider and be treated if they have problems, such as a mild rash or itching. °· During treatment, it is important that you follow these instructions: °· Avoid sexual activity or use condoms correctly. °· Do not douche. °· Avoid alcohol as directed by your health care provider. °· Avoid breastfeeding as directed by your health care provider. °SEEK MEDICAL CARE IF:  °· Your symptoms are not improving after 3 days of treatment. °· You have increased discharge or pain. °· You have a fever. °MAKE SURE YOU:  °· Understand these instructions. °· Will watch your condition. °· Will get help right away if you are not doing well or get worse. °FOR MORE INFORMATION  °Centers for Disease Control and Prevention, Division of STD Prevention: www.cdc.gov/std °American Sexual Health Association (ASHA): www.ashastd.org  °  °This information is not intended to replace advice given to you by your health care provider. Make sure you discuss any questions you have with your health care provider. °  °Document Released: 07/13/2005 Document Revised: 08/03/2014 Document Reviewed: 02/22/2013 °Elsevier Interactive Patient Education ©2016 [employer] °Pelvic Inflammatory Disease °Pelvic inflammatory disease (PID) refers to an infection in some or all of the female organs. The infection can be in the uterus, ovaries, fallopian tubes, or the surrounding tissues in the pelvis. PID   can cause abdominal or pelvic pain that comes on suddenly (acute pelvic pain). PID is a serious infection because it can lead to lasting (chronic) pelvic  pain or the inability to have children (infertility). °CAUSES °This condition is most often caused by an infection that is spread during sexual contact. However, the infection can also be caused by the normal bacteria that are found in the vaginal tissues if these bacteria travel upward into the reproductive organs. PID can also occur following: °· The birth of a baby. °· A miscarriage. °· An abortion. °· Major pelvic surgery. °· The use of an intrauterine device (IUD). °· A sexual assault. °RISK FACTORS °This condition is more likely to develop in women who: °· Are younger than 30 years of age. °· Are sexually active at a young age. °· Use nonbarrier contraception. °· Have multiple sexual partners. °· Have sex with someone who has symptoms of an STD (sexually transmitted disease). °· Use oral contraception. °At times, certain behaviors can also increase the possibility of getting PID, such as: °· Using a vaginal douche. °· Having an IUD in place. °SYMPTOMS °Symptoms of this condition include: °· Abdominal or pelvic pain. °· Fever. °· Chills. °· Abnormal vaginal discharge. °· Abnormal uterine bleeding. °· Unusual pain shortly after the end of a menstrual period. °· Painful urination. °· Pain with sexual intercourse. °· Nausea and vomiting. °DIAGNOSIS °To diagnose this condition, your health care provider will do a physical exam and take your medical history. A pelvic exam typically reveals great tenderness in the uterus and the surrounding pelvic tissues. You may also have tests, such as: °· Lab tests, including a pregnancy test, blood tests, and urine test. °· Culture tests of the vagina and cervix to check for an STD. °· Ultrasound. °· A laparoscopic procedure to look inside the pelvis. °· Examining vaginal secretions under a microscope. °TREATMENT °Treatment for this condition may involve one or more approaches. °· Antibiotic medicines may be prescribed to be taken by mouth. °· Sexual partners may need to be  treated if the infection is caused by an STD. °· For more severe cases, hospitalization may be needed to give antibiotics directly into a vein through an IV tube. °· Surgery may be needed if other treatments do not help, but this is rare. °It may take weeks until you are completely well. If you are diagnosed with PID, you should also be checked for human immunodeficiency virus (HIV). Your health care provider may test you for infection again 3 months after treatment. You should not have unprotected sex. °HOME CARE INSTRUCTIONS °· Take over-the-counter and prescription medicines only as told by your health care provider. °· If you were prescribed an antibiotic medicine, take it as told by your health care provider. Do not stop taking the antibiotic even if you start to feel better. °· Do not have sexual intercourse until treatment is completed or as told by your health care provider. If PID is confirmed, your recent sexual partners will need treatment, especially if you had unprotected sex. °· Keep all follow-up visits as told by your health care provider. This is important. °SEEK MEDICAL CARE IF: °· You have increased or abnormal vaginal discharge. °· Your pain does not improve. °· You vomit. °· You have a fever. °· You cannot tolerate your medicines. °· Your partner has an STD. °· You have pain when you urinate. °SEEK IMMEDIATE MEDICAL CARE IF: °· You have increased abdominal or pelvic pain. °· You have chills. °· Your   symptoms are not better in 72 hours even with treatment. °  °This information is not intended to replace advice given to you by your health care provider. Make sure you discuss any questions you have with your health care provider. °  °Document Released: 07/13/2005 Document Revised: 04/03/2015 Document Reviewed: 08/20/2014 °Elsevier Interactive Patient Education ©2016 [employer] ° °

## 2015-08-21 NOTE — ED Provider Notes (Signed)
CSN: [id number]     Arrival date & time 08/21/15  1456 History   First MD Initiated Contact with Patient 08/21/15 1732     Chief Complaint  Patient presents with  . Abdominal Pain     (Consider location/radiation/quality/duration/timing/severity/associated sxs/prior Treatment) HPI Comments: Patient presents to the emergency department with chief complaint of pelvic pain. She states that she has been having the symptoms intermittently for about a month. She reports that she was called by her husband's girlfriend and told to be checked for STD. She reports that she has had some vaginal discharge. She has had a prior hysterectomy and bilateral salpingectomy.  She reports that she thought she had a urinary tract infection, so she took some leftover antibiotics with no relief. She denies any chest pain, shortness of breath, nausea, vomiting, or diarrhea. There are no aggravating or alleviating factors.  The history is provided by the patient. No language interpreter was used.    Past Medical History  Diagnosis Date  . Environmental allergies   . Preterm labor   . Anemia   . Abnormal cervical Papanicolaou smear     cryo  . Pregnancy induced hypertension   . DUB (dysfunctional uterine bleeding)    Past Surgical History  Procedure Laterality Date  . Cryotherapy    . Tubal ligation    . Laparoscopic tubal ligation Bilateral 11/10/2013    Procedure: bilateral LAPAROSCOPIC TUBAL LIGATION with filshie clips;  Surgeon: Freddrick March. Tenny Craw, MD;  Location: [facility];  Service: Gynecology;  Laterality: Bilateral;  . Vaginal hysterectomy N/A 04/12/2014    Procedure: HYSTERECTOMY VAGINAL;  Surgeon: Adam Phenix, MD;  Location: [facility];  Service: Gynecology;  Laterality: N/A;  . Bilateral salpingectomy Bilateral 04/12/2014    Procedure: BILATERAL SALPINGECTOMY;  Surgeon: Adam Phenix, MD;  Location: [facility];  Service: Gynecology;  Laterality: Bilateral;   Family History  Problem Relation Age of Onset  .  Hypertension Mother   . Hypertension Father   . Hyperlipidemia Father   . Diabetes Maternal Grandmother   . Cancer Paternal Grandmother     started in lung  . Hypertension Paternal Grandmother    Social History  Substance Use Topics  . Smoking status: Current Every Day Smoker -- 0.25 packs/day for 5 years    Types: Cigarettes  . Smokeless tobacco: Never Used     Comment: stopped with preg  . Alcohol Use: No   OB History    Gravida Para Term Preterm AB TAB SAB Ectopic Multiple Living   0 1 1 0 0 0 2     Review of Systems  Constitutional: Negative for fever and chills.  Respiratory: Negative for shortness of breath.   Cardiovascular: Negative for chest pain.  Gastrointestinal: Negative for nausea, vomiting, diarrhea and constipation.  Genitourinary: Positive for dysuria, vaginal discharge and pelvic pain.  All other systems reviewed and are negative.     Allergies  Hydrocodone and Naprosyn  Home Medications   Prior to Admission medications   Medication Sig Start Date End Date Taking? Authorizing Provider  hydrochlorothiazide (HYDRODIURIL) 25 MG tablet Take 25 mg by mouth daily.   Yes Historical Provider, MD  ALPRAZolam Prudy Feeler) 0.5 MG tablet Take 1 tablet (0.5 mg total) by mouth 3 (three) times daily as needed for anxiety. 01/14/15   Trixie Dredge, PA-C  hydrOXYzine (ATARAX/VISTARIL) 10 MG tablet Take 1 tablet (10 mg total) by mouth every 6 (six) hours as needed for itching. Patient not taking: Reported  on 12/04/2014 07/25/14   Roxy Horseman, PA-C  ibuprofen (ADVIL,MOTRIN) 200 MG tablet Take 400 mg by mouth every 6 (six) hours as needed for mild pain or moderate pain.    Historical Provider, MD  ibuprofen (ADVIL,MOTRIN) 600 MG tablet Take 1 tablet (600 mg total) by mouth every 6 (six) hours as needed for mild pain. Patient not taking: Reported on 12/04/2014 10/31/14   Charm Rings, MD  oxyCODONE-acetaminophen (PERCOCET/ROXICET) 5-325 MG per tablet Take 1 tablet by mouth  every 6 (six) hours as needed for severe pain. Patient not taking: Reported on 12/04/2014 09/25/14   Catha Gosselin, PA-C  permethrin (ELIMITE) 5 % cream Apply to entire body other than face - let sit for 14 hours then wash off, may repeat in 1 week if still having symptoms Patient not taking: Reported on 12/04/2014 07/25/14   Roxy Horseman, PA-C  predniSONE (DELTASONE) 50 MG tablet Take 1 pill daily for 5 days. Patient not taking: Reported on 12/04/2014 10/31/14   Charm Rings, MD  sertraline (ZOLOFT) 50 MG tablet Take 1 tablet (50 mg total) by mouth daily. Patient not taking: Reported on 08/21/2015 01/14/15   Trixie Dredge, PA-C  traMADol (ULTRAM) 50 MG tablet Take 1 tablet (50 mg total) by mouth once. Patient not taking: Reported on 08/21/2015 03/27/15   Arthor Captain, PA-C   BP 136/85 mmHg  Pulse 71  Temp(Src) 98.3 F (36.8 C) (Oral)  Resp 16  SpO2 100%  LMP 03/09/2014 Physical Exam  Constitutional: She is oriented to person, place, and time. She appears well-developed and well-nourished.  HENT:  Head: Normocephalic and atraumatic.  Eyes: Conjunctivae and EOM are normal. Pupils are equal, round, and reactive to light.  Neck: Normal range of motion. Neck supple.  Cardiovascular: Normal rate and regular rhythm.  Exam reveals no gallop and no friction rub.   No murmur heard. Pulmonary/Chest: Effort normal and breath sounds normal. No respiratory distress. She has no wheezes. She has no rales. She exhibits no tenderness.  Abdominal: Soft. Bowel sounds are normal. She exhibits no distension and no mass. There is no tenderness. There is no rebound and no guarding.  No focal abdominal tenderness, no RLQ tenderness or pain at McBurney's point, no RUQ tenderness or Murphy's sign, no left-sided abdominal tenderness, no fluid wave, or signs of peritonitis   Genitourinary:  Pelvic exam chaperoned by female ER tech, no right or left adnexal tenderness, no uterine tenderness, moderate vaginal discharge,  no bleeding, no CMT or friability, no foreign body, no injury to the external genitalia, no other significant findings   Musculoskeletal: Normal range of motion. She exhibits no edema or tenderness.  Neurological: She is alert and oriented to person, place, and time.  Skin: Skin is warm and dry.  Psychiatric: She has a normal mood and affect. Her behavior is normal. Judgment and thought content normal.  Nursing note and vitals reviewed.   ED Course  Procedures (including critical care time) Results for orders placed or performed during the hospital encounter of 08/21/15  Wet prep, genital  Result Value Ref Range   Yeast Wet Prep HPF POC NONE SEEN NONE SEEN   Trich, Wet Prep NONE SEEN NONE SEEN   Clue Cells Wet Prep HPF POC PRESENT (A) NONE SEEN   WBC, Wet Prep HPF POC NONE SEEN NONE SEEN   Sperm NONE SEEN   Lipase, blood  Result Value Ref Range   Lipase 23 11 - 51 U/L  Comprehensive metabolic panel  Result  Value Ref Range   Sodium 137 135 - 145 mmol/L   Potassium 3.7 3.5 - 5.1 mmol/L   Chloride 105 101 - 111 mmol/L   CO2 26 22 - 32 mmol/L   Glucose, Bld 89 65 - 99 mg/dL   BUN 7 6 - 20 mg/dL   Creatinine, Ser 7.82 0.44 - 1.00 mg/dL   Calcium 8.8 (L) 8.9 - 10.3 mg/dL   Total Protein 7.1 6.5 - 8.1 g/dL   Albumin 4.3 3.5 - 5.0 g/dL   AST 15 15 - 41 U/L   ALT 9 (L) 14 - 54 U/L   Alkaline Phosphatase 42 38 - 126 U/L   Total Bilirubin 0.8 0.3 - 1.2 mg/dL   GFR calc non Af Amer >60 >60 mL/min   GFR calc Af Amer >60 >60 mL/min   Anion gap 6 5 - 15  CBC  Result Value Ref Range   WBC 3.3 (L) 4.0 - 10.5 K/uL   RBC 4.44 3.87 - 5.11 MIL/uL   Hemoglobin 12.5 12.0 - 15.0 g/dL   HCT 95.6 21.3 - 08.6 %   MCV 87.2 78.0 - 100.0 fL   MCH 28.2 26.0 - 34.0 pg   MCHC 32.3 30.0 - 36.0 g/dL   RDW 57.8 46.9 - 62.9 %   Platelets 225 150 - 400 K/uL  Urinalysis, Routine w reflex microscopic (not at [facility])  Result Value Ref Range   Color, Urine YELLOW YELLOW   APPearance CLEAR CLEAR    Specific Gravity, Urine 1.023 1.005 - 1.030   pH 6.0 5.0 - 8.0   Glucose, UA NEGATIVE NEGATIVE mg/dL   Hgb urine dipstick NEGATIVE NEGATIVE   Bilirubin Urine NEGATIVE NEGATIVE   Ketones, ur NEGATIVE NEGATIVE mg/dL   Protein, ur NEGATIVE NEGATIVE mg/dL   Nitrite NEGATIVE NEGATIVE   Leukocytes, UA NEGATIVE NEGATIVE    I have personally reviewed and evaluated these images and lab results as part of my medical decision-making.    MDM   Final diagnoses:  Concern about STD in female without diagnosis  BV (bacterial vaginosis)  PID (acute pelvic inflammatory disease)    Patient with lower abdominal pain. She states that she has been having symptoms for about a month. She does report some vaginal discharge. She reports possible STD exposure.  Pelvic exam is remarkable for some discharge and foul odor. She is mildly tender, but does not have unilateral adnexal tenderness. I'm suspicious of possible developing PID. Will treat patient accordingly.    Roxy Horseman, PA-C 08/21/15 1958  Donnetta Hutching, MD 08/22/15 1725

## 2015-08-21 NOTE — ED Notes (Signed)
Patient present with lower abdominal cramping x1 month, with nausea. C/o intermittent vaginal spotting, dysuria, malodorous urine and frequency. Denies fever/chills, denies diarrhea.

## 2015-08-21 NOTE — ED Notes (Signed)
Pt reports L and R pelvic  Pain x 2 weeks.  Has been taking motrin without relief.  Pt reports heat helps ease the pain.  LBm- last night.  Pt also reports nausea when pain gets severe.

## 2015-08-22 LAB — HIV ANTIBODY (ROUTINE TESTING W REFLEX): HIV Screen 4th Generation wRfx: NONREACTIVE

## 2015-08-22 LAB — GC/CHLAMYDIA PROBE AMP (~~LOC~~) NOT AT ARMC
Chlamydia: NEGATIVE
Neisseria Gonorrhea: NEGATIVE

## 2016-01-29 ENCOUNTER — Emergency Department (HOSPITAL_COMMUNITY)
Admission: EM | Admit: 2016-01-29 | Discharge: 2016-01-29 | Disposition: A | Discharge status: Home / Self Care | Payer: BLUE CROSS/BLUE SHIELD | Attending: Emergency Medicine | Admitting: Emergency Medicine

## 2016-01-29 ENCOUNTER — Encounter (HOSPITAL_COMMUNITY): Payer: Self-pay | Admitting: Emergency Medicine

## 2016-01-29 DIAGNOSIS — K029 Dental caries, unspecified: Secondary | ICD-10-CM | POA: Insufficient documentation

## 2016-01-29 DIAGNOSIS — F17210 Nicotine dependence, cigarettes, uncomplicated: Secondary | ICD-10-CM | POA: Insufficient documentation

## 2016-01-29 MED ORDER — AMOXICILLIN 500 MG PO CAPS: 500.0000 mg | ORAL_CAPSULE | Freq: Three times a day (TID) | ORAL | Status: DC

## 2016-01-29 MED ORDER — TRAMADOL HCL 50 MG PO TABS: 50.0000 mg | ORAL_TABLET | Freq: Four times a day (QID) | ORAL | Status: DC | PRN

## 2016-01-29 MED ORDER — TRAMADOL HCL 50 MG PO TABS
50.0000 mg | ORAL_TABLET | Freq: Once | ORAL | Status: AC
  Administered 2016-01-29: 50 mg via ORAL
  Filled 2016-01-29: qty 1

## 2016-01-29 MED ORDER — AMOXICILLIN 500 MG PO CAPS
500.0000 mg | ORAL_CAPSULE | Freq: Once | ORAL | Status: AC
  Administered 2016-01-29: 500 mg via ORAL
  Filled 2016-01-29: qty 1

## 2016-01-29 NOTE — ED Notes (Signed)
Pt. reports left upper molar pain for 1 1/2 weeks unrelieved by OTC pain medication .

## 2016-01-29 NOTE — Discharge Instructions (Signed)
Continue to take ibuprofen with the medication we give you. Follow up with the dentist. Do not drive while taking the narcotic as it will make you sleepy.   Dental Caries Dental caries (also called tooth decay) is the most common oral disease. It can occur at any age but is more common in children and young adults.  HOW DENTAL CARIES DEVELOPS  The process of decay begins when bacteria and foods (particularly sugars and starches) combine in your mouth to produce plaque. Plaque is a substance that sticks to the hard, outer surface of a tooth (enamel). The bacteria in plaque produce acids that attack enamel. These acids may also attack the root surface of a tooth (cementum) if it is exposed. Repeated attacks dissolve these surfaces and create holes in the tooth (cavities). If left untreated, the acids destroy the other layers of the tooth.  RISK FACTORS  Frequent sipping of sugary beverages.   Frequent snacking on sugary and starchy foods, especially those that easily get stuck in the teeth.   Poor oral hygiene.   Dry mouth.   Substance abuse such as methamphetamine abuse.   Broken or poor-fitting dental restorations.   Eating disorders.   Gastroesophageal reflux disease (GERD).   Certain radiation treatments to the head and neck. SYMPTOMS In the early stages of dental caries, symptoms are seldom present. Sometimes white, chalky areas may be seen on the enamel or other tooth layers. In later stages, symptoms may include:  Pits and holes on the enamel.  Toothache after sweet, hot, or cold foods or drinks are consumed.  Pain around the tooth.  Swelling around the tooth. DIAGNOSIS  Most of the time, dental caries is detected during a regular dental checkup. A diagnosis is made after a thorough medical and dental history is taken and the surfaces of your teeth are checked for signs of dental caries. Sometimes special instruments, such as lasers, are used to check for dental  caries. Dental X-ray exams may be taken so that areas not visible to the eye (such as between the contact areas of the teeth) can be checked for cavities.  TREATMENT  If dental caries is in its early stages, it may be reversed with a fluoride treatment or an application of a remineralizing agent at the dental office. Thorough brushing and flossing at home is needed to aid these treatments. If it is in its later stages, treatment depends on the location and extent of tooth destruction:   If a small area of the tooth has been destroyed, the destroyed area will be removed and cavities will be filled with a material such as gold, silver amalgam, or composite resin.   If a large area of the tooth has been destroyed, the destroyed area will be removed and a cap (crown) will be fitted over the remaining tooth structure.   If the center part of the tooth (pulp) is affected, a procedure called a root canal will be needed before a filling or crown can be placed.   If most of the tooth has been destroyed, the tooth may need to be pulled (extracted). HOME CARE INSTRUCTIONS You can prevent, stop, or reverse dental caries at home by practicing good oral hygiene. Good oral hygiene includes:  Thoroughly cleaning your teeth at least twice a day with a toothbrush and dental floss.   Using a fluoride toothpaste. A fluoride mouth rinse may also be used if recommended by your dentist or health care provider.   Restricting the amount  of sugary and starchy foods and sugary liquids you consume.   Avoiding frequent snacking on these foods and sipping of these liquids.   Keeping regular visits with a dentist for checkups and cleanings. PREVENTION   Practice good oral hygiene.  Consider a dental sealant. A dental sealant is a coating material that is applied by your dentist to the pits and grooves of teeth. The sealant prevents food from being trapped in them. It may protect the teeth for several  years.  Ask about fluoride supplements if you live in a community without fluorinated water or with water that has a low fluoride content. Use fluoride supplements as directed by your dentist or health care provider.  Allow fluoride varnish applications to teeth if directed by your dentist or health care provider.   This information is not intended to replace advice given to you by your health care provider. Make sure you discuss any questions you have with your health care provider.   Document Released: 04/04/2002 Document Revised: 08/03/2014 Document Reviewed: 07/15/2012 Elsevier Interactive Patient Education [employer]2016 Elsevier Inc.

## 2016-01-29 NOTE — ED Provider Notes (Signed)
CSN: [id number]     Arrival date & time 01/29/16  1943 History  By signing my name below, I, Phillis Haggis, attest that this documentation has been prepared under the direction and in the presence of Kerrie Buffalo, NP-C Electronically Signed: Phillis Haggis, ED Scribe. 01/29/2016. 8:12 PM.   Chief Complaint  Patient presents with  . Dental Pain   Patient is a 30 y.o. female presenting with tooth pain. The history is provided by the patient. No language interpreter was used.  Dental Pain Location:  Upper Upper teeth location:  15/LU 2nd molar Quality:  Aching Severity:  Mild Onset quality:  Gradual Duration:  2 weeks Timing:  Constant Progression:  Worsening Chronicity:  New Context: dental fracture   Ineffective treatments:  Acetaminophen and NSAIDs Associated symptoms: fever and headaches   Associated symptoms: no congestion and no neck pain   HPI Comments: Brenda Blankenship is a 30 y.o. female who presents to the Emergency Department complaining of left upper molar dental pain onset 2 weeks ago. Pt reports that she was eating a hard candy when her left upper second molar broke. She reports associated intermittent fever and pain that radiates to the left temple and left ear. She has been using Tylenol and ibuprofen for the pain to no relief. She denies chills, cough, congestion, sore throat, rhinorrhea, nausea, or vomiting. Pt is allergic to hydrocodone and naprosyn.  Past Medical History  Diagnosis Date  . Environmental allergies   . Preterm labor   . Anemia   . Abnormal cervical Papanicolaou smear     cryo  . Pregnancy induced hypertension   . DUB (dysfunctional uterine bleeding)    Past Surgical History  Procedure Laterality Date  . Cryotherapy    . Tubal ligation    . Laparoscopic tubal ligation Bilateral 11/10/2013    Procedure: bilateral LAPAROSCOPIC TUBAL LIGATION with filshie clips;  Surgeon: Freddrick March. Tenny Craw, MD;  Location: [facility];  Service: Gynecology;  Laterality:  Bilateral;  . Vaginal hysterectomy N/A 04/12/2014    Procedure: HYSTERECTOMY VAGINAL;  Surgeon: Adam Phenix, MD;  Location: [facility];  Service: Gynecology;  Laterality: N/A;  . Bilateral salpingectomy Bilateral 04/12/2014    Procedure: BILATERAL SALPINGECTOMY;  Surgeon: Adam Phenix, MD;  Location: [facility];  Service: Gynecology;  Laterality: Bilateral;   Family History  Problem Relation Age of Onset  . Hypertension Mother   . Hypertension Father   . Hyperlipidemia Father   . Diabetes Maternal Grandmother   . Cancer Paternal Grandmother     started in lung  . Hypertension Paternal Grandmother    Social History  Substance Use Topics  . Smoking status: Current Every Day Smoker -- 0.25 packs/day for 0 years    Types: Cigarettes  . Smokeless tobacco: Never Used     Comment: stopped with preg  . Alcohol Use: No   OB History    Gravida Para Term Preterm AB TAB SAB Ectopic Multiple Living   0 1 1 0 0 0 2     Review of Systems  Constitutional: Positive for fever. Negative for chills.  HENT: Positive for dental problem and ear pain. Negative for congestion and sore throat.   Respiratory: Negative for cough.   Gastrointestinal: Negative for nausea and vomiting.  Musculoskeletal: Negative for neck pain.  Skin: Negative for rash.  Neurological: Positive for headaches.  Hematological: Positive for adenopathy.   Allergies  Hydrocodone and Naprosyn  Home Medications   Prior to Admission  medications   Medication Sig Start Date End Date Taking? Authorizing Provider  ALPRAZolam Prudy Feeler(XANAX) 0.5 MG tablet Take 1 tablet (0.5 mg total) by mouth 3 (three) times daily as needed for anxiety. 01/14/15   Trixie DredgeEmily West, PA-C  amoxicillin (AMOXIL) 500 MG capsule Take 1 capsule (500 mg total) by mouth 3 (three) times daily. 01/29/16   Hope Orlene OchM Neese, NP  doxycycline (VIBRAMYCIN) 100 MG capsule Take 1 capsule (100 mg total) by mouth 2 (two) times daily. 08/21/15   Roxy Horsemanobert Browning, PA-C  hydrochlorothiazide  (HYDRODIURIL) 25 MG tablet Take 25 mg by mouth daily.    Historical Provider, MD  hydrOXYzine (ATARAX/VISTARIL) 10 MG tablet Take 1 tablet (10 mg total) by mouth every 6 (six) hours as needed for itching. Patient not taking: Reported on 12/04/2014 07/25/14   Roxy Horsemanobert Browning, PA-C  ibuprofen (ADVIL,MOTRIN) 200 MG tablet Take 400 mg by mouth every 6 (six) hours as needed for mild pain or moderate pain.    Historical Provider, MD  ibuprofen (ADVIL,MOTRIN) 600 MG tablet Take 1 tablet (600 mg total) by mouth every 6 (six) hours as needed for mild pain. Patient not taking: Reported on 12/04/2014 10/31/14   Charm RingsErin J Honig, MD  metroNIDAZOLE (FLAGYL) 500 MG tablet Take 1 tablet (500 mg total) by mouth 2 (two) times daily. 08/21/15   Roxy Horsemanobert Browning, PA-C  oxyCODONE-acetaminophen (PERCOCET/ROXICET) 5-325 MG per tablet Take 1 tablet by mouth every 6 (six) hours as needed for severe pain. Patient not taking: Reported on 12/04/2014 09/25/14   Catha GosselinHanna Patel-Mills, PA-C  permethrin (ELIMITE) 5 % cream Apply to entire body other than face - let sit for 14 hours then wash off, may repeat in 1 week if still having symptoms Patient not taking: Reported on 12/04/2014 07/25/14   Roxy Horsemanobert Browning, PA-C  predniSONE (DELTASONE) 50 MG tablet Take 1 pill daily for 5 days. Patient not taking: Reported on 12/04/2014 10/31/14   Charm RingsErin J Honig, MD  sertraline (ZOLOFT) 50 MG tablet Take 1 tablet (50 mg total) by mouth daily. Patient not taking: Reported on 08/21/2015 01/14/15   Trixie DredgeEmily West, PA-C  traMADol (ULTRAM) 50 MG tablet Take 1 tablet (50 mg total) by mouth every 6 (six) hours as needed. 01/29/16   Hope Orlene OchM Neese, NP   BP 136/78 mmHg  Pulse 73  Temp(Src) 99 F (37.2 C) (Oral)  Resp 16  Ht 5\' 3"  (1.6 m)  Wt 58.968 kg  BMI 23.03 kg/m2  SpO2 99%  LMP 03/09/2014 Physical Exam  Constitutional: She is oriented to person, place, and time. She appears well-developed and well-nourished. No distress.  HENT:  Head: Normocephalic and atraumatic.   Mouth/Throat: Uvula is midline, oropharynx is clear and moist and mucous membranes are normal. Dental caries present. No oropharyngeal exudate, posterior oropharyngeal edema or posterior oropharyngeal erythema.  Broken second left upper molar with decay to the gum line; left cervical lymphadenopathy  Eyes: Conjunctivae and EOM are normal. Pupils are equal, round, and reactive to light.  Neck: Normal range of motion. Neck supple.  Cardiovascular: Normal rate and regular rhythm.   Pulmonary/Chest: Effort normal.  Abdominal: Soft. There is no tenderness.  Musculoskeletal: Normal range of motion.  Lymphadenopathy:    She has cervical adenopathy.  Neurological: She is alert and oriented to person, place, and time.  Skin: Skin is warm and dry.  Psychiatric: She has a normal mood and affect. Her behavior is normal.  Nursing note and vitals reviewed.   ED Course  Procedures (including critical care time)  DIAGNOSTIC STUDIES: Oxygen Saturation is 99% on RA, normal by my interpretation.    COORDINATION OF CARE: 8:08 PM-Discussed treatment plan which includes anti-biotics and follow up with dentist with pt at bedside and pt agreed to plan.  = Labs Review Labs Reviewed - No data to display  Imaging Review No results found.  MDM   Patient with toothache.  No gross abscess.  Exam unconcerning for Ludwig's angina or spread of infection.  Will treat with amoxicillin and pain medicine.  Urged patient to follow-up with dentist.  Name and number of on call dentist given.   Final diagnoses:  Pain due to dental caries   I personally performed the services described in this documentation, which was scribed in my presence. The recorded information has been reviewed and is accurate.    Form[place]Hope M Neese, [place]NP 01/29/16 2157  Lyndal Pulleyaniel Knott, MD 01/30/16 [phone]

## 2016-04-01 ENCOUNTER — Ambulatory Visit (INDEPENDENT_AMBULATORY_CARE_PROVIDER_SITE_OTHER): Payer: BLUE CROSS/BLUE SHIELD | Admitting: Certified Nurse Midwife

## 2016-04-01 ENCOUNTER — Encounter: Payer: Self-pay | Admitting: Certified Nurse Midwife

## 2016-04-01 ENCOUNTER — Ambulatory Visit (INDEPENDENT_AMBULATORY_CARE_PROVIDER_SITE_OTHER): Payer: Self-pay | Admitting: Clinical

## 2016-04-01 VITALS — BP 124/69 | HR 86 | Wt 126.5 lb

## 2016-04-01 DIAGNOSIS — F4323 Adjustment disorder with mixed anxiety and depressed mood: Secondary | ICD-10-CM

## 2016-04-01 DIAGNOSIS — R102 Pelvic and perineal pain: Secondary | ICD-10-CM

## 2016-04-01 DIAGNOSIS — Z113 Encounter for screening for infections with a predominantly sexual mode of transmission: Secondary | ICD-10-CM

## 2016-04-01 LAB — POCT URINALYSIS DIP (DEVICE)
Bilirubin Urine: NEGATIVE
Glucose, UA: NEGATIVE mg/dL
Hgb urine dipstick: NEGATIVE
Ketones, ur: NEGATIVE mg/dL
Leukocytes, UA: NEGATIVE
Nitrite: NEGATIVE
Protein, ur: NEGATIVE mg/dL
Specific Gravity, Urine: 1.015 (ref 1.005–1.030)
Urobilinogen, UA: 0.2 mg/dL (ref 0.0–1.0)
pH: 6 (ref 5.0–8.0)

## 2016-04-01 NOTE — Progress Notes (Signed)
  ASSESSMENT: Pt currently experiencing Adjustment disorder with mixed anxious and depressed mood. Pt needs to establish care with PCP. Pt would benefit from psychoeducation and brief therapeutic intervention regarding coping with symptoms of anxiety and depression.Pt may benefit from additional community resources.  Stage of Change: determination  PLAN: 1. F/U with behavioral health clinician  2. Psychiatric Medications: none current 3. Behavioral recommendations:   -Remember to keep Medicaid denial letter(will need for "orange card" application) -Go to either DSS or [facility] to pick up orange card application, and inquire about establishing care with PCP -Continue to use [facility], as well as other community resources discussed in office visit -Read educational material regarding coping with symptoms of anxiety and depression  SUBJECTIVE: Pt. referred by Donette LarryMelanie Bhambri, CNM, for symptoms of anxiety and depression Pt. reports the following symptoms/concerns: Pt states that she is feeling an increase in stress because of marital problems, including financial stressors and lack of insurance. Duration of problem: Increase in past month Severity:mild   OBJECTIVE: Orientation & Cognition: Oriented x3. Thought processes normal and appropriate to situation. Mood: appropriate Affect: appropriate Appearance: appropriate Risk of harm to self or others: no known risk of harm to self or others Substance use: self Assessments administered: PHQ9: 14/ GAD7: 13  Diagnosis: Adjustment disorder with mixed anxious and depressed mood CPT Code: F43.23  -------------------------------------------- Other(s) present in the room: none  Time spent with patient in exam room: 30 minutes, 3:10-3:40pm  Depression screen PHQ 2/9 04/01/2016  Decreased Interest 1  Down, Depressed, Hopeless 1  PHQ - 2 Score 2  Altered sleeping 3  Tired, decreased energy 3  Change in appetite 3   Feeling bad or failure about yourself  1  Trouble concentrating 1  Moving slowly or fidgety/restless 0  Suicidal thoughts 0  PHQ-9 Score 13   GAD 7 : Generalized Anxiety Score 04/01/2016  Nervous, Anxious, on Edge 3  Control/stop worrying 1  Worry too much - different things 2  Trouble relaxing 3  Restless 2  Easily annoyed or irritable 2  Afraid - awful might happen 1  Total GAD 7 Score 14

## 2016-04-01 NOTE — Progress Notes (Signed)
Subjective:     Brenda Blankenship is a 30 y.o. female here for a problem visit. Current complaints: low abdominal cramping for "months".  She has used Ibuprofen with little relief. She denies constipation, diarrhea, and fever. She has been seen in ED and treated for PID earlier this year but reports no improvement. She also c/o thin white vaginal discharge with odor for the last month. She denies itching or irritation. She found out her partner was not monogamous about 1 month ago and would like full STD screen. She is s/p TVH 2 yrs ago for DUB and pain that failed medical treatment.  Gynecologic History Patient's last menstrual period was 03/09/2014. TVH- 2015  Obstetric History OB History  Gravida Para Term Preterm AB Living  3 2 2  0 1 2  SAB TAB Ectopic Multiple Live Births  0 1 0 0 2    # Outcome Date GA Lbr Len/2nd Weight Sex Delivery Anes PTL Lv  3 Term 08/21/13 [id number] 03:08 / 00:02 5 lb 15.6 oz (2.71 kg) F Vag-Spont None  LIV  2 TAB 2012          1 Term 07/09/07    M Vag-Spont EPI  LIV    ROS  The following portions of the patient's history were reviewed and updated as appropriate: allergies, current medications, past family history, past medical history, past social history, past surgical history and problem list.  Review of Systems Constitutional: negative for chills and fevers Gastrointestinal: negative for abdominal pain, constipation, diarrhea, nausea and vomiting Genitourinary:positive for vaginal discharge, negative for genital lesions and sexual problems    Objective:    BP 124/69 (BP Location: Left Arm, Patient Position: Sitting, Cuff Size: Normal)   Pulse 86   Wt 57.4 kg (126 lb 8 oz)   LMP 03/09/2014   BMI 22.41 kg/m   General Appearance:    Alert, cooperative, no distress, appears stated age  Head:    Normocephalic, without obvious abnormality, atraumatic  Eyes:    Ears:    Nose:   Throat:   Neck:   Back:       Lungs:     respirations unlabored  Chest  Wall:       Heart:    Regular rate and rhythm  Breast Exam:      Abdomen:     Soft, non-tender, bowel sounds active all four quadrants,    no masses, no organomegaly  Genitalia:    Normal female without lesion, rugated, cuff intact, scant white discharge, +tenderness over bladder, no adnexal masses or tenderness  Rectal:    Extremities:   Extremities normal, atraumatic, no cyanosis or edema  Pulses:     Skin:   Skin color, texture, turgor normal, no rashes or lesions  Lymph nodes:     Neurologic:   CNII-XII intact         Assessment:  Pelvic pain in female STD screening    Plan:  Urinalysis GC/CMT cx HIV, Hep B, RPR Wet prep Likely not GYN etiology, discussed with pt pain may be GI or Uro, could offer TVUS although it would unlikely detect etiology and pt is self pay, will defer for now Follow up with PCP if pain persists

## 2016-04-02 LAB — RPR TITER: RPR Titer: 1:1 {titer}

## 2016-04-02 LAB — GC/CHLAMYDIA PROBE AMP (~~LOC~~) NOT AT ARMC
Chlamydia: NEGATIVE
Neisseria Gonorrhea: NEGATIVE

## 2016-04-02 LAB — HEPATITIS B SURFACE ANTIGEN: Hepatitis B Surface Ag: NEGATIVE

## 2016-04-02 LAB — WET PREP, GENITAL
Clue Cells Wet Prep HPF POC: NONE SEEN
Trich, Wet Prep: NONE SEEN
WBC, Wet Prep HPF POC: NONE SEEN
Yeast Wet Prep HPF POC: NONE SEEN

## 2016-04-02 LAB — HIV ANTIBODY (ROUTINE TESTING W REFLEX): HIV 1&2 Ab, 4th Generation: NONREACTIVE

## 2016-04-02 LAB — FLUORESCENT TREPONEMAL AB(FTA)-IGG-BLD: Fluorescent Treponemal ABS: NONREACTIVE

## 2016-04-02 LAB — RPR: RPR Ser Ql: REACTIVE — AB

## 2016-04-14 ENCOUNTER — Telehealth: Payer: Self-pay | Admitting: *Deleted

## 2016-04-14 NOTE — Telephone Encounter (Signed)
Pt left message yesterday @ [phone] stating that she has reviewed her results and has some questions. Please call back.

## 2016-04-16 NOTE — Telephone Encounter (Signed)
Patient results have been reviewed and patient notified.

## 2016-05-22 ENCOUNTER — Encounter (HOSPITAL_COMMUNITY): Payer: Self-pay | Admitting: Emergency Medicine

## 2016-05-22 ENCOUNTER — Emergency Department (HOSPITAL_COMMUNITY)
Admission: EM | Admit: 2016-05-22 | Discharge: 2016-05-22 | Disposition: A | Discharge status: Home / Self Care | Payer: Self-pay | Attending: Emergency Medicine | Admitting: Emergency Medicine

## 2016-05-22 DIAGNOSIS — N760 Acute vaginitis: Secondary | ICD-10-CM | POA: Insufficient documentation

## 2016-05-22 DIAGNOSIS — B9689 Other specified bacterial agents as the cause of diseases classified elsewhere: Secondary | ICD-10-CM

## 2016-05-22 DIAGNOSIS — F17210 Nicotine dependence, cigarettes, uncomplicated: Secondary | ICD-10-CM | POA: Insufficient documentation

## 2016-05-22 LAB — URINALYSIS, ROUTINE W REFLEX MICROSCOPIC
Bilirubin Urine: NEGATIVE
Glucose, UA: NEGATIVE mg/dL
Hgb urine dipstick: NEGATIVE
Ketones, ur: NEGATIVE mg/dL
Leukocytes, UA: NEGATIVE
Nitrite: NEGATIVE
Protein, ur: NEGATIVE mg/dL
Specific Gravity, Urine: 1.023 (ref 1.005–1.030)
pH: 6 (ref 5.0–8.0)

## 2016-05-22 LAB — LIPASE, BLOOD: Lipase: 21 U/L (ref 11–51)

## 2016-05-22 LAB — CBC
HCT: 39.6 % (ref 36.0–46.0)
Hemoglobin: 12.9 g/dL (ref 12.0–15.0)
MCH: 28 pg (ref 26.0–34.0)
MCHC: 32.6 g/dL (ref 30.0–36.0)
MCV: 86.1 fL (ref 78.0–100.0)
Platelets: 197 10*3/uL (ref 150–400)
RBC: 4.6 MIL/uL (ref 3.87–5.11)
RDW: 13.1 % (ref 11.5–15.5)
WBC: 3.2 10*3/uL — ABNORMAL LOW (ref 4.0–10.5)

## 2016-05-22 LAB — COMPREHENSIVE METABOLIC PANEL
ALT: 14 U/L (ref 14–54)
AST: 22 U/L (ref 15–41)
Albumin: 3.7 g/dL (ref 3.5–5.0)
Alkaline Phosphatase: 36 U/L — ABNORMAL LOW (ref 38–126)
Anion gap: 6 (ref 5–15)
BUN: 8 mg/dL (ref 6–20)
CO2: 24 mmol/L (ref 22–32)
Calcium: 8.7 mg/dL — ABNORMAL LOW (ref 8.9–10.3)
Chloride: 108 mmol/L (ref 101–111)
Creatinine, Ser: 0.97 mg/dL (ref 0.44–1.00)
GFR calc Af Amer: >60 mL/min (ref >60)
GFR calc non Af Amer: >60 mL/min (ref >60)
Glucose, Bld: 112 mg/dL — ABNORMAL HIGH (ref 65–99)
Potassium: 3.8 mmol/L (ref 3.5–5.1)
Sodium: 138 mmol/L (ref 135–145)
Total Bilirubin: 1 mg/dL (ref 0.3–1.2)
Total Protein: 6.3 g/dL — ABNORMAL LOW (ref 6.5–8.1)

## 2016-05-22 LAB — WET PREP, GENITAL
Sperm: NONE SEEN
Trich, Wet Prep: NONE SEEN
Yeast Wet Prep HPF POC: NONE SEEN

## 2016-05-22 MED ORDER — METRONIDAZOLE 500 MG PO TABS: 500.0000 mg | ORAL_TABLET | Freq: Two times a day (BID) | ORAL | 0 refills | Status: DC

## 2016-05-22 MED ORDER — IBUPROFEN 400 MG PO TABS
600.0000 mg | ORAL_TABLET | Freq: Once | ORAL | Status: AC
  Administered 2016-05-22: 600 mg via ORAL
  Filled 2016-05-22: qty 1

## 2016-05-22 MED ORDER — KETOROLAC TROMETHAMINE 30 MG/ML IJ SOLN: 30.0000 mg | Freq: Once | INTRAMUSCULAR | Status: DC

## 2016-05-22 MED ORDER — KETOROLAC TROMETHAMINE 60 MG/2ML IM SOLN
60.0000 mg | Freq: Once | INTRAMUSCULAR | Status: DC
  Filled 2016-05-22: qty 2

## 2016-05-22 MED ORDER — IBUPROFEN 600 MG PO TABS: 600.0000 mg | ORAL_TABLET | Freq: Four times a day (QID) | ORAL | 0 refills | Status: DC | PRN

## 2016-05-22 NOTE — ED Notes (Addendum)
Delay in lab draw,  Pelvic exam at this time.

## 2016-05-22 NOTE — Discharge Instructions (Signed)
You will be called if any of the rest of your tests come back abnormal. Take your antibiotic twice a day for 7 days. Avoid drinking alcohol while taking this antibiotic. You may take the prescription ibuprofen up to 4 times a day for your pain. You can also try taking Extra Strength Tylenol. Do not take over the counter ibuprofen while you are taking the prescription ibuprofen.

## 2016-05-22 NOTE — ED Triage Notes (Signed)
Pt from home reports suprapubic cramping and clear discharge x 2.5 weeks with spotting starting today.  Pt states she was seen by per PCP approx a month ago for STI check but has since had sexual intercourse with another.  Pt has had a partial hysterectomy.  Pt denies any urinary symptoms.  NAD, A&O.

## 2016-05-22 NOTE — ED Provider Notes (Signed)
[facility]PT Provider Note   CSN: [id number] Arrival date & time: 05/22/16  1105     History   Chief Complaint Chief Complaint  Patient presents with  . Abdominal Pain    HPI Brenda Blankenship is a 30 year old woman with HTN, history of abnormal uterine bleeding, abnormal PAP smear, hysterectomy.  HPI  She presents with suprapubic pain and clear discharge without odor. Pain is cramping and pressure. Constant. Has been going for 2.5 weeks. She was taking OTC ibuprofen that was helping but is not working anymore. Resting and sitting/lying improves her pain. Standing and heavy lifting makes it worse. Some spotting. She has an intermittent headache. No fevers. She has been having night sweats.   Rarely gets headache. Similar to headaches she's had before. Photophobia. No toxic exposures. No illicits.  Her husband had non-gonococcal urethritis contracted from someone else. Last had sexual intercourse with him 2 weeks ago. Had STD testing 9/6.   Past Medical History:  Diagnosis Date  . Abnormal cervical Papanicolaou smear    cryo  . Anemia   . DUB (dysfunctional uterine bleeding)   . Environmental allergies   . Pregnancy induced hypertension   . Preterm labor     Patient Active Problem List   Diagnosis Date Noted  . Postoperative state 04/12/2014  . DUB (dysfunctional uterine bleeding) 03/08/2014  . Dysmenorrhea 03/08/2014    Past Surgical History:  Procedure Laterality Date  . BILATERAL SALPINGECTOMY Bilateral 04/12/2014   Procedure: BILATERAL SALPINGECTOMY;  Surgeon: Adam Phenix, MD;  Location: [facility];  Service: Gynecology;  Laterality: Bilateral;  . CRYOTHERAPY    . LAPAROSCOPIC TUBAL LIGATION Bilateral 11/10/2013   Procedure: bilateral LAPAROSCOPIC TUBAL LIGATION with filshie clips;  Surgeon: Freddrick March. Tenny Craw, MD;  Location: [facility];  Service: Gynecology;  Laterality: Bilateral;  . TUBAL LIGATION    . VAGINAL HYSTERECTOMY N/A 04/12/2014   Procedure: HYSTERECTOMY  VAGINAL;  Surgeon: Adam Phenix, MD;  Location: [facility];  Service: Gynecology;  Laterality: N/A;    OB History    Gravida Para Term Preterm AB Living   3 2 2  0 1 2   SAB TAB Ectopic Multiple Live Births   0 1 0 0 2       Home Medications    Prior to Admission medications   Medication Sig Start Date End Date Taking? Authorizing Provider  ALPRAZolam Prudy Feeler) 0.5 MG tablet Take 1 tablet (0.5 mg total) by mouth 3 (three) times daily as needed for anxiety. 01/14/15   Trixie Dredge, PA-C  hydrochlorothiazide (HYDRODIURIL) 25 MG tablet Take 25 mg by mouth daily.    Historical Provider, MD  ibuprofen (ADVIL,MOTRIN) 600 MG tablet Take 1 tablet (600 mg total) by mouth every 6 (six) hours as needed. 05/22/16   Lora Paula, MD  metroNIDAZOLE (FLAGYL) 500 MG tablet Take 1 tablet (500 mg total) by mouth 2 (two) times daily. 05/22/16   Lora Paula, MD    Family History Family History  Problem Relation Age of Onset  . Diabetes Maternal Grandmother   . Cancer Paternal Grandmother     started in lung  . Hypertension Paternal Grandmother   . Hypertension Mother   . Hypertension Father   . Hyperlipidemia Father     Social History Social History  Substance Use Topics  . Smoking status: Current Every Day Smoker    Packs/day: 0.25    Years: 0.00    Types: Cigarettes  . Smokeless tobacco: Never Used  Comment: stopped with preg  . Alcohol use No     Allergies   Hydrocodone and Naprosyn [naproxen]   Review of Systems Review of Systems Constitutional: no fevers/chills Eyes: no vision changes Ears, nose, mouth, throat, and face: no cough Respiratory: no shortness of breath Cardiovascular: no chest pain Gastrointestinal: no nausea/vomiting, +constipation-last BM this morning, no diarrhea Genitourinary: discomfort with urination, +hematuria Integument: no rash Hematologic/lymphatic: no bleeding/bruising, no edema Musculoskeletal: no arthralgias, no myalgias Neurological: no  paresthesias, no weakness   Physical Exam Updated Vital Signs BP 134/77   Pulse (!) 56   Temp 98.2 F (36.8 C) (Oral)   Ht 5\' 3"  (1.6 m)   Wt 59 kg   LMP 03/09/2014   SpO2 100%   BMI 23.03 kg/m   Physical Exam General Apperance: NAD Head: Normocephalic, atraumatic Eyes: PERRL, EOMI, anicteric sclera Ears: Normal external ear canal Nose: Nares normal, septum midline, mucosa normal Throat: Lips, mucosa and tongue normal  Neck: Supple, trachea midline Back: No tenderness or bony abnormality  Lungs: Clear to auscultation bilaterally. No wheezes, rhonchi or rales. Breathing comfortably Chest Wall: Nontender, no deformity Heart: Regular rate and rhythm, no murmur/rub/gallop Abdomen: Soft, tender to palpation in suprapubic region, nondistended, no rebound/guarding GU: No cervix identified, scant grey-white discharge in vaginal vault. No lesions otherwise. Extremities: Normal, atraumatic, warm and well perfused, no edema Pulses: 2+ throughout Skin: No rashes or lesions Neurologic: Alert and oriented x 3. CNII-XII intact. Normal strength and sensation  ED Treatments / Results  Labs (all labs ordered are listed, but only abnormal results are displayed) Labs Reviewed  WET PREP, GENITAL - Abnormal; Notable for the following:       Result Value   Clue Cells Wet Prep HPF POC PRESENT (*)    WBC, Wet Prep HPF POC RARE (*)    All other components within normal limits  COMPREHENSIVE METABOLIC PANEL - Abnormal; Notable for the following:    Glucose, Bld 112 (*)    Calcium 8.7 (*)    Total Protein 6.3 (*)    Alkaline Phosphatase 36 (*)    All other components within normal limits  CBC - Abnormal; Notable for the following:    WBC 3.2 (*)    All other components within normal limits  LIPASE, BLOOD  URINALYSIS, ROUTINE W REFLEX MICROSCOPIC (NOT AT [facility])  HIV ANTIBODY (ROUTINE TESTING)  RPR  GC/CHLAMYDIA PROBE AMP ([place]) NOT AT [facility]  WET PREP  (BD AFFIRM) ([place])    GC/CHLAMYDIA PROBE AMP ([place]) NOT AT [facility]  GC/CHLAMYDIA PROBE AMP ([place]) NOT AT [facility]   Procedures   Medications Ordered in ED Medications  ibuprofen (ADVIL,MOTRIN) tablet 600 mg (600 mg Oral Given 05/22/16 1326)     Initial Impression / Assessment and Plan / ED Course  I have reviewed the triage vital signs and the nursing notes.  Pertinent labs & imaging results that were available during my care of the patient were reviewed by me and considered in my medical decision making (see chart for details).  Clinical Course   30 year old woman with HTN, history of abnormal uterine bleeding, abnormal PAP smear, hysterectomy presents with suprapubic pain and clear discharge. She had some grey white scant discharge on pelvic exam. WBC at baseline of around 3.2. Otherwise CBC unremarkable. CMP unremarkable. Wet Prep with clue cells present. UA negative for infection or hgb.   Final Clinical Impressions(s) / ED Diagnoses   Final diagnoses:  Bacterial vaginosis  HIV, RPR, GC/Chlamydia  pending. Unlikely to have PID since she has had her uterus and fallopian tubes removed. Will discharge her on Flagyl 500mg  BID for 7 days. Ibuprofen 600mg  q6hr prn pain. Follow up with PCP.   New Prescriptions New Prescriptions   IBUPROFEN (ADVIL,MOTRIN) 600 MG TABLET    Take 1 tablet (600 mg total) by mouth every 6 (six) hours as needed.   METRONIDAZOLE (FLAGYL) 500 MG TABLET    Take 1 tablet (500 mg total) by mouth 2 (two) times daily.     Lora PaulaJennifer T Krall, MD 05/22/16 1403    Marily MemosJason Mesner, MD 05/22/16 [phone]

## 2016-05-24 ENCOUNTER — Telehealth (HOSPITAL_BASED_OUTPATIENT_CLINIC_OR_DEPARTMENT_OTHER): Payer: Self-pay | Admitting: Emergency Medicine

## 2016-05-24 NOTE — Telephone Encounter (Signed)
Will call Reactive RPR with negative T Pallidum to state health dept on 05/25/16 and followup as per their recommendations

## 2016-05-25 LAB — GC/CHLAMYDIA PROBE AMP (~~LOC~~) NOT AT ARMC
Chlamydia: NEGATIVE
Neisseria Gonorrhea: NEGATIVE

## 2016-05-25 LAB — RPR, QUANT+TP ABS (REFLEX)
Rapid Plasma Reagin, Quant: 1:1 {titer} — ABNORMAL HIGH
T Pallidum Abs: NEGATIVE

## 2016-05-25 LAB — HIV ANTIBODY (ROUTINE TESTING W REFLEX): HIV Screen 4th Generation wRfx: NONREACTIVE

## 2016-05-25 LAB — RPR: RPR Ser Ql: REACTIVE — AB

## 2016-05-25 NOTE — Telephone Encounter (Signed)
Per Standley Brookingorey, State health dept, is false positive, T palladium is negative

## 2016-10-14 ENCOUNTER — Emergency Department (HOSPITAL_COMMUNITY): Payer: Self-pay

## 2016-10-14 ENCOUNTER — Encounter (HOSPITAL_COMMUNITY): Payer: Self-pay | Admitting: Emergency Medicine

## 2016-10-14 ENCOUNTER — Emergency Department (HOSPITAL_COMMUNITY)
Admission: EM | Admit: 2016-10-14 | Discharge: 2016-10-14 | Disposition: A | Discharge status: Home / Self Care | Payer: Self-pay | Attending: Emergency Medicine | Admitting: Emergency Medicine

## 2016-10-14 DIAGNOSIS — R1114 Bilious vomiting: Secondary | ICD-10-CM | POA: Insufficient documentation

## 2016-10-14 DIAGNOSIS — R1084 Generalized abdominal pain: Secondary | ICD-10-CM | POA: Insufficient documentation

## 2016-10-14 DIAGNOSIS — F17210 Nicotine dependence, cigarettes, uncomplicated: Secondary | ICD-10-CM | POA: Insufficient documentation

## 2016-10-14 LAB — COMPREHENSIVE METABOLIC PANEL
ALT: 11 U/L — ABNORMAL LOW (ref 14–54)
AST: 19 U/L (ref 15–41)
Albumin: 4.3 g/dL (ref 3.5–5.0)
Alkaline Phosphatase: 45 U/L (ref 38–126)
Anion gap: 8 (ref 5–15)
BUN: 5 mg/dL — ABNORMAL LOW (ref 6–20)
CO2: 27 mmol/L (ref 22–32)
Calcium: 9.3 mg/dL (ref 8.9–10.3)
Chloride: 105 mmol/L (ref 101–111)
Creatinine, Ser: 0.93 mg/dL (ref 0.44–1.00)
GFR calc Af Amer: >60 mL/min (ref >60)
GFR calc non Af Amer: >60 mL/min (ref >60)
Glucose, Bld: 94 mg/dL (ref 65–99)
Potassium: 4.2 mmol/L (ref 3.5–5.1)
Sodium: 140 mmol/L (ref 135–145)
Total Bilirubin: 0.6 mg/dL (ref 0.3–1.2)
Total Protein: 7.4 g/dL (ref 6.5–8.1)

## 2016-10-14 LAB — CBC WITH DIFFERENTIAL/PLATELET
Basophils Absolute: 0 10*3/uL (ref 0.0–0.1)
Basophils Relative: 0 %
Eosinophils Absolute: 0 10*3/uL (ref 0.0–0.7)
Eosinophils Relative: 1 %
HCT: 42.4 % (ref 36.0–46.0)
Hemoglobin: 13.9 g/dL (ref 12.0–15.0)
Lymphocytes Relative: 31 %
Lymphs Abs: 1.4 10*3/uL (ref 0.7–4.0)
MCH: 27.8 pg (ref 26.0–34.0)
MCHC: 32.8 g/dL (ref 30.0–36.0)
MCV: 84.8 fL (ref 78.0–100.0)
Monocytes Absolute: 0.4 10*3/uL (ref 0.1–1.0)
Monocytes Relative: 9 %
Neutro Abs: 2.7 10*3/uL (ref 1.7–7.7)
Neutrophils Relative %: 59 %
Platelets: 224 10*3/uL (ref 150–400)
RBC: 5 MIL/uL (ref 3.87–5.11)
RDW: 13 % (ref 11.5–15.5)
WBC: 4.6 10*3/uL (ref 4.0–10.5)

## 2016-10-14 LAB — LIPASE, BLOOD: Lipase: <10 U/L — ABNORMAL LOW (ref 11–51)

## 2016-10-14 MED ORDER — PROMETHAZINE HCL 25 MG PO TABS: 25.0000 mg | ORAL_TABLET | Freq: Three times a day (TID) | ORAL | 0 refills | Status: DC | PRN

## 2016-10-14 MED ORDER — PROMETHAZINE HCL 25 MG/ML IJ SOLN
25.0000 mg | Freq: Once | INTRAMUSCULAR | Status: AC
  Administered 2016-10-14: 25 mg via INTRAVENOUS
  Filled 2016-10-14: qty 1

## 2016-10-14 MED ORDER — IOPAMIDOL (ISOVUE-300) INJECTION 61%
INTRAVENOUS | Status: AC
  Administered 2016-10-14: 100 mL
  Filled 2016-10-14: qty 100

## 2016-10-14 MED ORDER — SODIUM CHLORIDE 0.9 % IV BOLUS (SEPSIS)
1000.0000 mL | Freq: Once | INTRAVENOUS | Status: AC
  Administered 2016-10-14: 1000 mL via INTRAVENOUS

## 2016-10-14 MED ORDER — MORPHINE SULFATE (PF) 4 MG/ML IV SOLN
4.0000 mg | Freq: Once | INTRAVENOUS | Status: AC
  Administered 2016-10-14: 4 mg via INTRAVENOUS
  Filled 2016-10-14: qty 1

## 2016-10-14 MED ORDER — ONDANSETRON 4 MG PO TBDP
ORAL_TABLET | ORAL | Status: AC
  Filled 2016-10-14: qty 1

## 2016-10-14 MED ORDER — ONDANSETRON 4 MG PO TBDP
4.0000 mg | ORAL_TABLET | Freq: Once | ORAL | Status: AC | PRN
  Administered 2016-10-14: 4 mg via ORAL

## 2016-10-14 NOTE — ED Triage Notes (Signed)
Pt reports last night began having abdominal cramping with emesis. Reports 16 episodes of vomiting,. Pt also having diarrhea

## 2016-10-14 NOTE — ED Notes (Signed)
Pt given ginger ale for PO challenge, will continue to monitor and reassess.

## 2016-10-14 NOTE — ED Notes (Signed)
The pts abd pain was better just  After she had med

## 2016-10-14 NOTE — ED Notes (Signed)
The  Pt is going to c-t now

## 2016-10-14 NOTE — Discharge Instructions (Signed)
Return here as needed.  Follow-up with a primary care doctor.  Slowly increase your fluid intake °

## 2016-10-14 NOTE — ED Notes (Signed)
Pt passed PO challenge, Thayer Ohmhris PA aware.

## 2016-10-14 NOTE — ED Notes (Signed)
thw pt is c/o  Vomiting all day and since last pm  She hashad diarrhea  She was given zofran at triage  And she stopped vomiting then still nauseated  lmp none

## 2016-10-14 NOTE — ED Provider Notes (Signed)
[facility] Provider Note   CSN: [id number] Arrival date & time: 10/14/16  1510     History   Chief Complaint Chief Complaint  Patient presents with  . Emesis  . Abdominal Pain    HPI Brenda Blankenship is a 31 y.o. female.  HPI Patient presents to the emergency department with nausea, vomiting and diarrhea since last night.  The patient states that she also has abdominal discomfort associated with this.  She states that she feels the left side of her abdomen is most tender.  Patient states that she did not take any medications prior to route.  It seems make the condition better or worseThe patient denies chest pain, shortness of breath, headache,blurred vision, neck pain, fever, cough, weakness, numbness, dizziness, anorexia, edema, rash, back pain, dysuria, hematemesis, bloody stool, near syncope, or syncope. Past Medical History:  Diagnosis Date  . Abnormal cervical Papanicolaou smear    cryo  . Anemia   . DUB (dysfunctional uterine bleeding)   . Environmental allergies   . Pregnancy induced hypertension   . Preterm labor     Patient Active Problem List   Diagnosis Date Noted  . Postoperative state 04/12/2014  . DUB (dysfunctional uterine bleeding) 03/08/2014  . Dysmenorrhea 03/08/2014    Past Surgical History:  Procedure Laterality Date  . BILATERAL SALPINGECTOMY Bilateral 04/12/2014   Procedure: BILATERAL SALPINGECTOMY;  Surgeon: Adam PhenixJames G Arnold, MD;  Location: [facility];  Service: Gynecology;  Laterality: Bilateral;  . CRYOTHERAPY    . LAPAROSCOPIC TUBAL LIGATION Bilateral 11/10/2013   Procedure: bilateral LAPAROSCOPIC TUBAL LIGATION with filshie clips;  Surgeon: Freddrick MarchKendra H. Tenny Crawoss, MD;  Location: [facility];  Service: Gynecology;  Laterality: Bilateral;  . TUBAL LIGATION    . VAGINAL HYSTERECTOMY N/A 04/12/2014   Procedure: HYSTERECTOMY VAGINAL;  Surgeon: Adam PhenixJames G Arnold, MD;  Location: [facility];  Service: Gynecology;  Laterality: N/A;    OB History    Gravida Para Term  Preterm AB Living   3 2 2  0 1 2   SAB TAB Ectopic Multiple Live Births   0 1 0 0 2       Home Medications    Prior to Admission medications   Medication Sig Start Date End Date Taking? Authorizing Provider  DM-Doxylamine-Acetaminophen (NYQUIL COLD & FLU PO) Take 1 capsule by mouth daily as needed (cold symptoms).   Yes Historical Provider, MD  hydrochlorothiazide (HYDRODIURIL) 25 MG tablet Take 25 mg by mouth daily.   Yes Historical Provider, MD  ibuprofen (ADVIL,MOTRIN) 600 MG tablet Take 1 tablet (600 mg total) by mouth every 6 (six) hours as needed. Patient taking differently: Take 600 mg by mouth every 6 (six) hours as needed for mild pain.  05/22/16  Yes Lora PaulaJennifer T Krall, MD  Pseudoephedrine-APAP-DM (DAYQUIL MULTI-SYMPTOM COLD/FLU PO) Take 1 capsule by mouth daily as needed (cold symptoms).   Yes Historical Provider, MD  ALPRAZolam Prudy Feeler(XANAX) 0.5 MG tablet Take 1 tablet (0.5 mg total) by mouth 3 (three) times daily as needed for anxiety. Patient not taking: Reported on 10/14/2016 01/14/15   Trixie DredgeEmily West, PA-C  metroNIDAZOLE (FLAGYL) 500 MG tablet Take 1 tablet (500 mg total) by mouth 2 (two) times daily. Patient not taking: Reported on 10/14/2016 05/22/16   Lora PaulaJennifer T Krall, MD    Family History Family History  Problem Relation Age of Onset  . Diabetes Maternal Grandmother   . Cancer Paternal Grandmother     started in lung  . Hypertension Paternal Grandmother   . Hypertension  Mother   . Hypertension Father   . Hyperlipidemia Father     Social History Social History  Substance Use Topics  . Smoking status: Current Every Day Smoker    Packs/day: 0.25    Years: 0.00    Types: Cigarettes  . Smokeless tobacco: Never Used     Comment: stopped with preg  . Alcohol use No     Allergies   Hydrocodone and Naprosyn [naproxen]   Review of Systems Review of Systems All other systems negative except as documented in the HPI. All pertinent positives and negatives as reviewed in  the HPI.  Physical Exam Updated Vital Signs BP (!) 156/107   Pulse 65   Temp 98.5 F (36.9 C)   Resp 19   LMP 03/09/2014   SpO2 100%   Physical Exam  Constitutional: She is oriented to person, place, and time. She appears well-developed and well-nourished. No distress.  HENT:  Head: Normocephalic and atraumatic.  Mouth/Throat: Oropharynx is clear and moist.  Eyes: Pupils are equal, round, and reactive to light.  Neck: Normal range of motion. Neck supple.  Cardiovascular: Normal rate, regular rhythm and normal heart sounds.  Exam reveals no gallop and no friction rub.   No murmur heard. Pulmonary/Chest: Effort normal and breath sounds normal. No respiratory distress. She has no wheezes.  Abdominal: Soft. Bowel sounds are normal. She exhibits no distension and no mass. There is tenderness. There is no rebound and no guarding.  Neurological: She is alert and oriented to person, place, and time. She exhibits normal muscle tone. Coordination normal.  Skin: Skin is warm and dry. Capillary refill takes less than 2 seconds. No rash noted. No erythema.  Psychiatric: She has a normal mood and affect. Her behavior is normal.  Nursing note and vitals reviewed.    ED Treatments / Results  Labs (all labs ordered are listed, but only abnormal results are displayed) Labs Reviewed  LIPASE, BLOOD - Abnormal; Notable for the following:       Result Value   Lipase <10 (*)    All other components within normal limits  COMPREHENSIVE METABOLIC PANEL - Abnormal; Notable for the following:    BUN 5 (*)    ALT 11 (*)    All other components within normal limits  CBC WITH DIFFERENTIAL/PLATELET    EKG  EKG Interpretation None       Radiology Ct Abdomen Pelvis W Contrast  Result Date: 10/14/2016 CLINICAL DATA:  Generalized abdominal pain, nausea, vomiting and diarrhea starting last night. EXAM: CT ABDOMEN AND PELVIS WITH CONTRAST TECHNIQUE: Multidetector CT imaging of the abdomen and pelvis  was performed using the standard protocol following bolus administration of intravenous contrast. CONTRAST:  ISOVUE-300 IOPAMIDOL (ISOVUE-300) INJECTION 61% COMPARISON:  CT abdomen dated 11/13/2013. FINDINGS: Lower chest: No acute abnormality. Hepatobiliary: No focal liver abnormality is seen. No gallstones, gallbladder wall thickening, or biliary dilatation. Pancreas: Unremarkable. No pancreatic ductal dilatation or surrounding inflammatory changes. Spleen: Normal in size without focal abnormality. Adrenals/Urinary Tract: The adrenal glands appear normal. Kidneys appear normal without mass, stone or hydronephrosis. No perinephric inflammation. No ureteral or bladder calculi identified. Bladder is decompressed. Stomach/Bowel: Bowel is normal in caliber. No bowel wall thickening or evidence of bowel wall inflammation seen. Appendix is normal. Vascular/Lymphatic: No significant vascular findings are present. No enlarged abdominal or pelvic lymph nodes. Reproductive: Tubal ligation clips within the bilateral adnexal regions. No mass or inflammatory change within either adnexal region. Status post hysterectomy. Other: No free  fluid or abscess collection. No free intraperitoneal air. Musculoskeletal: No acute or suspicious osseous finding. Superficial soft tissues are unremarkable. IMPRESSION: 1. No acute findings within the abdomen or pelvis. No bowel obstruction or evidence of bowel wall inflammation. No free fluid or abscess. No evidence of acute solid organ abnormality. No renal or ureteral calculi. Appendix is normal. 2. Tubal ligation clips in the pelvis. Electronically Signed   By: Bary Richard M.D.   On: 10/14/2016 19:32    Procedures Procedures (including critical care time)  Medications Ordered in ED Medications  ondansetron (ZOFRAN-ODT) 4 MG disintegrating tablet (not administered)  ondansetron (ZOFRAN-ODT) disintegrating tablet 4 mg (4 mg Oral Given 10/14/16 1521)  sodium chloride 0.9 % bolus  1,000 mL (1,000 mLs Intravenous New Bag/Given 10/14/16 1843)  morphine 4 MG/ML injection 4 mg (4 mg Intravenous Given 10/14/16 1845)  iopamidol (ISOVUE-300) 61 % injection (100 mLs  Contrast Given 10/14/16 1911)     Initial Impression / Assessment and Plan / ED Course  I have reviewed the triage vital signs and the nursing notes.  Pertinent labs & imaging results that were available during my care of the patient were reviewed by me and considered in my medical decision making (see chart for details).     Patient's CT scan and laboratory testing did not show any significant abnormality.  The patient is given IV fluids along with antiemetics.  She is feeling better at this time.  He is able tolerate oral fluids.  I feel she has gastroenteritis.  Told to return here as needed.  Patient agrees the plan and all questions were answered  Final Clinical Impressions(s) / ED Diagnoses   Final diagnoses:  None    New Prescriptions New Prescriptions   No medications on file     Charlestine Night, PA-C 10/16/16 0149    Loren Racer, MD 10/20/16 [phone]

## 2017-01-24 ENCOUNTER — Emergency Department (HOSPITAL_COMMUNITY): Payer: Self-pay

## 2017-01-24 ENCOUNTER — Encounter (HOSPITAL_COMMUNITY): Payer: Self-pay | Admitting: Emergency Medicine

## 2017-01-24 ENCOUNTER — Emergency Department (HOSPITAL_COMMUNITY)
Admission: EM | Admit: 2017-01-24 | Discharge: 2017-01-25 | Disposition: A | Discharge status: Home / Self Care | Payer: Self-pay | Attending: Emergency Medicine | Admitting: Emergency Medicine

## 2017-01-24 DIAGNOSIS — R072 Precordial pain: Secondary | ICD-10-CM | POA: Insufficient documentation

## 2017-01-24 DIAGNOSIS — I10 Essential (primary) hypertension: Secondary | ICD-10-CM | POA: Insufficient documentation

## 2017-01-24 DIAGNOSIS — F17210 Nicotine dependence, cigarettes, uncomplicated: Secondary | ICD-10-CM | POA: Insufficient documentation

## 2017-01-24 DIAGNOSIS — R2243 Localized swelling, mass and lump, lower limb, bilateral: Secondary | ICD-10-CM | POA: Insufficient documentation

## 2017-01-24 DIAGNOSIS — Z760 Encounter for issue of repeat prescription: Secondary | ICD-10-CM | POA: Insufficient documentation

## 2017-01-24 DIAGNOSIS — R51 Headache: Secondary | ICD-10-CM | POA: Insufficient documentation

## 2017-01-24 DIAGNOSIS — R42 Dizziness and giddiness: Secondary | ICD-10-CM | POA: Insufficient documentation

## 2017-01-24 LAB — CBC
HCT: 36 % (ref 36.0–46.0)
Hemoglobin: 12 g/dL (ref 12.0–15.0)
MCH: 28.1 pg (ref 26.0–34.0)
MCHC: 33.3 g/dL (ref 30.0–36.0)
MCV: 84.3 fL (ref 78.0–100.0)
Platelets: 214 10*3/uL (ref 150–400)
RBC: 4.27 MIL/uL (ref 3.87–5.11)
RDW: 13.5 % (ref 11.5–15.5)
WBC: 3.6 10*3/uL — ABNORMAL LOW (ref 4.0–10.5)

## 2017-01-24 LAB — BASIC METABOLIC PANEL
Anion gap: 6 (ref 5–15)
BUN: 10 mg/dL (ref 6–20)
CO2: 27 mmol/L (ref 22–32)
Calcium: 8.4 mg/dL — ABNORMAL LOW (ref 8.9–10.3)
Chloride: 106 mmol/L (ref 101–111)
Creatinine, Ser: 0.92 mg/dL (ref 0.44–1.00)
GFR calc Af Amer: >60 mL/min (ref >60)
GFR calc non Af Amer: >60 mL/min (ref >60)
Glucose, Bld: 87 mg/dL (ref 65–99)
Potassium: 3.8 mmol/L (ref 3.5–5.1)
Sodium: 139 mmol/L (ref 135–145)

## 2017-01-24 LAB — BRAIN NATRIURETIC PEPTIDE: B Natriuretic Peptide: 97.9 pg/mL (ref 0.0–100.0)

## 2017-01-24 LAB — POCT I-STAT TROPONIN I: Troponin i, poc: 0 ng/mL (ref 0.00–0.08)

## 2017-01-24 MED ORDER — GI COCKTAIL ~~LOC~~
30.0000 mL | Freq: Once | ORAL | Status: AC
  Administered 2017-01-25: 30 mL via ORAL
  Filled 2017-01-24: qty 30

## 2017-01-24 NOTE — ED Notes (Signed)
Pt c/o edema to bilat LE x 1 week, out of HCTZ x 1.5 weeks. Pt reports L sided CP, intermittent x 1 week, "tight" and "sharp". Pt denies injury, denies SHOB, denies fever, denies cough. A & O, NAD, on CCM, family at bedside

## 2017-01-24 NOTE — ED Notes (Signed)
Pt went to restroom before triage.

## 2017-01-24 NOTE — ED Provider Notes (Signed)
[facility]-EMERGENCY DEPT Provider Note   CSN: [id number] Arrival date & time: 01/24/17  1853     History   Chief Complaint Chief Complaint  Patient presents with  . Hypertension  . Chest Pain    HPI Brenda Blankenship is a 31 y.o. female with a hx of HTN presents to the Emergency Department complaining of intermittent chest pain with associated lightheadedness onset 1.5 weeks ago. Associated symptoms include bilateral foot and ankle swelling.  CP is sharp and located around her left breast.  She also has had a generalized throbbing headache.  Pt reports that the lightheadedness is not positional.  Nothing makes it better and nothing makes it worse.  Pt denies fever, chills, neck pain, neck stiffness, SOB, abd pain, N/V/D, weakness, syncope.  Pt reports she ran out of HCTZ 2 weeks ago.  Pt does not have a PCP.  Patient denies history of DVT, estrogen usage, P reason immobilization fracture or surgery. No palpitations.   The history is provided by the patient and medical records. No language interpreter was used.    Past Medical History:  Diagnosis Date  . Abnormal cervical Papanicolaou smear    cryo  . Anemia   . DUB (dysfunctional uterine bleeding)   . Environmental allergies   . Pregnancy induced hypertension   . Preterm labor     Patient Active Problem List   Diagnosis Date Noted  . Postoperative state 04/12/2014  . DUB (dysfunctional uterine bleeding) 03/08/2014  . Dysmenorrhea 03/08/2014    Past Surgical History:  Procedure Laterality Date  . BILATERAL SALPINGECTOMY Bilateral 04/12/2014   Procedure: BILATERAL SALPINGECTOMY;  Surgeon: Adam PhenixJames G Arnold, MD;  Location: [facility];  Service: Gynecology;  Laterality: Bilateral;  . CRYOTHERAPY    . LAPAROSCOPIC TUBAL LIGATION Bilateral 11/10/2013   Procedure: bilateral LAPAROSCOPIC TUBAL LIGATION with filshie clips;  Surgeon: Freddrick MarchKendra H. Tenny Crawoss, MD;  Location: [facility];  Service: Gynecology;  Laterality: Bilateral;  . TUBAL LIGATION    .  VAGINAL HYSTERECTOMY N/A 04/12/2014   Procedure: HYSTERECTOMY VAGINAL;  Surgeon: Adam PhenixJames G Arnold, MD;  Location: [facility];  Service: Gynecology;  Laterality: N/A;    OB History    Gravida Para Term Preterm AB Living   3 2 2  0 1 2   SAB TAB Ectopic Multiple Live Births   0 1 0 0 2       Home Medications    Prior to Admission medications   Medication Sig Start Date End Date Taking? Authorizing Provider  hydrochlorothiazide (HYDRODIURIL) 25 MG tablet Take 1 tablet (25 mg total) by mouth daily. 01/25/17   Keatyn Jawad, Dahlia ClientHannah, PA-C  omeprazole (PRILOSEC) 20 MG capsule Take 1 capsule (20 mg total) by mouth daily. 01/25/17   Safiyyah Vasconez, Dahlia ClientHannah, PA-C    Family History Family History  Problem Relation Age of Onset  . Diabetes Maternal Grandmother   . Cancer Paternal Grandmother        started in lung  . Hypertension Paternal Grandmother   . Hypertension Mother   . Hypertension Father   . Hyperlipidemia Father     Social History Social History  Substance Use Topics  . Smoking status: Current Every Day Smoker    Packs/day: 0.50    Years: 0.00    Types: Cigarettes  . Smokeless tobacco: Never Used     Comment: stopped with preg  . Alcohol use Yes     Comment: socially      Allergies   Hydrocodone and Naprosyn [naproxen]   Review  of Systems Review of Systems  Constitutional: Negative for appetite change, diaphoresis, fatigue, fever and unexpected weight change.  HENT: Negative for mouth sores.   Eyes: Negative for visual disturbance.  Respiratory: Negative for cough, chest tightness, shortness of breath and wheezing.   Cardiovascular: Positive for chest pain and leg swelling. Negative for palpitations.  Gastrointestinal: Negative for abdominal pain, constipation, diarrhea, nausea and vomiting.  Endocrine: Negative for polydipsia, polyphagia and polyuria.  Genitourinary: Negative for dysuria, frequency, hematuria and urgency.  Musculoskeletal: Negative for back pain and neck  stiffness.  Skin: Negative for rash.  Allergic/Immunologic: Negative for immunocompromised state.  Neurological: Positive for light-headedness and headaches. Negative for syncope.  Hematological: Does not bruise/bleed easily.  Psychiatric/Behavioral: Negative for sleep disturbance. The patient is not nervous/anxious.   All other systems reviewed and are negative.    Physical Exam Updated Vital Signs BP (!) 141/96   Pulse (!) 56   Temp 98.4 F (36.9 C) (Oral)   Resp 17   Ht 5\' 3"  (1.6 m)   Wt 65.8 kg (145 lb)   LMP 03/09/2014   SpO2 100%   BMI 25.69 kg/m   Physical Exam  Constitutional: She appears well-developed and well-nourished. No distress.  Awake, alert, nontoxic appearance  HENT:  Head: Normocephalic and atraumatic.  Mouth/Throat: Oropharynx is clear and moist. No oropharyngeal exudate.  Eyes: Conjunctivae are normal. No scleral icterus.  Neck: Normal range of motion. Neck supple.  Cardiovascular: Normal rate, regular rhythm and intact distal pulses.   Pulmonary/Chest: Effort normal and breath sounds normal. No respiratory distress. She has no wheezes. She exhibits tenderness.  Equal chest expansion Tenderness to palpation over the left chest.  Abdominal: Soft. Bowel sounds are normal. She exhibits no mass. There is no tenderness. There is no rebound and no guarding.  Musculoskeletal: Normal range of motion. She exhibits edema ( trace, nonpitting to the bilateral feet not extending above the ankles).  Neurological: She is alert.  Speech is clear and goal oriented Moves extremities without ataxia  Skin: Skin is warm and dry. She is not diaphoretic.  Psychiatric: She has a normal mood and affect.  Nursing note and vitals reviewed.    ED Treatments / Results  Labs (all labs ordered are listed, but only abnormal results are displayed) Labs Reviewed  BASIC METABOLIC PANEL - Abnormal; Notable for the following:       Result Value   Calcium 8.4 (*)    All other  components within normal limits  CBC - Abnormal; Notable for the following:    WBC 3.6 (*)    All other components within normal limits  BRAIN NATRIURETIC PEPTIDE  I-STAT TROPOININ, ED  POCT I-STAT TROPONIN I    ED ECG REPORT   Date: 01/25/2017  Rate: 63  Rhythm: normal sinus rhythm  QRS Axis: normal  Intervals: normal  ST/T Wave abnormalities: normal  Conduction Disutrbances:none  Old EKG Reviewed: unchanged  I have personally reviewed the EKG tracing and agree with the computerized printout as noted.   Radiology Dg Chest 2 View  Result Date: 01/24/2017 CLINICAL DATA:  Chest pain and shortness of breath. EXAM: CHEST  2 VIEW COMPARISON:  12/04/2014 FINDINGS: Cardiomediastinal silhouette is normal. Mediastinal contours appear intact. There is no evidence of focal airspace consolidation, pleural effusion or pneumothorax. Osseous structures are without acute abnormality. Soft tissues are grossly normal. IMPRESSION: No active cardiopulmonary disease. Electronically Signed   By: Ted Mcalpine M.D.   On: 01/24/2017 20:12    Procedures  Procedures (including critical care time)  Medications Ordered in ED Medications  gi cocktail (Maalox,Lidocaine,Donnatal) (30 mLs Oral Given 01/25/17 0041)  hydrochlorothiazide (HYDRODIURIL) tablet 25 mg (25 mg Oral Given 01/25/17 0040)     Initial Impression / Assessment and Plan / ED Course  I have reviewed the triage vital signs and the nursing notes.  Pertinent labs & imaging results that were available during my care of the patient were reviewed by me and considered in my medical decision making (see chart for details).     Patient with chest pain for several days and no hypertension medication for the last 2 weeks. Chest pain is not likely of cardiac or pulmonary etiology d/t presentation, PERC negative, VSS, no tracheal deviation, no JVD or new murmur, RRR, breath sounds equal bilaterally, EKG without acute abnormalities, negative  troponin, and negative CXR. BNP within normal limits. No evidence of vascular congestion on chest x-ray.  Patient will be given a refill of her hypertension medications. Long discussion about the importance of finding a primary care provider for continued evaluation and regular medication management.Pt has been advised to return to the ED if CP becomes exertional, associated with diaphoresis or nausea, radiates to left jaw/arm, worsens or becomes concerning in any way. Pt appears reliable for follow up and is agreeable to discharge.     Final Clinical Impressions(s) / ED Diagnoses   Final diagnoses:  Precordial pain  Medication refill  Essential hypertension    New Prescriptions Discharge Medication List as of 01/25/2017 12:24 AM    START taking these medications   Details  omeprazole (PRILOSEC) 20 MG capsule Take 1 capsule (20 mg total) by mouth daily., Starting Mon 01/25/2017, Print         Dalbert Stillings, [place], PA-C 01/25/17 0401    Jacalyn Lefevre, MD 01/27/17 [phone]

## 2017-01-24 NOTE — ED Notes (Signed)
Lab confirms specimen for BNP

## 2017-01-24 NOTE — ED Triage Notes (Signed)
Pt from home with c/o left sided chest pain with intermittent lightheadedness that began about 1 week ago. Pt states she ran out of her bp medication about a week and a half ago. Pt has clear lung and normal heart sounds. Strong bilateral radial pulses.

## 2017-01-25 MED ORDER — HYDROCHLOROTHIAZIDE 25 MG PO TABS: 25.0000 mg | ORAL_TABLET | Freq: Every day | ORAL | 0 refills | Status: DC

## 2017-01-25 MED ORDER — OMEPRAZOLE 20 MG PO CPDR: 20.0000 mg | DELAYED_RELEASE_CAPSULE | Freq: Every day | ORAL | 0 refills | Status: DC

## 2017-01-25 MED ORDER — HYDROCHLOROTHIAZIDE 25 MG PO TABS
25.0000 mg | ORAL_TABLET | Freq: Once | ORAL | Status: AC
  Administered 2017-01-25: 25 mg via ORAL
  Filled 2017-01-25: qty 1

## 2017-01-25 NOTE — Discharge Instructions (Signed)
1. Medications: HCTZ, omeprazole, usual home medications 2. Treatment: rest, drink plenty of fluids,  3. Follow Up: Please followup with your primary doctor in 3 days for discussion of your diagnoses and further evaluation after today's visit; if you do not have a primary care doctor use the resource guide provided to find one; Please return to the ER for worsening symptoms or other concerns

## 2017-03-15 ENCOUNTER — Emergency Department (HOSPITAL_COMMUNITY)
Admission: EM | Admit: 2017-03-15 | Discharge: 2017-03-15 | Disposition: A | Discharge status: Home / Self Care | Payer: Self-pay | Attending: Emergency Medicine | Admitting: Emergency Medicine

## 2017-03-15 DIAGNOSIS — Z5321 Procedure and treatment not carried out due to patient leaving prior to being seen by health care provider: Secondary | ICD-10-CM | POA: Insufficient documentation

## 2017-03-15 LAB — COMPREHENSIVE METABOLIC PANEL
ALT: 10 U/L — ABNORMAL LOW (ref 14–54)
AST: 16 U/L (ref 15–41)
Albumin: 3.1 g/dL — ABNORMAL LOW (ref 3.5–5.0)
Alkaline Phosphatase: 42 U/L (ref 38–126)
Anion gap: 4 — ABNORMAL LOW (ref 5–15)
BUN: 9 mg/dL (ref 6–20)
CO2: 29 mmol/L (ref 22–32)
Calcium: 8.4 mg/dL — ABNORMAL LOW (ref 8.9–10.3)
Chloride: 105 mmol/L (ref 101–111)
Creatinine, Ser: 0.93 mg/dL (ref 0.44–1.00)
GFR calc Af Amer: >60 mL/min (ref >60)
GFR calc non Af Amer: >60 mL/min (ref >60)
Glucose, Bld: 94 mg/dL (ref 65–99)
Potassium: 4.2 mmol/L (ref 3.5–5.1)
Sodium: 138 mmol/L (ref 135–145)
Total Bilirubin: 0.3 mg/dL (ref 0.3–1.2)
Total Protein: 5.7 g/dL — ABNORMAL LOW (ref 6.5–8.1)

## 2017-03-15 LAB — CBC
HCT: 35 % — ABNORMAL LOW (ref 36.0–46.0)
Hemoglobin: 11.6 g/dL — ABNORMAL LOW (ref 12.0–15.0)
MCH: 27.4 pg (ref 26.0–34.0)
MCHC: 33.1 g/dL (ref 30.0–36.0)
MCV: 82.7 fL (ref 78.0–100.0)
Platelets: 234 10*3/uL (ref 150–400)
RBC: 4.23 MIL/uL (ref 3.87–5.11)
RDW: 13.2 % (ref 11.5–15.5)
WBC: 5.2 10*3/uL (ref 4.0–10.5)

## 2017-03-15 LAB — I-STAT BETA HCG BLOOD, ED (MC, WL, AP ONLY): I-stat hCG, quantitative: <5 m[IU]/mL (ref <5)

## 2017-03-15 LAB — LIPASE, BLOOD: Lipase: 22 U/L (ref 11–51)

## 2017-03-15 NOTE — ED Notes (Signed)
Patient called to go back to room and no answer

## 2017-03-15 NOTE — ED Triage Notes (Signed)
Pt states that she has had a dental abscess on the L bottom side before and she feels like its leaking, causing her face to swell. States she also has abdominal pain with diarrhea and dark stools. Alert and oriented.

## 2017-03-15 NOTE — ED Notes (Signed)
Called patient to go back to a room and no answer. 

## 2017-03-15 NOTE — ED Notes (Signed)
Patient called to go to back and no answer.

## 2018-05-26 ENCOUNTER — Ambulatory Visit (HOSPITAL_COMMUNITY)
Admission: EM | Admit: 2018-05-26 | Discharge: 2018-05-26 | Disposition: A | Discharge status: Home / Self Care | Payer: BLUE CROSS/BLUE SHIELD | Attending: Family Medicine | Admitting: Family Medicine

## 2018-05-26 ENCOUNTER — Encounter (HOSPITAL_COMMUNITY): Payer: Self-pay | Admitting: Emergency Medicine

## 2018-05-26 DIAGNOSIS — R0789 Other chest pain: Secondary | ICD-10-CM | POA: Diagnosis not present

## 2018-05-26 DIAGNOSIS — M940 Chondrocostal junction syndrome [Tietze]: Secondary | ICD-10-CM

## 2018-05-26 MED ORDER — DICLOFENAC SODIUM 75 MG PO TBEC: 75.0000 mg | DELAYED_RELEASE_TABLET | Freq: Two times a day (BID) | ORAL | 0 refills | Status: DC

## 2018-05-26 MED ORDER — PREDNISONE 10 MG (21) PO TBPK: ORAL_TABLET | Freq: Every day | ORAL | 0 refills | Status: DC

## 2018-05-26 NOTE — ED Provider Notes (Signed)
[facility]   [id number] 05/26/18 Arrival Time: 1831  ASSESSMENT & PLAN:  1. Chest wall pain   2. Costochondritis    Meds ordered this encounter  Medications  . diclofenac (VOLTAREN) 75 MG EC tablet    Sig: Take 1 tablet (75 mg total) by mouth 2 (two) times daily.    Dispense:  14 tablet    Refill:  0  . predniSONE (STERAPRED UNI-PAK 21 TAB) 10 MG (21) TBPK tablet    Sig: Take by mouth daily. Take as directed.    Dispense:  21 tablet    Refill:  0   Reassured that I do not feel she is having any emergent or life-threatening medical problem at this time. Discussed. Trial of above medications.  Follow-up Information    [facility].   Specialty:  Urgent Care Why:  If symptoms worsen. Contact information: [address] [place] [address] [phone]         Reviewed expectations re: course of current medical issues. Questions answered. Outlined signs and symptoms indicating need for more acute intervention. Patient verbalized understanding. After Visit Summary given.  SUBJECTIVE: History from: patient. Brenda Blankenship is a 32 y.o. female who reports intermittent mild to moderate pain of her anterior chest; mostly upper but sometimes more central; describes as a "dull feeling" when present; without radiation. Injury/trama: no. Onset: gradual, a week ago. Symptoms have remained stable since beginning; no specific worsening Relieved by: nothing in particular. Worsened by: "sometimes when I pick up my daughter"; a 2 year old. Associated symptoms: none reported. Extremity sensation changes or weakness: none. Self treatment: occasional ibuprofen; feels this temporarily helps History of similar: no. Occasional heartburn but this feels different.  Past Surgical History:  Procedure Laterality Date  . BILATERAL SALPINGECTOMY Bilateral 04/12/2014   Procedure: BILATERAL SALPINGECTOMY;  Surgeon: Adam Phenix,  MD;  Location: [facility];  Service: Gynecology;  Laterality: Bilateral;  . CRYOTHERAPY    . LAPAROSCOPIC TUBAL LIGATION Bilateral 11/10/2013   Procedure: bilateral LAPAROSCOPIC TUBAL LIGATION with filshie clips;  Surgeon: Freddrick March. Tenny Craw, MD;  Location: [facility];  Service: Gynecology;  Laterality: Bilateral;  . TUBAL LIGATION    . VAGINAL HYSTERECTOMY N/A 04/12/2014   Procedure: HYSTERECTOMY VAGINAL;  Surgeon: Adam Phenix, MD;  Location: [facility];  Service: Gynecology;  Laterality: N/A;     ROS: As per HPI. All other systems negative.   OBJECTIVE:  Vitals:   05/26/18 1856  BP: (!) 147/80  Pulse: 87  Resp: 18  Temp: 97.8 F (36.6 C)  SpO2: 100%    General appearance: alert; no distress HEENT: [place]; AT Neck: FROM; supple; no midline cervical tenderness Extremities: . Upper and lower extremities are warm and well perfused; without swelling and with normal ROM CV: RRR without murmer Resp: unlabored respirations; CTAB Chest wall: reproducible tenderness over anterior upper chest wall; more on left; no gross deformities; no clavicle tenderness Abd: soft; non-tender Skin: warm and dry; no visible rashes Neurologic: gait normal; normal reflexes of RUE and LUE; normal sensation of RUE and LUE; normal strength of RUE and LUE Psychological: alert and cooperative; normal mood and affect  Allergies  Allergen Reactions  . Hydrocodone Other (See Comments)    Pt states that this medication causes her to pass out. Pt can take oxycodone   . Naprosyn [Naproxen] Itching and Rash    Pt can take ibuprofen and other NSAIDs    Past Medical History:  Diagnosis Date  . Abnormal cervical Papanicolaou smear    cryo  . Anemia   . DUB (dysfunctional uterine bleeding)   . Environmental allergies   . Pregnancy induced hypertension   . Preterm labor    Social History   Socioeconomic History  . Marital status: Legally Separated    Spouse name: Not on file  . Number of children: Not on file  . Years  of education: Not on file  . Highest education level: Not on file  Occupational History  . Not on file  Social Needs  . Financial resource strain: Not on file  . Food insecurity:    Worry: Not on file    Inability: Not on file  . Transportation needs:    Medical: Not on file    Non-medical: Not on file  Tobacco Use  . Smoking status: Current Every Day Smoker    Packs/day: 0.50    Years: 0.00    Pack years: 0.00    Types: Cigarettes  . Smokeless tobacco: Never Used  . Tobacco comment: stopped with preg  Substance and Sexual Activity  . Alcohol use: Yes    Comment: socially   . Drug use: No    Types: Marijuana    Comment: stopped with preg  . Sexual activity: Yes    Birth control/protection: Surgical, Condom  Lifestyle  . Physical activity:    Days per week: Not on file    Minutes per session: Not on file  . Stress: Not on file  Relationships  . Social connections:    Talks on phone: Not on file    Gets together: Not on file    Attends religious service: Not on file    Active member of club or organization: Not on file    Attends meetings of clubs or organizations: Not on file    Relationship status: Not on file  Other Topics Concern  . Not on file  Social History Narrative  . Not on file   Family History  Problem Relation Age of Onset  . Diabetes Maternal Grandmother   . Cancer Paternal Grandmother        started in lung  . Hypertension Paternal Grandmother   . Hypertension Mother   . Hypertension Father   . Hyperlipidemia Father    Past Surgical History:  Procedure Laterality Date  . BILATERAL SALPINGECTOMY Bilateral 04/12/2014   Procedure: BILATERAL SALPINGECTOMY;  Surgeon: Adam Phenix, MD;  Location: [facility];  Service: Gynecology;  Laterality: Bilateral;  . CRYOTHERAPY    . LAPAROSCOPIC TUBAL LIGATION Bilateral 11/10/2013   Procedure: bilateral LAPAROSCOPIC TUBAL LIGATION with filshie clips;  Surgeon: Freddrick March. Tenny Craw, MD;  Location: [facility];  Service:  Gynecology;  Laterality: Bilateral;  . TUBAL LIGATION    . VAGINAL HYSTERECTOMY N/A 04/12/2014   Procedure: HYSTERECTOMY VAGINAL;  Surgeon: Adam Phenix, MD;  Location: [facility];  Service: Gynecology;  Laterality: N/AMardella Layman, MD 06/01/18 [phone]

## 2018-05-26 NOTE — ED Triage Notes (Addendum)
Pt c/o chest pain x1 week. Pt thought it was indigestion pt states when she picks up her daughter it hurts. Pt states it feels like a muscle pull.

## 2019-02-09 ENCOUNTER — Emergency Department (HOSPITAL_COMMUNITY): Payer: Self-pay

## 2019-02-09 ENCOUNTER — Emergency Department (HOSPITAL_COMMUNITY)
Admission: EM | Admit: 2019-02-09 | Discharge: 2019-02-09 | Disposition: A | Discharge status: Home / Self Care | Payer: Self-pay | Attending: Emergency Medicine | Admitting: Emergency Medicine

## 2019-02-09 ENCOUNTER — Encounter (HOSPITAL_COMMUNITY): Payer: Self-pay

## 2019-02-09 ENCOUNTER — Other Ambulatory Visit: Payer: Self-pay

## 2019-02-09 DIAGNOSIS — R55 Syncope and collapse: Secondary | ICD-10-CM | POA: Insufficient documentation

## 2019-02-09 DIAGNOSIS — Z79899 Other long term (current) drug therapy: Secondary | ICD-10-CM | POA: Insufficient documentation

## 2019-02-09 DIAGNOSIS — F17210 Nicotine dependence, cigarettes, uncomplicated: Secondary | ICD-10-CM | POA: Insufficient documentation

## 2019-02-09 DIAGNOSIS — I10 Essential (primary) hypertension: Secondary | ICD-10-CM | POA: Insufficient documentation

## 2019-02-09 LAB — CBC WITH DIFFERENTIAL/PLATELET
Abs Immature Granulocytes: 0.01 10*3/uL (ref 0.00–0.07)
Basophils Absolute: 0 10*3/uL (ref 0.0–0.1)
Basophils Relative: 1 %
Eosinophils Absolute: 0.1 10*3/uL (ref 0.0–0.5)
Eosinophils Relative: 3 %
HCT: 39.5 % (ref 36.0–46.0)
Hemoglobin: 12.9 g/dL (ref 12.0–15.0)
Immature Granulocytes: 0 %
Lymphocytes Relative: 43 %
Lymphs Abs: 1.2 10*3/uL (ref 0.7–4.0)
MCH: 29.4 pg (ref 26.0–34.0)
MCHC: 32.7 g/dL (ref 30.0–36.0)
MCV: 90 fL (ref 80.0–100.0)
Monocytes Absolute: 0.4 10*3/uL (ref 0.1–1.0)
Monocytes Relative: 15 %
Neutro Abs: 1 10*3/uL — ABNORMAL LOW (ref 1.7–7.7)
Neutrophils Relative %: 38 %
Platelets: 232 10*3/uL (ref 150–400)
RBC: 4.39 MIL/uL (ref 3.87–5.11)
RDW: 13.4 % (ref 11.5–15.5)
WBC: 2.7 10*3/uL — ABNORMAL LOW (ref 4.0–10.5)
nRBC: 0 % (ref 0.0–0.2)

## 2019-02-09 LAB — BASIC METABOLIC PANEL
Anion gap: 8 (ref 5–15)
BUN: 11 mg/dL (ref 6–20)
CO2: 24 mmol/L (ref 22–32)
Calcium: 8.5 mg/dL — ABNORMAL LOW (ref 8.9–10.3)
Chloride: 107 mmol/L (ref 98–111)
Creatinine, Ser: 0.86 mg/dL (ref 0.44–1.00)
GFR calc Af Amer: >60 mL/min (ref >60)
GFR calc non Af Amer: >60 mL/min (ref >60)
Glucose, Bld: 82 mg/dL (ref 70–99)
Potassium: 4.3 mmol/L (ref 3.5–5.1)
Sodium: 139 mmol/L (ref 135–145)

## 2019-02-09 LAB — CBG MONITORING, ED: Glucose-Capillary: 85 mg/dL (ref 70–99)

## 2019-02-09 LAB — I-STAT BETA HCG BLOOD, ED (MC, WL, AP ONLY): I-stat hCG, quantitative: <5 m[IU]/mL (ref <5)

## 2019-02-09 MED ORDER — ONDANSETRON 4 MG PO TBDP
4.0000 mg | ORAL_TABLET | Freq: Once | ORAL | Status: AC
  Administered 2019-02-09: 4 mg via ORAL
  Filled 2019-02-09: qty 1

## 2019-02-09 MED ORDER — HYDROCHLOROTHIAZIDE 12.5 MG PO CAPS
25.0000 mg | ORAL_CAPSULE | Freq: Once | ORAL | Status: AC
  Administered 2019-02-09: 25 mg via ORAL
  Filled 2019-02-09: qty 2

## 2019-02-09 NOTE — ED Notes (Signed)
Discharge instructions reviewed with patient. Patient verbalizes understanding. VSS. Provided patient with education on low sodium diet and encouraged patient to follow up with provided PCP for HTN management. Patient verbalizes agreement.

## 2019-02-09 NOTE — ED Notes (Addendum)
PATIENT COMPLAINED OF DIZZINESS DURING ORTHOSTATICS SHE ALSO APPEARED UNSTEADY ON HER FEET

## 2019-02-09 NOTE — ED Provider Notes (Signed)
[facility]-EMERGENCY DEPT Provider Note   CSN: [id number] Arrival date & time: 02/09/19  0906     History   Chief Complaint Chief Complaint  Patient presents with  . Near Syncope    HPI Brenda Blankenship is a 33 y.o. female.     The history is provided by the patient and medical records. No language interpreter was used.  Near Syncope     33 year old female with history of anemia presenting for evaluation of several new syncopal episode.  Patient states last week she remembers standing, experienced "fuzzy sensation" in her head and found herself on the ground.  She did not recall passing out.  Yesterday she had one similar episode while she was walking from the bathroom to her bedroom when she once again had a fuzzy sensation in her head and had a near syncopal episode.  She has been endorse recurrent left temporal throbbing headache ongoing for the past 3 weeks.  Headache is mild to moderate pain is waxing and waning.  Does endorse mild headache at this time.  She attributed her headache to potential high blood pressure in which she does not take any medication for that.  She denies any significant precipitating symptoms prior to the near syncopal episode aside from the fuzzy sensation.  She did not recall any pain in her chest, heart palpitation, trouble breathing, focal numbness or weakness.  She denies any URI symptoms.  She denies any abnormal bleeding.  She has had prior hysterectomy.  No prior history of PE or DVT.  No recent sick contact.  She does not have a PCP.  Past Medical History:  Diagnosis Date  . Abnormal cervical Papanicolaou smear    cryo  . Anemia   . DUB (dysfunctional uterine bleeding)   . Environmental allergies   . Pregnancy induced hypertension   . Preterm labor     Patient Active Problem List   Diagnosis Date Noted  . Postoperative state 04/12/2014  . DUB (dysfunctional uterine bleeding) 03/08/2014  . Dysmenorrhea 03/08/2014     Past Surgical History:  Procedure Laterality Date  . BILATERAL SALPINGECTOMY Bilateral 04/12/2014   Procedure: BILATERAL SALPINGECTOMY;  Surgeon: Adam PhenixJames G Arnold, MD;  Location: [facility];  Service: Gynecology;  Laterality: Bilateral;  . CRYOTHERAPY    . LAPAROSCOPIC TUBAL LIGATION Bilateral 11/10/2013   Procedure: bilateral LAPAROSCOPIC TUBAL LIGATION with filshie clips;  Surgeon: Freddrick MarchKendra H. Tenny Crawoss, MD;  Location: [facility];  Service: Gynecology;  Laterality: Bilateral;  . TUBAL LIGATION    . VAGINAL HYSTERECTOMY N/A 04/12/2014   Procedure: HYSTERECTOMY VAGINAL;  Surgeon: Adam PhenixJames G Arnold, MD;  Location: [facility];  Service: Gynecology;  Laterality: N/A;     OB History    Gravida  3   Para  2   Term  2   Preterm  0   AB  1   Living  2     SAB  0   TAB  1   Ectopic  0   Multiple  0   Live Births  2            Home Medications    Prior to Admission medications   Medication Sig Start Date End Date Taking? Authorizing Provider  diclofenac (VOLTAREN) 75 MG EC tablet Take 1 tablet (75 mg total) by mouth 2 (two) times daily. 05/26/18   Mardella LaymanHagler, Brian, MD  hydrochlorothiazide (HYDRODIURIL) 25 MG tablet Take 1 tablet (25 mg total) by mouth daily. 01/25/17   Muthersbaugh,  Dahlia ClientHannah, PA-C  omeprazole (PRILOSEC) 20 MG capsule Take 1 capsule (20 mg total) by mouth daily. 01/25/17   Muthersbaugh, Dahlia ClientHannah, PA-C  predniSONE (STERAPRED UNI-PAK 21 TAB) 10 MG (21) TBPK tablet Take by mouth daily. Take as directed. 05/26/18   Mardella LaymanHagler, Brian, MD    Family History Family History  Problem Relation Age of Onset  . Diabetes Maternal Grandmother   . Cancer Paternal Grandmother        started in lung  . Hypertension Paternal Grandmother   . Hypertension Mother   . Hypertension Father   . Hyperlipidemia Father     Social History Social History   Tobacco Use  . Smoking status: Current Every Day Smoker    Packs/day: 0.50    Years: 0.00    Pack years: 0.00    Types: Cigarettes  . Smokeless tobacco:  Never Used  . Tobacco comment: stopped with preg  Substance Use Topics  . Alcohol use: Yes    Comment: socially   . Drug use: No    Types: Marijuana    Comment: stopped with preg     Allergies   Hydrocodone and Naprosyn [naproxen]   Review of Systems Review of Systems  Cardiovascular: Positive for near-syncope.  All other systems reviewed and are negative.    Physical Exam Updated Vital Signs BP (!) 156/98   Pulse (!) 57   Temp 98.1 F (36.7 C) (Oral)   Resp 17   Ht 5\' 3"  (1.6 m)   Wt 76.7 kg   LMP 03/09/2014   SpO2 99%   BMI 29.94 kg/m   Physical Exam Vitals signs and nursing note reviewed.  Constitutional:      General: She is not in acute distress.    Appearance: She is well-developed.  HENT:     Head: Normocephalic and atraumatic.     Comments: No temporal bruit    Nose: Nose normal.     Mouth/Throat:     Mouth: Mucous membranes are moist.  Eyes:     Extraocular Movements: Extraocular movements intact.     Conjunctiva/sclera: Conjunctivae normal.     Pupils: Pupils are equal, round, and reactive to light.  Neck:     Musculoskeletal: Neck supple. No neck rigidity or muscular tenderness.     Vascular: No carotid bruit.  Cardiovascular:     Rate and Rhythm: Normal rate and regular rhythm.  Pulmonary:     Effort: Pulmonary effort is normal.     Breath sounds: Normal breath sounds.  Abdominal:     Palpations: Abdomen is soft.     Tenderness: There is no abdominal tenderness.  Lymphadenopathy:     Cervical: No cervical adenopathy.  Skin:    Capillary Refill: Capillary refill takes less than 2 seconds.     Findings: No rash.  Neurological:     Mental Status: She is alert and oriented to person, place, and time.     GCS: GCS eye subscore is 4. GCS verbal subscore is 5. GCS motor subscore is 6.     Sensory: Sensation is intact.     Motor: Motor function is intact.     Coordination: Coordination is intact.     Gait: Gait is intact.  Psychiatric:         Mood and Affect: Mood normal.      ED Treatments / Results  Labs (all labs ordered are listed, but only abnormal results are displayed) Labs Reviewed  BASIC METABOLIC PANEL - Abnormal; Notable for the  following components:      Result Value   Calcium 8.5 (*)    All other components within normal limits  CBC WITH DIFFERENTIAL/PLATELET - Abnormal; Notable for the following components:   WBC 2.7 (*)    Neutro Abs 1.0 (*)    All other components within normal limits  CBG MONITORING, ED  I-STAT BETA HCG BLOOD, ED ([facility], [facility], [facility] ONLY)    EKG EKG Interpretation  Date/Time:  Thursday February 09 2019 09:37:01 EDT Ventricular Rate:  66 PR Interval:    QRS Duration: 77 QT Interval:  442 QTC Calculation: 464 R Axis:   57 Text Interpretation:  Sinus arrhythmia Consider left atrial enlargement Low voltage, precordial leads Baseline wander in lead(s) V3 No STEMI. Similar to prior.  Confirmed by Nanda Quinton [phone]) on 02/09/2019 9:44:55 AM   Radiology Ct Head Wo Contrast  Result Date: 02/09/2019 CLINICAL DATA:  Syncope EXAM: CT HEAD WITHOUT CONTRAST TECHNIQUE: Contiguous axial images were obtained from the base of the skull through the vertex without intravenous contrast. COMPARISON:  None. FINDINGS: Brain: No evidence of acute infarction, hemorrhage, hydrocephalus, extra-axial collection or mass lesion/mass effect. Vascular: No hyperdense vessel or unexpected calcification. Skull: Normal. Negative for fracture or focal lesion. Sinuses/Orbits: No acute finding. Other: None. IMPRESSION: No acute intracranial pathology. Electronically Signed   By: Eddie Candle M.D.   On: 02/09/2019 10:19    Procedures Procedures (including critical care time)  Medications Ordered in ED Medications  hydrochlorothiazide (MICROZIDE) capsule 25 mg (25 mg Oral Given 02/09/19 1103)     Initial Impression / Assessment and Plan / ED Course  I have reviewed the triage vital signs and the nursing notes.   Pertinent labs & imaging results that were available during my care of the patient were reviewed by me and considered in my medical decision making (see chart for details).        BP (!) 156/98   Pulse (!) 57   Temp 98.1 F (36.7 C) (Oral)   Resp 17   Ht 5\' 3"  (1.6 m)   Wt 76.7 kg   LMP 03/09/2014   SpO2 99%   BMI 29.94 kg/m    Final Clinical Impressions(s) / ED Diagnoses   Final diagnoses:  Near syncope  Hypertension, unspecified type    ED Discharge Orders    None     9:30 AM Patient here for several near syncopal episode.  No true syncope.  She also endorsed recurrent left temporal headache.  In the setting of near syncope and headache, will obtain head CT scan.  Work-up initiated.  10:28 AM No significant electrolyte derangement.  No anemia.  Pregnancy test is negative.  Normal CBG.  Normal orthostatic vital sign.  Head CT scan unremarkable.  Patient is hypertensive, however no evidence of endorgan damage.  11:20 AM Recommend outpt f/u with PCP for further evaluation as well as management of HTN.  Pt agrees.  Return precaution given.     Domenic Moras, PA-C 02/09/19 1121    Long, Wonda Olds, MD 02/09/19 [phone]

## 2019-02-09 NOTE — ED Triage Notes (Addendum)
Patient presents with increased dizziness and "passing out" twice over the past 2 weeks. Patient reports she just thought she "was too hot." Patient reports "I was talking to someone or came out of the bathroom, then I see little sparkles, and the next thing I know, I am on the floor." Patient unsure if hit the head on the floor due to syncope. Patient reports seeing "the sparkles or fuzzies" right now. Patient does report headache to bilateral temples.   Patient does report recent travel (7/9-11/20)  to [place], [place] for work. Patient reports "I was in a virtual training in my hotel room." Patient reports going to the grocery store while in [facility], but denies going to the beach/swimming pool/ being around large groups of people.

## 2019-02-09 NOTE — ED Notes (Signed)
Patient transported to CT scan . 

## 2019-02-09 NOTE — ED Notes (Signed)
This nurse was called to bedside. Patient reports nausea and dry heaving. EDPA made aware. SL Zofran ordered.

## 2019-05-06 ENCOUNTER — Emergency Department (HOSPITAL_COMMUNITY): Payer: Self-pay

## 2019-05-06 ENCOUNTER — Other Ambulatory Visit: Payer: Self-pay

## 2019-05-06 ENCOUNTER — Emergency Department (HOSPITAL_COMMUNITY)
Admission: EM | Admit: 2019-05-06 | Discharge: 2019-05-06 | Disposition: A | Discharge status: Home / Self Care | Payer: Self-pay | Attending: Emergency Medicine | Admitting: Emergency Medicine

## 2019-05-06 ENCOUNTER — Encounter (HOSPITAL_COMMUNITY): Payer: Self-pay

## 2019-05-06 DIAGNOSIS — R059 Cough, unspecified: Secondary | ICD-10-CM

## 2019-05-06 DIAGNOSIS — R0789 Other chest pain: Secondary | ICD-10-CM

## 2019-05-06 DIAGNOSIS — F17210 Nicotine dependence, cigarettes, uncomplicated: Secondary | ICD-10-CM | POA: Insufficient documentation

## 2019-05-06 DIAGNOSIS — Z79899 Other long term (current) drug therapy: Secondary | ICD-10-CM | POA: Insufficient documentation

## 2019-05-06 DIAGNOSIS — R05 Cough: Secondary | ICD-10-CM | POA: Insufficient documentation

## 2019-05-06 DIAGNOSIS — Z20828 Contact with and (suspected) exposure to other viral communicable diseases: Secondary | ICD-10-CM | POA: Insufficient documentation

## 2019-05-06 DIAGNOSIS — R0602 Shortness of breath: Secondary | ICD-10-CM | POA: Insufficient documentation

## 2019-05-06 DIAGNOSIS — R42 Dizziness and giddiness: Secondary | ICD-10-CM | POA: Insufficient documentation

## 2019-05-06 DIAGNOSIS — J40 Bronchitis, not specified as acute or chronic: Secondary | ICD-10-CM

## 2019-05-06 DIAGNOSIS — I10 Essential (primary) hypertension: Secondary | ICD-10-CM

## 2019-05-06 LAB — CBC WITH DIFFERENTIAL/PLATELET
Abs Immature Granulocytes: 0.01 10*3/uL (ref 0.00–0.07)
Basophils Absolute: 0 10*3/uL (ref 0.0–0.1)
Basophils Relative: 1 %
Eosinophils Absolute: 0.1 10*3/uL (ref 0.0–0.5)
Eosinophils Relative: 2 %
HCT: 40.5 % (ref 36.0–46.0)
Hemoglobin: 13.1 g/dL (ref 12.0–15.0)
Immature Granulocytes: 0 %
Lymphocytes Relative: 34 %
Lymphs Abs: 2.1 10*3/uL (ref 0.7–4.0)
MCH: 28.8 pg (ref 26.0–34.0)
MCHC: 32.3 g/dL (ref 30.0–36.0)
MCV: 89 fL (ref 80.0–100.0)
Monocytes Absolute: 0.5 10*3/uL (ref 0.1–1.0)
Monocytes Relative: 8 %
Neutro Abs: 3.4 10*3/uL (ref 1.7–7.7)
Neutrophils Relative %: 55 %
Platelets: 268 10*3/uL (ref 150–400)
RBC: 4.55 MIL/uL (ref 3.87–5.11)
RDW: 13.4 % (ref 11.5–15.5)
WBC: 6.2 10*3/uL (ref 4.0–10.5)
nRBC: 0.3 % — ABNORMAL HIGH (ref 0.0–0.2)

## 2019-05-06 LAB — BASIC METABOLIC PANEL
Anion gap: 12 (ref 5–15)
BUN: 8 mg/dL (ref 6–20)
CO2: 22 mmol/L (ref 22–32)
Calcium: 8.7 mg/dL — ABNORMAL LOW (ref 8.9–10.3)
Chloride: 105 mmol/L (ref 98–111)
Creatinine, Ser: 0.86 mg/dL (ref 0.44–1.00)
GFR calc Af Amer: >60 mL/min (ref >60)
GFR calc non Af Amer: >60 mL/min (ref >60)
Glucose, Bld: 89 mg/dL (ref 70–99)
Potassium: 4 mmol/L (ref 3.5–5.1)
Sodium: 139 mmol/L (ref 135–145)

## 2019-05-06 MED ORDER — GUAIFENESIN 100 MG/5ML PO SOLN
10.0000 mL | Freq: Once | ORAL | Status: AC
  Administered 2019-05-06: 200 mg via ORAL
  Filled 2019-05-06: qty 10

## 2019-05-06 MED ORDER — ACETAMINOPHEN 500 MG PO TABS
1000.0000 mg | ORAL_TABLET | Freq: Once | ORAL | Status: AC
  Administered 2019-05-06: 1000 mg via ORAL
  Filled 2019-05-06: qty 2

## 2019-05-06 MED ORDER — HYDROCHLOROTHIAZIDE 25 MG PO TABS: 25.0000 mg | ORAL_TABLET | Freq: Every day | ORAL | 0 refills | Status: DC

## 2019-05-06 MED ORDER — GUAIFENESIN 100 MG/5ML PO LIQD: 200.0000 mg | ORAL | 0 refills | Status: DC | PRN

## 2019-05-06 MED ORDER — ALBUTEROL SULFATE HFA 108 (90 BASE) MCG/ACT IN AERS
2.0000 | INHALATION_SPRAY | Freq: Once | RESPIRATORY_TRACT | Status: AC
  Administered 2019-05-06: 2 via RESPIRATORY_TRACT
  Filled 2019-05-06: qty 6.7

## 2019-05-06 MED ORDER — AMLODIPINE BESYLATE 5 MG PO TABS: 5.0000 mg | ORAL_TABLET | Freq: Every day | ORAL | 0 refills | Status: DC

## 2019-05-06 MED ORDER — AMLODIPINE BESYLATE 5 MG PO TABS
5.0000 mg | ORAL_TABLET | Freq: Once | ORAL | Status: DC
  Filled 2019-05-06: qty 1

## 2019-05-06 NOTE — Progress Notes (Signed)
TOC CM spoke to pt and she is agreeable to follow up appt at [facility]. Will call Monday to arrange her an appt. Provided pt with goodrx coupons for meds at [facility] for total of $20. Jonnie Finner RN [place], [place] ED TOC CM [phone]

## 2019-05-06 NOTE — ED Provider Notes (Signed)
[facility]-EMERGENCY DEPT Provider Note   CSN: [id number] Arrival date & time: 05/06/19  1121     History   Chief Complaint Chief Complaint  Patient presents with   Cough    HPI Brenda Blankenship is a 33 y.o. female.     Brenda Blankenship is a 33 y.o. female with history of hypertension and tobacco abuse, who presents to the ED for evaluation of cough.  Patient reports cough that has become productive over the past week.  She reports she now has chest pain with coughing and reports her chest feels sore all the time.  She reports some mild shortness of breath.  She has had some associated postnasal drainage, and sore throat.  She denies fevers or chills.  Denies abdominal pain, vomiting, or diarrhea.  No loss of taste or smell.  She reports that today during coughing spells that she has felt intermittently lightheaded.  Patient also expresses a lot of concern regarding her blood pressure.  Reports she has been on 25 mg HCTZ for a long time and feels that her blood pressure has been higher than usual recently.  Systolic in the 170s on arrival.  She has been unable to get follow-up appointment with community health and wellness for continued blood pressure management.  She denies headache or vision changes.  No persistent chest pain.  No lower extremity swelling.     Past Medical History:  Diagnosis Date   Abnormal cervical Papanicolaou smear    cryo   Anemia    DUB (dysfunctional uterine bleeding)    Environmental allergies    Pregnancy induced hypertension    Preterm labor     Patient Active Problem List   Diagnosis Date Noted   Postoperative state 04/12/2014   DUB (dysfunctional uterine bleeding) 03/08/2014   Dysmenorrhea 03/08/2014    Past Surgical History:  Procedure Laterality Date   BILATERAL SALPINGECTOMY Bilateral 04/12/2014   Procedure: BILATERAL SALPINGECTOMY;  Surgeon: Adam PhenixJames G Arnold, MD;  Location: [facility];  Service: Gynecology;   Laterality: Bilateral;   CRYOTHERAPY     LAPAROSCOPIC TUBAL LIGATION Bilateral 11/10/2013   Procedure: bilateral LAPAROSCOPIC TUBAL LIGATION with filshie clips;  Surgeon: Freddrick MarchKendra H. Tenny Crawoss, MD;  Location: [facility];  Service: Gynecology;  Laterality: Bilateral;   TUBAL LIGATION     VAGINAL HYSTERECTOMY N/A 04/12/2014   Procedure: HYSTERECTOMY VAGINAL;  Surgeon: Adam PhenixJames G Arnold, MD;  Location: [facility];  Service: Gynecology;  Laterality: N/A;     OB History    Gravida  3   Para  2   Term  2   Preterm  0   AB  1   Living  2     SAB  0   TAB  1   Ectopic  0   Multiple  0   Live Births  2            Home Medications    Prior to Admission medications   Medication Sig Start Date End Date Taking? Authorizing Provider  acetaminophen (TYLENOL) 500 MG tablet Take 1,000 mg by mouth every 6 (six) hours as needed for headache.    [provider]  amLODipine (NORVASC) 5 MG tablet Take 1 tablet (5 mg total) by mouth daily. 05/06/19   Dartha LodgeFord, Shaquana Buel N, PA-C  guaiFENesin (ROBITUSSIN) 100 MG/5ML liquid Take 10 mLs (200 mg total) by mouth every 4 (four) hours as needed for cough. 05/06/19   Dartha LodgeFord, Maahir Horst N, PA-C  hydrochlorothiazide (HYDRODIURIL) 25  MG tablet Take 1 tablet (25 mg total) by mouth daily. 05/06/19   Jacqlyn Larsen, PA-C  ibuprofen (ADVIL) 200 MG tablet Take 400 mg by mouth every 6 (six) hours as needed for headache.    [provider]  omeprazole (PRILOSEC) 20 MG capsule Take 1 capsule (20 mg total) by mouth daily. Patient not taking: Reported on 02/09/2019 01/25/17 02/09/19  Muthersbaugh, Jarrett Soho, PA-C    Family History Family History  Problem Relation Age of Onset   Diabetes Maternal Grandmother    Cancer Paternal Grandmother        started in lung   Hypertension Paternal Grandmother    Hypertension Mother    Hypertension Father    Hyperlipidemia Father     Social History Social History   Tobacco Use   Smoking status: Current Every Day Smoker     Packs/day: 0.50    Years: 0.00    Pack years: 0.00    Types: Cigarettes   Smokeless tobacco: Never Used   Tobacco comment: stopped with preg  Substance Use Topics   Alcohol use: Yes    Comment: socially    Drug use: No    Types: Marijuana    Comment: stopped with preg     Allergies   Hydrocodone, Tape, and Naprosyn [naproxen]   Review of Systems Review of Systems  Constitutional: Negative for chills and fever.  HENT: Positive for postnasal drip and sore throat.   Eyes: Negative for visual disturbance.  Respiratory: Positive for cough and shortness of breath.   Cardiovascular: Positive for chest pain. Negative for palpitations and leg swelling.  Gastrointestinal: Negative for abdominal pain, diarrhea, nausea and vomiting.  Genitourinary: Negative for dysuria and frequency.  Musculoskeletal: Negative for back pain and myalgias.  Skin: Negative for color change and rash.  Neurological: Negative for headaches.     Physical Exam Updated Vital Signs BP (!) 159/114    Pulse 66    Temp 98.5 F (36.9 C) (Oral)    Resp 18    LMP 03/09/2014    SpO2 94%   Physical Exam Vitals signs and nursing note reviewed.  Constitutional:      General: She is not in acute distress.    Appearance: Normal appearance. She is well-developed and normal weight. She is not ill-appearing or diaphoretic.     Comments: Patient well-appearing and in no distress  HENT:     Head: Normocephalic and atraumatic.     Nose:     Comments: Bilateral nares patent with moderate mucosal edema and clear rhinorrhea present.     Mouth/Throat:     Mouth: Mucous membranes are moist.     Pharynx: Oropharynx is clear.     Comments: Posterior oropharynx clear and mucous membranes moist, there is mild erythema but no edema or tonsillar exudates, uvula midline, normal phonation, no trismus, tolerating secretions without difficulty. Eyes:     General:        Right eye: No discharge.        Left eye: No  discharge.  Neck:     Musculoskeletal: Neck supple.     Comments: No rigidity Cardiovascular:     Rate and Rhythm: Normal rate and regular rhythm.     Heart sounds: Normal heart sounds.  Pulmonary:     Effort: Pulmonary effort is normal. No respiratory distress.     Breath sounds: Normal breath sounds.     Comments: Respirations equal and unlabored, patient able to speak in full sentences, lungs  clear to auscultation bilaterally, there is some tenderness over the costosternal margins bilaterally with palpation, she reports that reproduces the chest pain she has been having Abdominal:     General: Bowel sounds are normal. There is no distension.     Palpations: Abdomen is soft. There is no mass.     Tenderness: There is no abdominal tenderness. There is no guarding.     Comments: Abdomen soft, nondistended, nontender to palpation in all quadrants without guarding or peritoneal signs  Musculoskeletal:        General: No deformity.  Lymphadenopathy:     Cervical: No cervical adenopathy.  Skin:    General: Skin is warm and dry.     Capillary Refill: Capillary refill takes less than 2 seconds.  Neurological:     Mental Status: She is alert and oriented to person, place, and time.  Psychiatric:        Mood and Affect: Mood normal.        Behavior: Behavior normal.      ED Treatments / Results  Labs (all labs ordered are listed, but only abnormal results are displayed) Labs Reviewed  CBC WITH DIFFERENTIAL/PLATELET - Abnormal; Notable for the following components:      Result Value   nRBC 0.3 (*)    All other components within normal limits  BASIC METABOLIC PANEL - Abnormal; Notable for the following components:   Calcium 8.7 (*)    All other components within normal limits  NOVEL CORONAVIRUS, NAA (HOSP ORDER, SEND-OUT TO REF LAB; TAT 18-24 HRS)    EKG None  Radiology Dg Chest 2 View  Result Date: 05/06/2019 CLINICAL DATA:  Productive cough. EXAM: CHEST - 2 VIEW  COMPARISON:  January 24, 2017. FINDINGS: The heart size and mediastinal contours are within normal limits. Both lungs are clear. The visualized skeletal structures are unremarkable. IMPRESSION: No active cardiopulmonary disease. Electronically Signed   By: Lupita Raider M.D.   On: 05/06/2019 12:29    Procedures Procedures (including critical care time)  Medications Ordered in ED Medications  amLODipine (NORVASC) tablet 5 mg (5 mg Oral Not Given 05/06/19 1518)  albuterol (VENTOLIN HFA) 108 (90 Base) MCG/ACT inhaler 2 puff (2 puffs Inhalation Given 05/06/19 1330)  guaiFENesin (ROBITUSSIN) 100 MG/5ML solution 200 mg (200 mg Oral Given 05/06/19 1333)  acetaminophen (TYLENOL) tablet 1,000 mg (1,000 mg Oral Given 05/06/19 1332)     Initial Impression / Assessment and Plan / ED Course  I have reviewed the triage vital signs and the nursing notes.  Pertinent labs & imaging results that were available during my care of the patient were reviewed by me and considered in my medical decision making (see chart for details).  33 year old female presents with cough with associated intermittent chest pain and shortness of breath.  Patient also expresses a lot of concern about elevated blood pressure, reports she has been on HCTZ for a long time but feels it is no longer helping her blood pressure.  Her chest x-ray today is clear, EKG is unremarkable and lab work is reassuring, no leukocytosis, no electrolyte derangements, no elevated creatinine to suggest endorgan damage from hypertension.  Her cough significantly improved with albuterol and Robitussin, I do suspect this is likely bronchitis, patient has a long tobacco history which is probably exacerbating this.  She has no hypoxia.  Will add amlodipine 5 mg to patient's blood pressure regimen and have her follow closely with community health and wellness, case management was able to help  patient secure appointment.  Discussed monitoring blood pressure closely,  and discussed signs of hypotension, and holding blood pressure medication if need be.  COVID swab pending.  Patient feeling much better.  Discharged home in good condition.  MANON BANBURY was evaluated in Emergency Department on 05/06/2019 for the symptoms described in the history of present illness. She was evaluated in the context of the global COVID-19 pandemic, which necessitated consideration that the patient might be at risk for infection with the SARS-CoV-2 virus that causes COVID-19. Institutional protocols and algorithms that pertain to the evaluation of patients at risk for COVID-19 are in a state of rapid change based on information released by regulatory bodies including the CDC and federal and state organizations. These policies and algorithms were followed during the patient's care in the ED.  Final Clinical Impressions(s) / ED Diagnoses   Final diagnoses:  Cough  Bronchitis  Atypical chest pain  Hypertension, unspecified type    ED Discharge Orders         Ordered    hydrochlorothiazide (HYDRODIURIL) 25 MG tablet  Daily     05/06/19 1459    amLODipine (NORVASC) 5 MG tablet  Daily     05/06/19 1459    guaiFENesin (ROBITUSSIN) 100 MG/5ML liquid  Every 4 hours PRN     05/06/19 1459           Dartha Lodge, [place] 05/06/19 1647    Lorre Nick, MD 05/07/19 1407

## 2019-05-06 NOTE — ED Triage Notes (Signed)
Pt presents with c/o cough for approx one week. Pt reports the cough is productive.

## 2019-05-06 NOTE — ED Notes (Signed)
Patient is very anxious about her blood pressure. RN encouraged patient to try and stay calm and that she had informed the PA about the elevated BP.  Patient voiced understanding

## 2019-05-06 NOTE — Discharge Instructions (Addendum)
Your work-up today was reassuring.  Continue taking HCTZ daily and add 5 mg of Norvasc daily to her blood pressure regimen, monitor your blood pressure at home and follow-up with community health and wellness.  For cough use

## 2019-05-08 ENCOUNTER — Telehealth: Payer: Self-pay | Admitting: *Deleted

## 2019-05-08 LAB — NOVEL CORONAVIRUS, NAA (HOSP ORDER, SEND-OUT TO REF LAB; TAT 18-24 HRS): SARS-CoV-2, NAA: NOT DETECTED

## 2019-05-08 NOTE — Telephone Encounter (Signed)
TOC CM contact notified pt of appt on 05/29/2019 at 9:30 am at [facility]. [place], [place] ED TOC CM [phone]

## 2019-05-29 ENCOUNTER — Inpatient Hospital Stay (INDEPENDENT_AMBULATORY_CARE_PROVIDER_SITE_OTHER): Payer: Self-pay | Admitting: Primary Care

## 2019-07-10 ENCOUNTER — Encounter (INDEPENDENT_AMBULATORY_CARE_PROVIDER_SITE_OTHER): Payer: Self-pay | Admitting: Primary Care

## 2019-08-02 NOTE — Progress Notes (Signed)
This encounter was created in error - please disregard.

## 2019-09-12 ENCOUNTER — Ambulatory Visit (HOSPITAL_COMMUNITY)
Admission: EM | Admit: 2019-09-12 | Discharge: 2019-09-12 | Disposition: A | Discharge status: Home / Self Care | Payer: Self-pay | Attending: Physician Assistant | Admitting: Physician Assistant

## 2019-09-12 ENCOUNTER — Other Ambulatory Visit: Payer: Self-pay

## 2019-09-12 ENCOUNTER — Encounter (HOSPITAL_COMMUNITY): Payer: Self-pay | Admitting: Emergency Medicine

## 2019-09-12 DIAGNOSIS — B353 Tinea pedis: Secondary | ICD-10-CM

## 2019-09-12 MED ORDER — TERBINAFINE HCL 1 % EX CREA: 1.0000 "application " | TOPICAL_CREAM | Freq: Two times a day (BID) | CUTANEOUS | 0 refills | Status: DC

## 2019-09-12 MED ORDER — IBUPROFEN 800 MG PO TABS: 800.0000 mg | ORAL_TABLET | Freq: Three times a day (TID) | ORAL | 0 refills | Status: DC

## 2019-09-12 MED ORDER — ACETAMINOPHEN 500 MG PO TABS: 1000.0000 mg | ORAL_TABLET | Freq: Three times a day (TID) | ORAL | 0 refills | Status: DC | PRN

## 2019-09-12 NOTE — ED Triage Notes (Signed)
Callus under toes, in between toes bilateral foot issue

## 2019-09-12 NOTE — ED Provider Notes (Signed)
[facility]    CSN: [id number] Arrival date & time: 09/12/19  1328      History   Chief Complaint Chief Complaint  Patient presents with  . Appointment    13:30  . Callouses    HPI Brenda Blankenship is a 34 y.o. female.   Patient reports to urgent care today for painful callus under right 3rd toe and between 4th and 5th toes on left foot. She reports these have been present for some time but have become significantly more painful over the last 1-2 weeks. She reports shaving down the callus under her toe on the right foot, however it is still painful to walk on. She reports utilizing several different over the counter medications on the spot between her toes on the left foot, but has not tried the same treatments for extended periods. This spot has become very painful to the point it can be difficult to walk. She denies injury to the toes or skin. Denies history of diabetes. Denies similar incidents in the past.     Past Medical History:  Diagnosis Date  . Abnormal cervical Papanicolaou smear    cryo  . Anemia   . DUB (dysfunctional uterine bleeding)   . Environmental allergies   . Pregnancy induced hypertension   . Preterm labor     Patient Active Problem List   Diagnosis Date Noted  . Postoperative state 04/12/2014  . DUB (dysfunctional uterine bleeding) 03/08/2014  . Dysmenorrhea 03/08/2014    Past Surgical History:  Procedure Laterality Date  . BILATERAL SALPINGECTOMY Bilateral 04/12/2014   Procedure: BILATERAL SALPINGECTOMY;  Surgeon: Woodroe Mode, MD;  Location: [facility];  Service: Gynecology;  Laterality: Bilateral;  . CRYOTHERAPY    . LAPAROSCOPIC TUBAL LIGATION Bilateral 11/10/2013   Procedure: bilateral LAPAROSCOPIC TUBAL LIGATION with filshie clips;  Surgeon: Farrel Gobble. Harrington Challenger, MD;  Location: [facility];  Service: Gynecology;  Laterality: Bilateral;  . TUBAL LIGATION    . VAGINAL HYSTERECTOMY N/A 04/12/2014   Procedure: HYSTERECTOMY VAGINAL;  Surgeon:  Woodroe Mode, MD;  Location: [facility];  Service: Gynecology;  Laterality: N/A;    OB History    Gravida  3   Para  2   Term  2   Preterm  0   AB  1   Living  2     SAB  0   TAB  1   Ectopic  0   Multiple  0   Live Births  2            Home Medications    Prior to Admission medications   Medication Sig Start Date End Date Taking? Authorizing Provider  amLODipine (NORVASC) 5 MG tablet Take 1 tablet (5 mg total) by mouth daily. 05/06/19  Yes Jacqlyn Larsen, PA-C  hydrochlorothiazide (HYDRODIURIL) 25 MG tablet Take 1 tablet (25 mg total) by mouth daily. 05/06/19  Yes Jacqlyn Larsen, PA-C  acetaminophen (TYLENOL) 500 MG tablet Take 2 tablets (1,000 mg total) by mouth every 8 (eight) hours as needed. 09/12/19   Ayjah Show, Marguerita Beards, PA-C  guaiFENesin (ROBITUSSIN) 100 MG/5ML liquid Take 10 mLs (200 mg total) by mouth every 4 (four) hours as needed for cough. 05/06/19   Jacqlyn Larsen, PA-C  ibuprofen (ADVIL) 800 MG tablet Take 1 tablet (800 mg total) by mouth 3 (three) times daily. 09/12/19   Nadyne Gariepy, Marguerita Beards, PA-C  terbinafine (LAMISIL AT) 1 % cream Apply 1 application topically 2 (two) times daily. 09/12/19  Kalen Ratajczak, Veryl Speak, PA-C  omeprazole (PRILOSEC) 20 MG capsule Take 1 capsule (20 mg total) by mouth daily. Patient not taking: Reported on 02/09/2019 01/25/17 02/09/19  Muthersbaugh, Dahlia Client, PA-C    Family History Family History  Problem Relation Age of Onset  . Diabetes Maternal Grandmother   . Cancer Paternal Grandmother        started in lung  . Hypertension Paternal Grandmother   . Hypertension Mother   . Hypertension Father   . Hyperlipidemia Father     Social History Social History   Tobacco Use  . Smoking status: Current Every Day Smoker    Packs/day: 0.50    Years: 0.00    Pack years: 0.00    Types: Cigarettes  . Smokeless tobacco: Never Used  . Tobacco comment: stopped with preg  Substance Use Topics  . Alcohol use: Yes    Comment: socially   . Drug use: No     Types: Marijuana    Comment: stopped with preg     Allergies   Hydrocodone, Tape, and Naprosyn [naproxen]   Review of Systems Review of Systems  Constitutional: Negative for chills and fever.  Musculoskeletal: Positive for gait problem.  Skin: Positive for color change and wound.  All other systems reviewed and are negative.    Physical Exam Triage Vital Signs ED Triage Vitals  Enc Vitals Group     BP 09/12/19 1400 128/79     Pulse Rate 09/12/19 1400 77     Resp 09/12/19 1400 17     Temp 09/12/19 1400 98.8 F (37.1 C)     Temp Source 09/12/19 1400 Oral     SpO2 09/12/19 1400 97 %     Weight --      Height --      Head Circumference --      Peak Flow --      Pain Score 09/12/19 1358 10     Pain Loc --      Pain Edu? --      Excl. in GC? --    No data found.  Updated Vital Signs BP 128/79 (BP Location: Left Arm)   Pulse 77   Temp 98.8 F (37.1 C) (Oral)   Resp 17   LMP 03/09/2014   SpO2 97%   Visual Acuity Right Eye Distance:   Left Eye Distance:   Bilateral Distance:    Right Eye Near:   Left Eye Near:    Bilateral Near:     Physical Exam Vitals and nursing note reviewed.  Constitutional:      General: She is not in acute distress.    Appearance: Normal appearance. She is well-developed. She is not ill-appearing.  HENT:     Head: Normocephalic and atraumatic.  Eyes:     General: No scleral icterus.    Conjunctiva/sclera: Conjunctivae normal.     Pupils: Pupils are equal, round, and reactive to light.  Cardiovascular:     Rate and Rhythm: Normal rate.  Pulmonary:     Effort: Pulmonary effort is normal.  Abdominal:     Tenderness: There is no abdominal tenderness.  Musculoskeletal:        General: Normal range of motion.     Cervical back: Neck supple.     Comments: Right foot- 0.5cm in diameter callus under 3rd toe, at PIP. No erythema or evidence of abscess  Left foot- Hardened skin with callus with central crack between 4th and 5th  toe. Has appearance of tinea  infection.   Skin:    General: Skin is warm and dry.  Neurological:     General: No focal deficit present.     Mental Status: She is alert and oriented to person, place, and time.  Psychiatric:        Mood and Affect: Mood normal.        Behavior: Behavior normal.        Thought Content: Thought content normal.        Judgment: Judgment normal.      UC Treatments / Results  Labs (all labs ordered are listed, but only abnormal results are displayed) Labs Reviewed - No data to display  EKG   Radiology No results found.  Procedures Procedures (including critical care time)  Medications Ordered in UC Medications - No data to display  Initial Impression / Assessment and Plan / UC Course  I have reviewed the triage vital signs and the nursing notes.  Pertinent labs & imaging results that were available during my care of the patient were reviewed by me and considered in my medical decision making (see chart for details).     #Tinea pedis Left foot Patient is a 34 year old female without major medical history presenting with concern of foot lesion. Lesion on right foot appears to be callus only with out evidence of tinea infection, however lesion on left foot does appear to have have began as tinea infection. Instructed patient to begin lamisil daily and follow up with podiatry to further evaluation. Tylenol and ibuprofen for pain. Patient agrees to plan.  Final Clinical Impressions(s) / UC Diagnoses   Final diagnoses:  Tinea pedis of left foot     Discharge Instructions     Apply the Lamisil daily until symptoms resolve.   Please follow up with the podiatry office supplied to discuss further management of your callus and fungal infections      ED Prescriptions    Medication Sig Dispense Auth. Provider   terbinafine (LAMISIL AT) 1 % cream Apply 1 application topically 2 (two) times daily. 30 g Alma Mohiuddin, Veryl Speak, PA-C   ibuprofen (ADVIL)  800 MG tablet Take 1 tablet (800 mg total) by mouth 3 (three) times daily. 21 tablet Muriel Hannold, Veryl Speak, PA-C   acetaminophen (TYLENOL) 500 MG tablet Take 2 tablets (1,000 mg total) by mouth every 8 (eight) hours as needed. 30 tablet Barton Want, Veryl Speak, PA-C     PDMP not reviewed this encounter.   Hermelinda Medicus, PA-C 09/12/19 2337

## 2019-09-12 NOTE — Discharge Instructions (Addendum)
Apply the Lamisil daily until symptoms resolve.   Please follow up with the podiatry office supplied to discuss further management of your callus and fungal infections

## 2019-09-13 ENCOUNTER — Other Ambulatory Visit: Payer: Self-pay | Admitting: Podiatry

## 2019-09-13 ENCOUNTER — Ambulatory Visit: Payer: Self-pay | Admitting: Podiatry

## 2019-09-13 ENCOUNTER — Encounter: Payer: Self-pay | Admitting: Podiatry

## 2019-09-13 ENCOUNTER — Ambulatory Visit (INDEPENDENT_AMBULATORY_CARE_PROVIDER_SITE_OTHER): Payer: Self-pay

## 2019-09-13 DIAGNOSIS — L989 Disorder of the skin and subcutaneous tissue, unspecified: Secondary | ICD-10-CM

## 2019-09-13 DIAGNOSIS — M7751 Other enthesopathy of right foot: Secondary | ICD-10-CM

## 2019-09-13 DIAGNOSIS — M79672 Pain in left foot: Secondary | ICD-10-CM

## 2019-09-13 DIAGNOSIS — M79671 Pain in right foot: Secondary | ICD-10-CM

## 2019-09-17 NOTE — Progress Notes (Signed)
   Subjective: 34 y.o. female presenting to the office today as a new patient with a chief complaint of bilateral foot pain that began a few months ago. She believes the pain is coming from callus lesions located on the sub-second MPJ bilaterally and between the 4th and 5th digits of the left foot. She has not had any treatment for the symptoms. Being on the feet increases the pain. Patient is here for further evaluation and treatment.   Past Medical History:  Diagnosis Date  . Abnormal cervical Papanicolaou smear    cryo  . Anemia   . DUB (dysfunctional uterine bleeding)   . Environmental allergies   . Pregnancy induced hypertension   . Preterm labor      Objective:  Physical Exam General: Alert and oriented x3 in no acute distress  Dermatology: Hyperkeratotic lesion(s) present on the sub-second MPJ of the right foot. Pain on palpation with a central nucleated core noted. Hyperkeratotic tissue noted to the 4th interdigital webspace of the left foot. Skin is warm, dry and supple bilateral lower extremities. Negative for open lesions or macerations.  Vascular: Palpable pedal pulses bilaterally. No edema or erythema noted. Capillary refill within normal limits.  Neurological: Epicritic and protective threshold grossly intact bilaterally.   Musculoskeletal Exam: Pain on palpation at the keratotic lesion(s) noted as well as to the right 2nd toe. Range of motion within normal limits bilateral. Muscle strength 5/5 in all groups bilateral.  Radiographic Exam:  Normal osseous mineralization. Joint spaces preserved. No fracture/dislocation/boney destruction.    Assessment: 1. Heloma Molle left  2. Callus lesion noted to the sub-second MPJ right  3. Capsulitis right 2nd toe   Plan of Care:  1. Patient evaluated. X-Rays reviewed.  2. Excisional debridement of keratoic lesion(s) bilaterally using a chisel blade was performed without incident.  3. Dressed area with light dressing. 4.  Injection of 0.5 mLs Celestone Soluspan injected into the right 2nd toe.  5. Patient is to return to the clinic PRN.   Felecia Shelling, DPM [facility]  Dr. Felecia Shelling, DPM    [address]                                        [place], [place] [address]                Office [phone]  Fax [phone]

## 2020-01-22 ENCOUNTER — Ambulatory Visit: Payer: Self-pay | Admitting: Podiatry

## 2020-01-25 DIAGNOSIS — Z419 Encounter for procedure for purposes other than remedying health state, unspecified: Secondary | ICD-10-CM | POA: Diagnosis not present

## 2020-02-25 DIAGNOSIS — Z419 Encounter for procedure for purposes other than remedying health state, unspecified: Secondary | ICD-10-CM | POA: Diagnosis not present

## 2020-02-28 ENCOUNTER — Ambulatory Visit: Payer: Medicaid Other | Admitting: Podiatry

## 2020-03-27 DIAGNOSIS — Z419 Encounter for procedure for purposes other than remedying health state, unspecified: Secondary | ICD-10-CM | POA: Diagnosis not present

## 2020-04-26 DIAGNOSIS — Z419 Encounter for procedure for purposes other than remedying health state, unspecified: Secondary | ICD-10-CM | POA: Diagnosis not present

## 2020-05-27 DIAGNOSIS — Z419 Encounter for procedure for purposes other than remedying health state, unspecified: Secondary | ICD-10-CM | POA: Diagnosis not present

## 2020-06-26 DIAGNOSIS — Z419 Encounter for procedure for purposes other than remedying health state, unspecified: Secondary | ICD-10-CM | POA: Diagnosis not present

## 2020-07-01 ENCOUNTER — Other Ambulatory Visit: Payer: Self-pay

## 2020-07-01 ENCOUNTER — Ambulatory Visit (HOSPITAL_COMMUNITY)
Admission: RE | Admit: 2020-07-01 | Discharge: 2020-07-01 | Disposition: A | Discharge status: Home / Self Care | Payer: Medicaid Other | Source: Ambulatory Visit | Attending: Internal Medicine | Admitting: Internal Medicine

## 2020-07-01 ENCOUNTER — Encounter (HOSPITAL_COMMUNITY): Payer: Self-pay

## 2020-07-01 VITALS — BP 156/96 | HR 61 | Temp 99.3°F | Resp 17

## 2020-07-01 DIAGNOSIS — S93402D Sprain of unspecified ligament of left ankle, subsequent encounter: Secondary | ICD-10-CM

## 2020-07-01 MED ORDER — PREDNISONE 20 MG PO TABS: 20.0000 mg | ORAL_TABLET | Freq: Every day | ORAL | 0 refills | Status: AC

## 2020-07-01 NOTE — Discharge Instructions (Addendum)
You advised to wear your ankle support more often Follow-up with your foot doctor to further evaluate your ankle If symptoms worsen you will benefit from MRI of the ankle which can be arranged through your foot and ankle doctor. Rest, icing, elevation and range of motion exercises will help strengthen your ankle.

## 2020-07-01 NOTE — ED Triage Notes (Signed)
Pt presents with bilateral ankle pain and soreness sin the legs. Reports she fell over 6 times in the past month. Pt states ankles "gives out". Pt denies any headache, vision changes, loss of consciousness, when she fell.

## 2020-07-03 NOTE — ED Provider Notes (Signed)
[facility]    CSN: [id number] Arrival date & time: 07/01/20  1645      History   Chief Complaint Chief Complaint  Patient presents with  . Ankle Pain  . Fall    HPI Brenda Blankenship is a 34 y.o. female comes to the urgent care with complaints of bilateral ankle pain and soreness in the legs over the past several months.  Patient says that she has frequent falls because her left ankle gives way when she is walking resulting in falls.  She fell 6 times in the past month but denies ever hitting her head.  He has remote injury to the left ankle.  No swelling.  No numbness or tingling.  No discoloration, bruising or rash.  Patient has an ankle brace but does not use it on a regular basis.  No ankle swelling   HPI  Past Medical History:  Diagnosis Date  . Abnormal cervical Papanicolaou smear    cryo  . Anemia   . DUB (dysfunctional uterine bleeding)   . Environmental allergies   . Pregnancy induced hypertension   . Preterm labor     Patient Active Problem List   Diagnosis Date Noted  . Postoperative state 04/12/2014  . DUB (dysfunctional uterine bleeding) 03/08/2014  . Dysmenorrhea 03/08/2014    Past Surgical History:  Procedure Laterality Date  . BILATERAL SALPINGECTOMY Bilateral 04/12/2014   Procedure: BILATERAL SALPINGECTOMY;  Surgeon: Adam Phenix, MD;  Location: [facility];  Service: Gynecology;  Laterality: Bilateral;  . CRYOTHERAPY    . LAPAROSCOPIC TUBAL LIGATION Bilateral 11/10/2013   Procedure: bilateral LAPAROSCOPIC TUBAL LIGATION with filshie clips;  Surgeon: Freddrick March. Tenny Craw, MD;  Location: [facility];  Service: Gynecology;  Laterality: Bilateral;  . TUBAL LIGATION    . VAGINAL HYSTERECTOMY N/A 04/12/2014   Procedure: HYSTERECTOMY VAGINAL;  Surgeon: Adam Phenix, MD;  Location: [facility];  Service: Gynecology;  Laterality: N/A;    OB History    Gravida  3   Para  2   Term  2   Preterm  0   AB  1   Living  2     SAB  0   TAB  1   Ectopic  0    Multiple  0   Live Births  2            Home Medications    Prior to Admission medications   Medication Sig Start Date End Date Taking? Authorizing Provider  acetaminophen (TYLENOL) 500 MG tablet Take 2 tablets (1,000 mg total) by mouth every 8 (eight) hours as needed. 09/12/19   Darr, Gerilyn Pilgrim, PA-C  amLODipine (NORVASC) 5 MG tablet Take 1 tablet (5 mg total) by mouth daily. 05/06/19   Dartha Lodge, PA-C  hydrochlorothiazide (HYDRODIURIL) 25 MG tablet Take 1 tablet (25 mg total) by mouth daily. 05/06/19   Dartha Lodge, PA-C  predniSONE (DELTASONE) 20 MG tablet Take 1 tablet (20 mg total) by mouth daily for 5 days. 07/01/20 07/06/20  Merrilee Jansky, MD  omeprazole (PRILOSEC) 20 MG capsule Take 1 capsule (20 mg total) by mouth daily. Patient not taking: Reported on 02/09/2019 01/25/17 02/09/19  Muthersbaugh, Dahlia Client, PA-C    Family History Family History  Problem Relation Age of Onset  . Diabetes Maternal Grandmother   . Cancer Paternal Grandmother        started in lung  . Hypertension Paternal Grandmother   . Hypertension Mother   . Hypertension Father   .  Hyperlipidemia Father     Social History Social History   Tobacco Use  . Smoking status: Current Every Day Smoker    Packs/day: 0.50    Years: 0.00    Pack years: 0.00    Types: Cigarettes  . Smokeless tobacco: Never Used  . Tobacco comment: stopped with preg  Substance Use Topics  . Alcohol use: Yes    Comment: socially   . Drug use: No    Types: Marijuana    Comment: stopped with preg     Allergies   Hydrocodone, Tape, and Naprosyn [naproxen]   Review of Systems Review of Systems  Respiratory: Negative.   Cardiovascular: Negative.   Genitourinary: Negative.   Musculoskeletal: Positive for arthralgias and myalgias. Negative for joint swelling, neck pain and neck stiffness.  Skin: Negative.   Neurological: Negative for dizziness, facial asymmetry, light-headedness and headaches.   Psychiatric/Behavioral: Negative for confusion.     Physical Exam Triage Vital Signs ED Triage Vitals  Enc Vitals Group     BP 07/01/20 1704 (!) 156/96     Pulse Rate 07/01/20 1704 61     Resp 07/01/20 1704 17     Temp 07/01/20 1704 99.3 F (37.4 C)     Temp Source 07/01/20 1704 Oral     SpO2 07/01/20 1704 99 %     Weight --      Height --      Head Circumference --      Peak Flow --      Pain Score 07/01/20 1702 8     Pain Loc --      Pain Edu? --      Excl. in GC? --    No data found.  Updated Vital Signs BP (!) 156/96 (BP Location: Right Arm)   Pulse 61   Temp 99.3 F (37.4 C) (Oral)   Resp 17   LMP 03/09/2014   SpO2 99%   Visual Acuity Right Eye Distance:   Left Eye Distance:   Bilateral Distance:    Right Eye Near:   Left Eye Near:    Bilateral Near:     Physical Exam Vitals and nursing note reviewed.  Constitutional:      General: She is not in acute distress.    Appearance: She is not ill-appearing.  Cardiovascular:     Rate and Rhythm: Normal rate and regular rhythm.  Musculoskeletal:        General: Tenderness present. Normal range of motion.     Comments: Tenderness over the lateral aspect of the left ankle.  No swelling. Full range of motion   Neurological:     Mental Status: She is alert.      UC Treatments / Results  Labs (all labs ordered are listed, but only abnormal results are displayed) Labs Reviewed - No data to display  EKG   Radiology No results found.  Procedures Procedures (including critical care time)  Medications Ordered in UC Medications - No data to display  Initial Impression / Assessment and Plan / UC Course  I have reviewed the triage vital signs and the nursing notes.  Pertinent labs & imaging results that were available during my care of the patient were reviewed by me and considered in my medical decision making (see chart for details).     1.  Left ankle sprain: Patient is advised to use her  ankle brace for support Patient needs anti-inflammatory agents.  She cannot take nonsteroidal anti-inflammatory agents has will prescribe  prednisone. Gentle range of motion exercises Rest and ice your ankle after work or walking Return precautions given Final Clinical Impressions(s) / UC Diagnoses   Final diagnoses:  Sprain of left ankle, unspecified ligament, subsequent encounter     Discharge Instructions     You advised to wear your ankle support more often Follow-up with your foot doctor to further evaluate your ankle If symptoms worsen you will benefit from MRI of the ankle which can be arranged through your foot and ankle doctor. Rest, icing, elevation and range of motion exercises will help strengthen your ankle.   ED Prescriptions    Medication Sig Dispense Auth. Provider   predniSONE (DELTASONE) 20 MG tablet Take 1 tablet (20 mg total) by mouth daily for 5 days. 5 tablet Sima Lindenberger, Britta Mccreedy, MD     PDMP not reviewed this encounter.   Merrilee Jansky, MD 07/03/20 [id number]

## 2020-07-27 DIAGNOSIS — Z419 Encounter for procedure for purposes other than remedying health state, unspecified: Secondary | ICD-10-CM | POA: Diagnosis not present

## 2020-08-19 ENCOUNTER — Ambulatory Visit (HOSPITAL_COMMUNITY): Payer: Self-pay

## 2020-08-20 ENCOUNTER — Ambulatory Visit (INDEPENDENT_AMBULATORY_CARE_PROVIDER_SITE_OTHER): Payer: Medicaid Other

## 2020-08-20 ENCOUNTER — Ambulatory Visit (HOSPITAL_COMMUNITY)
Admission: EM | Admit: 2020-08-20 | Discharge: 2020-08-20 | Disposition: A | Discharge status: Home / Self Care | Payer: Medicaid Other | Attending: Emergency Medicine | Admitting: Emergency Medicine

## 2020-08-20 ENCOUNTER — Other Ambulatory Visit: Payer: Self-pay

## 2020-08-20 ENCOUNTER — Encounter (HOSPITAL_COMMUNITY): Payer: Self-pay

## 2020-08-20 DIAGNOSIS — M25572 Pain in left ankle and joints of left foot: Secondary | ICD-10-CM

## 2020-08-20 DIAGNOSIS — M25472 Effusion, left ankle: Secondary | ICD-10-CM | POA: Diagnosis not present

## 2020-08-20 DIAGNOSIS — M7989 Other specified soft tissue disorders: Secondary | ICD-10-CM | POA: Diagnosis not present

## 2020-08-20 MED ORDER — IBUPROFEN 800 MG PO TABS: 800.0000 mg | ORAL_TABLET | Freq: Three times a day (TID) | ORAL | 0 refills | Status: DC | PRN

## 2020-08-20 NOTE — ED Provider Notes (Signed)
[facility]    CSN: [id number] Arrival date & time: 08/20/20  1230      History   Chief Complaint Chief Complaint  Patient presents with  . Foot Pain    HPI Brenda Blankenship is a 35 y.o. female.   Patient presents with 2 to 3-day history of pain and swelling in her left ankle.  She denies known injury but states that she falls frequently.  She denies fever, wounds, bruising, redness, numbness, weakness, paresthesias, or other symptoms.  Treatment attempted at home with ibuprofen and soaking her foot.  Patient was seen here for similar symptoms on 07/01/2020; diagnosed with left ankle sprain; treated with prednisone, ankle brace, rest, ice; She states her symptoms improved with this treatment.  Her medical history includes pregnancy-induced hypertension, anemia, environmental allergies.  The history is provided by the patient and medical records.    Past Medical History:  Diagnosis Date  . Abnormal cervical Papanicolaou smear    cryo  . Anemia   . DUB (dysfunctional uterine bleeding)   . Environmental allergies   . Pregnancy induced hypertension   . Preterm labor     Patient Active Problem List   Diagnosis Date Noted  . Postoperative state 04/12/2014  . DUB (dysfunctional uterine bleeding) 03/08/2014  . Dysmenorrhea 03/08/2014    Past Surgical History:  Procedure Laterality Date  . BILATERAL SALPINGECTOMY Bilateral 04/12/2014   Procedure: BILATERAL SALPINGECTOMY;  Surgeon: Adam Phenix, MD;  Location: [facility];  Service: Gynecology;  Laterality: Bilateral;  . CRYOTHERAPY    . LAPAROSCOPIC TUBAL LIGATION Bilateral 11/10/2013   Procedure: bilateral LAPAROSCOPIC TUBAL LIGATION with filshie clips;  Surgeon: Freddrick March. Tenny Craw, MD;  Location: [facility];  Service: Gynecology;  Laterality: Bilateral;  . TUBAL LIGATION    . VAGINAL HYSTERECTOMY N/A 04/12/2014   Procedure: HYSTERECTOMY VAGINAL;  Surgeon: Adam Phenix, MD;  Location: [facility];  Service: Gynecology;  Laterality:  N/A;    OB History    Gravida  3   Para  2   Term  2   Preterm  0   AB  1   Living  2     SAB  0   IAB  1   Ectopic  0   Multiple  0   Live Births  2            Home Medications    Prior to Admission medications   Medication Sig Start Date End Date Taking? Authorizing Provider  ibuprofen (ADVIL) 800 MG tablet Take 1 tablet (800 mg total) by mouth every 8 (eight) hours as needed. 08/20/20  Yes Mickie Bail, NP  acetaminophen (TYLENOL) 500 MG tablet Take 2 tablets (1,000 mg total) by mouth every 8 (eight) hours as needed. 09/12/19   Darr, Gerilyn Pilgrim, PA-C  amLODipine (NORVASC) 5 MG tablet Take 1 tablet (5 mg total) by mouth daily. 05/06/19   Dartha Lodge, PA-C  hydrochlorothiazide (HYDRODIURIL) 25 MG tablet Take 1 tablet (25 mg total) by mouth daily. 05/06/19   Dartha Lodge, PA-C  omeprazole (PRILOSEC) 20 MG capsule Take 1 capsule (20 mg total) by mouth daily. Patient not taking: Reported on 02/09/2019 01/25/17 02/09/19  Muthersbaugh, Dahlia Client, PA-C    Family History Family History  Problem Relation Age of Onset  . Diabetes Maternal Grandmother   . Cancer Paternal Grandmother        started in lung  . Hypertension Paternal Grandmother   . Hypertension Mother   . Hypertension  Father   . Hyperlipidemia Father     Social History Social History   Tobacco Use  . Smoking status: Current Every Day Smoker    Packs/day: 0.50    Years: 0.00    Pack years: 0.00    Types: Cigarettes  . Smokeless tobacco: Never Used  . Tobacco comment: stopped with preg  Substance Use Topics  . Alcohol use: Yes    Comment: socially   . Drug use: No    Types: Marijuana    Comment: stopped with preg     Allergies   Hydrocodone, Tape, and Naprosyn [naproxen]   Review of Systems Review of Systems  Constitutional: Negative for chills and fever.  HENT: Negative for ear pain and sore throat.   Eyes: Negative for pain and visual disturbance.  Respiratory: Negative for cough and  shortness of breath.   Cardiovascular: Negative for chest pain and palpitations.  Gastrointestinal: Negative for abdominal pain and vomiting.  Genitourinary: Negative for dysuria and hematuria.  Musculoskeletal: Positive for arthralgias, gait problem and joint swelling. Negative for back pain.  Skin: Negative for color change and rash.  Neurological: Negative for syncope, weakness and numbness.  All other systems reviewed and are negative.    Physical Exam Triage Vital Signs ED Triage Vitals [08/20/20 1326]  Enc Vitals Group     BP      Pulse      Resp      Temp      Temp src      SpO2      Weight      Height      Head Circumference      Peak Flow      Pain Score 10     Pain Loc      Pain Edu?      Excl. in GC?    No data found.  Updated Vital Signs BP (!) 138/94 (BP Location: Right Arm)   Pulse 77   Temp 99.1 F (37.3 C) (Oral)   Resp 17   LMP 03/09/2014   SpO2 100%   Visual Acuity Right Eye Distance:   Left Eye Distance:   Bilateral Distance:    Right Eye Near:   Left Eye Near:    Bilateral Near:     Physical Exam Vitals and nursing note reviewed.  Constitutional:      General: She is not in acute distress.    Appearance: She is well-developed and well-nourished.  HENT:     Head: Normocephalic and atraumatic.     Mouth/Throat:     Mouth: Mucous membranes are moist.  Eyes:     Conjunctiva/sclera: Conjunctivae normal.  Cardiovascular:     Rate and Rhythm: Normal rate and regular rhythm.     Heart sounds: Normal heart sounds.  Pulmonary:     Effort: Pulmonary effort is normal. No respiratory distress.     Breath sounds: Normal breath sounds.  Abdominal:     Palpations: Abdomen is soft.     Tenderness: There is no abdominal tenderness.  Musculoskeletal:        General: Swelling and tenderness present. No deformity or edema.     Cervical back: Neck supple.     Comments: Calf nontender. Left ankle tender and edematous. Decreased ROM due to pain.   No wounds, ecchymosis, erythema. Sensation intact.   Skin:    General: Skin is warm and dry.     Capillary Refill: Capillary refill takes less than 2 seconds.  Findings: No bruising, erythema, lesion or rash.  Neurological:     General: No focal deficit present.     Mental Status: She is alert and oriented to person, place, and time.     Sensory: No sensory deficit.     Motor: No weakness.     Gait: Gait abnormal.  Psychiatric:        Mood and Affect: Mood and affect and mood normal.        Behavior: Behavior normal.      UC Treatments / Results  Labs (all labs ordered are listed, but only abnormal results are displayed) Labs Reviewed - No data to display  EKG   Radiology DG Ankle Complete Left  Result Date: 08/20/2020 CLINICAL DATA:  Pain and swelling. EXAM: LEFT ANKLE COMPLETE - 3+ VIEW COMPARISON:  No recent. FINDINGS: Diffuse soft tissue swelling. No acute bony or joint abnormality. No evidence of fracture dislocation. IMPRESSION: Diffuse soft tissue swelling. No acute bony abnormality. Electronically Signed   By: Maisie Fus  Register   On: 08/20/2020 14:01    Procedures Procedures (including critical care time)  Medications Ordered in UC Medications - No data to display  Initial Impression / Assessment and Plan / UC Course  I have reviewed the triage vital signs and the nursing notes.  Pertinent labs & imaging results that were available during my care of the patient were reviewed by me and considered in my medical decision making (see chart for details).   Left ankle pain and edema.  X-ray shows soft tissue swelling but no bony abnormality.  Treating with ibuprofen, rest, elevation, ice packs.  Instructed patient to follow-up with orthopedics.  She agrees to plan of care.   Final Clinical Impressions(s) / UC Diagnoses   Final diagnoses:  Acute left ankle pain  Edema of left ankle     Discharge Instructions     Take the ibuprofen as prescribed.  Rest and  elevate your ankle.  Apply ice packs 2-3 times a day for up to 20 minutes each.    Follow up with an orthopedist if your symptoms are not improving.       ED Prescriptions    Medication Sig Dispense Auth. Provider   ibuprofen (ADVIL) 800 MG tablet Take 1 tablet (800 mg total) by mouth every 8 (eight) hours as needed. 21 tablet Mickie Bail, NP     I have reviewed the PDMP during this encounter.   Mickie Bail, NP 08/20/20 1420

## 2020-08-20 NOTE — ED Triage Notes (Signed)
Pt presents with left foot pain that started this morning. Pt states her foot is swollen and ibuprofen has not relieved the swelling. Pt states she has tried to soak her foot and it has not helped. Pt states moving her toes around relieves the pain.

## 2020-08-20 NOTE — Discharge Instructions (Addendum)
Take the ibuprofen as prescribed.  Rest and elevate your ankle.  Apply ice packs 2-3 times a day for up to 20 minutes each.    Follow up with an orthopedist if your symptoms are not improving.

## 2020-08-26 DIAGNOSIS — M25572 Pain in left ankle and joints of left foot: Secondary | ICD-10-CM | POA: Diagnosis not present

## 2020-08-26 DIAGNOSIS — M25571 Pain in right ankle and joints of right foot: Secondary | ICD-10-CM | POA: Diagnosis not present

## 2020-08-27 DIAGNOSIS — Z419 Encounter for procedure for purposes other than remedying health state, unspecified: Secondary | ICD-10-CM | POA: Diagnosis not present

## 2020-09-09 ENCOUNTER — Telehealth: Payer: Self-pay

## 2020-09-09 NOTE — Telephone Encounter (Signed)
Pt scheduled appt on our online portal but she has Medicaid and we are not accepting new pt's with medicaid.  Left pt message and sent my chart message

## 2020-09-24 ENCOUNTER — Ambulatory Visit: Payer: Medicaid Other | Admitting: Medical

## 2020-09-24 DIAGNOSIS — Z419 Encounter for procedure for purposes other than remedying health state, unspecified: Secondary | ICD-10-CM | POA: Diagnosis not present

## 2020-10-07 DIAGNOSIS — M25572 Pain in left ankle and joints of left foot: Secondary | ICD-10-CM | POA: Diagnosis not present

## 2020-10-07 NOTE — Progress Notes (Signed)
Patient did not show for appointment.   

## 2020-10-08 ENCOUNTER — Encounter: Payer: Medicaid Other | Admitting: Family

## 2020-10-08 DIAGNOSIS — Z7689 Persons encountering health services in other specified circumstances: Secondary | ICD-10-CM

## 2020-10-25 DIAGNOSIS — Z419 Encounter for procedure for purposes other than remedying health state, unspecified: Secondary | ICD-10-CM | POA: Diagnosis not present

## 2020-11-04 ENCOUNTER — Ambulatory Visit: Payer: Medicaid Other | Attending: Orthopedic Surgery

## 2020-11-04 ENCOUNTER — Other Ambulatory Visit: Payer: Self-pay

## 2020-11-04 DIAGNOSIS — R269 Unspecified abnormalities of gait and mobility: Secondary | ICD-10-CM | POA: Diagnosis not present

## 2020-11-04 DIAGNOSIS — M6281 Muscle weakness (generalized): Secondary | ICD-10-CM | POA: Insufficient documentation

## 2020-11-04 DIAGNOSIS — Z7409 Other reduced mobility: Secondary | ICD-10-CM | POA: Insufficient documentation

## 2020-11-04 NOTE — Therapy (Signed)
[facility] [address] [place], [place], [address] Phone: [phone]   Fax:  [phone]  Physical Therapy Evaluation  Patient Details  Name: Brenda Blankenship MRN: [id number] Date of Birth: 10-17-1985 Referring Provider (PT): Frederico Hamman, MD   Encounter Date: 11/04/2020   PT End of Session - 11/04/20 1137    Visit Number 1    Date for PT Re-Evaluation 12/23/20    Authorization Type [employer]    PT Start Time 1137    PT Stop Time 1215    PT Time Calculation (min) 38 min    Activity Tolerance Patient tolerated treatment well;Patient limited by pain    Behavior During Therapy [facility] for tasks assessed/performed           Past Medical History:  Diagnosis Date  . Abnormal cervical Papanicolaou smear    cryo  . Anemia   . DUB (dysfunctional uterine bleeding)   . Environmental allergies   . Pregnancy induced hypertension   . Preterm labor     Past Surgical History:  Procedure Laterality Date  . BILATERAL SALPINGECTOMY Bilateral 04/12/2014   Procedure: BILATERAL SALPINGECTOMY;  Surgeon: Adam Phenix, MD;  Location: [facility];  Service: Gynecology;  Laterality: Bilateral;  . CRYOTHERAPY    . LAPAROSCOPIC TUBAL LIGATION Bilateral 11/10/2013   Procedure: bilateral LAPAROSCOPIC TUBAL LIGATION with filshie clips;  Surgeon: Freddrick March. Tenny Craw, MD;  Location: [facility];  Service: Gynecology;  Laterality: Bilateral;  . TUBAL LIGATION    . VAGINAL HYSTERECTOMY N/A 04/12/2014   Procedure: HYSTERECTOMY VAGINAL;  Surgeon: Adam Phenix, MD;  Location: [facility];  Service: Gynecology;  Laterality: N/A;    There were no vitals filed for this visit.    Subjective Assessment - 11/04/20 1140    Subjective Pt is a pleasant 35 y/o F who presents to PT with reports of bilateral ankle pain, L>R, with reports of swelling in bilateral ankles. Pt has had multiple ankle sprains in each LE and notes this has been a chronic issue, but pain has recently  increased over the past few months. She has been ambulating in a fx boot on L LE after visit with orthopedics a few weeks ago. Continues to note increased pain with activity and was using an axillary crutch to ambulate until recently. No recent MOI, just increasing pain with walking, standing, and prolonged sitting in dependent position. Denies parethesias in either distal LE.    How long can you sit comfortably? 25 min    How long can you stand comfortably? 15 min    How long can you walk comfortably? 4 min    Patient Stated Goals pt would like to decrease bilateral ankle pain in order to get back to recreational activites such as walking, jumping, etc.    Currently in Pain? Yes    Pain Score 3     Pain Location Ankle    Pain Orientation Left    Pain Descriptors / Indicators Sharp;Throbbing    Aggravating Factors  walking, standing, prolonged standing - 9/10 at worst    Pain Relieving Factors ice, elevation, medications    Multiple Pain Sites Yes    Pain Score 4    Pain Location Ankle    Pain Orientation Right    Pain Descriptors / Indicators Sharp;Throbbing    Pain Type Chronic pain    Aggravating Factors  walking, prolonged sitting    Pain Relieving Factors rest, ice, medication  [facility] PT Assessment - 11/04/20 0001      Assessment   Medical Diagnosis peroneal tendonitis L ankle    Referring Provider (PT) Frederico Hamman, MD      Precautions   Precautions --   fracture boot on L LE     Restrictions   Weight Bearing Restrictions No      Balance Screen   Has the patient fallen in the past 6 months Yes    How many times? 2 - fell walking in the yard and playing with her children    Has the patient had a decrease in activity level because of a fear of falling?  No    Is the patient reluctant to leave their home because of a fear of falling?  No      Home [employer] information [employer] manager residence    Living Arrangements Spouse/significant  other;Children;Parent    Type of Home House    Home Access Level entry    Home Layout Two level    Alternate Level Stairs-Number of Steps 15    Alternate Level Stairs-Rails Left   ascending   Home Equipment Crutches      Prior Function   Level of Independence Independent      AROM   Overall AROM Comments Bilateral ankle AROM WFL with exception of L ankle eversion      Strength   Right Ankle Dorsiflexion 3+/5    Right Ankle Plantar Flexion 3+/5    Right Ankle Inversion 3/5    Right Ankle Eversion 3/5    Left Ankle Dorsiflexion 3-/5    Left Ankle Plantar Flexion 3-/5    Left Ankle Inversion 2+/5    Left Ankle Eversion 2+/5      Palpation   Palpation comment --   TTP to L fibular head, L peroneal muscle bellies, distal peroneal tedons near insetion; snapping/popping of R peroneal tendons with R ankle eversion     Balance   Balance Assessed Yes      Static Standing Balance   Static Standing - Balance Support No upper extremity supported      Standardized Balance Assessment   Standardized Balance Assessment Five Times Sit to Stand    Five times sit to stand comments  38 sec, increased pain      High Level Balance   High Level Balance Comments Right SLS: 27 sec; Left SLS: Unable                      Objective measurements completed on examination: See above findings.       [facility] Adult PT Treatment/Exercise - 11/04/20 0001      Ankle Exercises: Supine   T-Band L ankle DF/PF/Inv/Ev x 10 - Yellow Tband                    PT Short Term Goals - 11/04/20 1337      PT SHORT TERM GOAL #1   Title Pt will be compliant with 90% if HEP in order to improve carryover    Baseline Initial HEP given    Time 3    Period Weeks    Status New    Target Date 11/25/20      PT SHORT TERM GOAL #2   Title Pt will decrease 5xSTS to no greater than 20 seconds in order to improve comfort and functional mobility    Baseline 38 seconds with increased pain    Time 3  Period Weeks    Status New    Target Date 11/25/20             PT Long Term Goals - 11/04/20 1341      PT LONG TERM GOAL #1   Title Pt will self report bilateral ankle pain no greater than 3/10 at worst in order improve comfort and functional ability    Baseline 9/10 pain at worst    Time 6    Period Weeks    Status New    Target Date 12/16/20      PT LONG TERM GOAL #2   Title Pt will be able to hold SLS on L LE for at least 25 seconds in order to improve stability and balance    Baseline unable    Time 6    Period Weeks    Status New    Target Date 12/16/20      PT LONG TERM GOAL #3   Title Pt will decrease future LEFS score by MDC in order to improve comfort and functional ability    Baseline LEFS at next session    Time 6    Period Weeks    Status New    Target Date 12/16/20      PT LONG TERM GOAL #4   Title Pt will increase bilateral ankle strength in tested motions to no less than 4/5 in order to improve stability    Baseline See flowsheet    Time 6    Period Weeks    Status New    Target Date 12/16/20                  Plan - 11/04/20 1216    Clinical Impression Statement Pt is a pleasant 35 y/o F who presents to PT with reports of bilateral ankle pain, L>R, with reports of swelling in bilateral ankles. Physcial findings are consistent with MD impression, as pt has bilateral ankle instability and tenderness to L peroneal muscle/tendons. Her 5xSTS time demonstrates sharp decrease in functional mobility and increased fall risk. While she was unable to hold SLS on L LE, she was able to complete 27 seconds before UE support was required on R, but demonstrated sharp increase in ankle instability and large amounts of sway. Pt would benefit from skilled PT services in order to improve balance and stability of bilateral ankles wihle decreasing pain and discomfort.    Personal Factors and Comorbidities Comorbidity 1;Time since onset of  injury/illness/exacerbation    Comorbidities HTN    Examination-Activity Limitations Squat;Stand    Examination-Participation Restrictions Shop;Yard Work;Community Activity    Stability/Clinical Decision Making Stable/Uncomplicated    Clinical Decision Making Low    Rehab Potential Good    PT Frequency 2x / week    PT Duration 6 weeks    PT Treatment/Interventions ADLs/Self Care Home Management;Electrical Stimulation;Moist Heat;Ultrasound;Gait training;Stair training;Functional mobility training;Therapeutic activities;Therapeutic exercise;Balance training;Neuromuscular re-education;Manual techniques;Dry needling;Joint Manipulations    PT Next Visit Plan use subjective outcomes measure such as LEFS, assess response to HEP exercises, progress proprioceptive exercises as able    PT Home Exercise Plan [email or web address]    Consulted and Agree with Plan of Care Patient           Patient will benefit from skilled therapeutic intervention in order to improve the following deficits and impairments:  Abnormal gait,Decreased activity tolerance,Decreased balance,Decreased endurance,Decreased range of motion,Decreased strength,Pain  Visit Diagnosis: Muscle weakness (generalized)  Impaired gait  Impaired functional mobility, balance, and endurance  Problem List Patient Active Problem List   Diagnosis Date Noted  . Postoperative state 04/12/2014  . DUB (dysfunctional uterine bleeding) 03/08/2014  . Dysmenorrhea 03/08/2014    Eloy End, PT, DPT 11/04/20 1:49 PM  [facility] [address] [place], [place], [address] Phone: [phone]   Fax:  [phone]  Name: MISSIE GEHRIG MRN: [id number] Date of Birth: 1986/02/05    [facility] Authorization   Choose one: Rehabilitative  Standardized Assessment or Functional Outcome Tool: See Pain Assessment and 5x sit to stand  Score or Percent Disability: Severe Disability  Body Parts  Treated (Select each separately):  1. Ankle. Overall deficits/functional limitations for body part selected: severe

## 2020-11-11 ENCOUNTER — Ambulatory Visit: Payer: Medicaid Other

## 2020-11-14 ENCOUNTER — Ambulatory Visit: Payer: Medicaid Other

## 2020-11-14 ENCOUNTER — Other Ambulatory Visit: Payer: Self-pay

## 2020-11-14 DIAGNOSIS — M6281 Muscle weakness (generalized): Secondary | ICD-10-CM | POA: Diagnosis not present

## 2020-11-14 DIAGNOSIS — R269 Unspecified abnormalities of gait and mobility: Secondary | ICD-10-CM

## 2020-11-14 DIAGNOSIS — Z7409 Other reduced mobility: Secondary | ICD-10-CM | POA: Diagnosis not present

## 2020-11-14 NOTE — Therapy (Signed)
[facility] Outpatient Rehabilitation [facility] [address] [place], [place], [address] Phone: [phone]   Fax:  [phone]  Physical Therapy Treatment  Patient Details  Name: Brenda Blankenship MRN: [id number] Date of Birth: [birth year] Referring Provider (PT): Brenda Hamman, MD   Encounter Date: 11/14/2020   PT End of Session - 11/14/20 1533    Visit Number 2    Number of Visits 13    Date for PT Re-Evaluation 12/23/20    Authorization Type [employer]    Authorization Time Period 11/11/2020-01/10/2021    Authorization - Visit Number 1    Authorization - Number of Visits 12    PT Start Time 1530    PT Stop Time 1608    PT Time Calculation (min) 38 min    Activity Tolerance Patient tolerated treatment well;Patient limited by pain    Behavior During Therapy [facility] for tasks assessed/performed           Past Medical History:  Diagnosis Date  . Abnormal cervical Papanicolaou smear    cryo  . Anemia   . DUB (dysfunctional uterine bleeding)   . Environmental allergies   . Pregnancy induced hypertension   . Preterm labor     Past Surgical History:  Procedure Laterality Date  . BILATERAL SALPINGECTOMY Bilateral 04/12/2014   Procedure: BILATERAL SALPINGECTOMY;  Surgeon: Brenda Phenix, MD;  Location: [facility];  Service: Gynecology;  Laterality: Bilateral;  . CRYOTHERAPY    . LAPAROSCOPIC TUBAL LIGATION Bilateral 11/10/2013   Procedure: bilateral LAPAROSCOPIC TUBAL LIGATION with filshie clips;  Surgeon: Brenda March. Tenny Craw, MD;  Location: [facility];  Service: Gynecology;  Laterality: Bilateral;  . TUBAL LIGATION    . VAGINAL HYSTERECTOMY N/A 04/12/2014   Procedure: HYSTERECTOMY VAGINAL;  Surgeon: Brenda Phenix, MD;  Location: [facility];  Service: Gynecology;  Laterality: N/A;    There were no vitals filed for this visit.   Subjective Assessment - 11/14/20 1534    Subjective Pt presents to PT with reports of continued bilateral ankle pain, L>R. She slipped and fell on  Monday of this week and had to cancel her appointment. Pt has otherwise been compliant with her HEP with no adverse effect. She is ready to begin PT treatment at this time.    Currently in Pain? Yes    Pain Score 8     Pain Location Ankle    Pain Orientation Left    Pain Descriptors / Indicators Sharp    Pain Score 4    Pain Location Ankle    Pain Orientation Right                             [facility] Adult PT Treatment/Exercise - 11/14/20 0001      Manual Therapy   Manual therapy comments STM to L fibularis longus/brevis      Ankle Exercises: Stretches   Other Stretch calf stretch with inversion bias 2x20 sec      Ankle Exercises: Aerobic   Nustep lvl 5 LE only x 4 min while taking subjective      Ankle Exercises: Seated   Heel Raises 20 reps;Both    Other Seated Ankle Exercises DF//PF w/ rocker board x 15      Ankle Exercises: Supine   T-Band L ankle DF/PF/Inv/Ev x 10 - Red Tband                    PT Short Term  Goals - 11/04/20 1337      PT SHORT TERM GOAL #1   Title Pt will be compliant with 90% if HEP in order to improve carryover    Baseline Initial HEP given    Time 3    Period Weeks    Status New    Target Date 11/25/20      PT SHORT TERM GOAL #2   Title Pt will decrease 5xSTS to no greater than 20 seconds in order to improve comfort and functional mobility    Baseline 38 seconds with increased pain    Time 3    Period Weeks    Status New    Target Date 11/25/20             PT Long Term Goals - 11/04/20 1341      PT LONG TERM GOAL #1   Title Pt will self report bilateral ankle pain no greater than 3/10 at worst in order improve comfort and functional ability    Baseline 9/10 pain at worst    Time 6    Period Weeks    Status New    Target Date 12/16/20      PT LONG TERM GOAL #2   Title Pt will be able to hold SLS on L LE for at least 25 seconds in order to improve stability and balance    Baseline unable    Time 6     Period Weeks    Status New    Target Date 12/16/20      PT LONG TERM GOAL #3   Title Pt will decrease future LEFS score by MDC in order to improve comfort and functional ability    Baseline LEFS at next session    Time 6    Period Weeks    Status New    Target Date 12/16/20      PT LONG TERM GOAL #4   Title Pt will increase bilateral ankle strength in tested motions to no less than 4/5 in order to improve stability    Baseline See flowsheet    Time 6    Period Weeks    Status New    Target Date 12/16/20                 Plan - 11/14/20 1609    Clinical Impression Statement Pt was able to complete prescribed exercises well with no adverse effect noted. She responded well to manual therapy interventions, noting decrease in L ankle symptoms to 7/10 post session. She continues to benefit from skilled PT services and should continue be seen per POC as prescribed.    PT Treatment/Interventions ADLs/Self Care Home Management;Electrical Stimulation;Moist Heat;Ultrasound;Gait training;Stair training;Functional mobility training;Therapeutic activities;Therapeutic exercise;Balance training;Neuromuscular re-education;Manual techniques;Dry needling;Joint Manipulations    PT Next Visit Plan use subjective outcomes measure such as LEFS, assess response to HEP exercises, progress proprioceptive exercises as able    PT Home Exercise Plan [id number]           Patient will benefit from skilled therapeutic intervention in order to improve the following deficits and impairments:  Abnormal gait,Decreased activity tolerance,Decreased balance,Decreased endurance,Decreased range of motion,Decreased strength,Pain  Visit Diagnosis: Muscle weakness (generalized)  Impaired gait  Impaired functional mobility, balance, and endurance     Problem List Patient Active Problem List   Diagnosis Date Noted  . Postoperative state 04/12/2014  . DUB (dysfunctional uterine bleeding) 03/08/2014  .  Dysmenorrhea 03/08/2014    Eloy End, PT, DPT 11/14/20  4:12 PM  [facility] [address] [place], [place], [address] Phone: [phone]   Fax:  [phone]  Name: Brenda Blankenship MRN: [id number] Date of Birth: 12/29/1985

## 2020-11-24 DIAGNOSIS — Z419 Encounter for procedure for purposes other than remedying health state, unspecified: Secondary | ICD-10-CM | POA: Diagnosis not present

## 2020-11-25 ENCOUNTER — Other Ambulatory Visit: Payer: Self-pay

## 2020-11-25 ENCOUNTER — Ambulatory Visit: Payer: Medicaid Other | Attending: Orthopedic Surgery

## 2020-11-25 DIAGNOSIS — R269 Unspecified abnormalities of gait and mobility: Secondary | ICD-10-CM | POA: Diagnosis not present

## 2020-11-25 DIAGNOSIS — M6281 Muscle weakness (generalized): Secondary | ICD-10-CM | POA: Insufficient documentation

## 2020-11-25 DIAGNOSIS — Z7409 Other reduced mobility: Secondary | ICD-10-CM | POA: Insufficient documentation

## 2020-11-25 NOTE — Therapy (Signed)
[facility] Outpatient Rehabilitation [facility] [address] [place], [place], [address] Phone: [phone]   Fax:  [phone]  Physical Therapy Treatment  Patient Details  Name: Brenda Blankenship MRN: [id number] Date of Birth: August 05, 1985 Referring Provider (PT): Frederico Hamman, MD   Encounter Date: 11/25/2020   PT End of Session - 11/25/20 0936    Visit Number 3    Number of Visits 13    Date for PT Re-Evaluation 12/23/20    Authorization Type [employer]    Authorization Time Period 11/11/2020-01/10/2021    Authorization - Visit Number 2    Authorization - Number of Visits 12    PT Start Time 0933    PT Stop Time 1015    PT Time Calculation (min) 42 min    Activity Tolerance Patient tolerated treatment well;Patient limited by pain    Behavior During Therapy [facility] for tasks assessed/performed           Past Medical History:  Diagnosis Date  . Abnormal cervical Papanicolaou smear    cryo  . Anemia   . DUB (dysfunctional uterine bleeding)   . Environmental allergies   . Pregnancy induced hypertension   . Preterm labor     Past Surgical History:  Procedure Laterality Date  . BILATERAL SALPINGECTOMY Bilateral 04/12/2014   Procedure: BILATERAL SALPINGECTOMY;  Surgeon: Adam Phenix, MD;  Location: [facility];  Service: Gynecology;  Laterality: Bilateral;  . CRYOTHERAPY    . LAPAROSCOPIC TUBAL LIGATION Bilateral 11/10/2013   Procedure: bilateral LAPAROSCOPIC TUBAL LIGATION with filshie clips;  Surgeon: Freddrick March. Tenny Craw, MD;  Location: [facility];  Service: Gynecology;  Laterality: Bilateral;  . TUBAL LIGATION    . VAGINAL HYSTERECTOMY N/A 04/12/2014   Procedure: HYSTERECTOMY VAGINAL;  Surgeon: Adam Phenix, MD;  Location: [facility];  Service: Gynecology;  Laterality: N/A;    There were no vitals filed for this visit.   Subjective Assessment - 11/25/20 0937    Subjective She reports being on her feet a lot which has caused more swelling and pain in the ankles. She  reports the ice makes her ankles more stiff.    Currently in Pain? Yes    Pain Score 6    5-6 in Lt ankle, 7-8 in Rt ankle   Pain Location Ankle    Pain Orientation Right;Left    Pain Descriptors / Indicators Sharp;Aching;Throbbing    Pain Type Chronic pain    Pain Onset More than a month ago    Pain Frequency Constant              [facility] PT Assessment - 11/25/20 0001      Observation/Other Assessments   Other Surveys  Lower Extremity Functional Scale    Lower Extremity Functional Scale  24      Strength   Overall Strength Comments gross hip strength 4-/5 bilaterally      Standardized Balance Assessment   Standardized Balance Assessment Five Times Sit to Stand    Five times sit to stand comments  29.3 (soreness in hips)                         [facility] Adult PT Treatment/Exercise - 11/25/20 0001      Self-Care   Self-Care Other Self-Care Comments    Other Self-Care Comments  see patient education      Knee/Hip Exercises: Supine   Bridges 10 reps    Bridges Limitations x 2  Straight Leg Raises 10 reps    Straight Leg Raises Limitations bilateral      Knee/Hip Exercises: Sidelying   Hip ABduction 10 reps    Hip ABduction Limitations x 2      Manual Therapy   Manual Therapy Taping    Kinesiotex Edema      Kinesiotix   Edema fan shape K-tape to bilateral lateral ankles to assist in swelling reduction                  PT Education - 11/25/20 1007    Education Details Updated HEP. LEFS outcome. Proper removal of K-tape and to remove if any skin irritation occurs.    Person(s) Educated Patient    Methods Explanation;Verbal cues;Handout;Demonstration    Comprehension Verbalized understanding;Returned demonstration;Verbal cues required            PT Short Term Goals - 11/25/20 1019      PT SHORT TERM GOAL #1   Title Pt will be compliant with 90% if HEP in order to improve carryover    Baseline Initial HEP given    Time 3    Period  Weeks    Status On-going    Target Date 11/25/20      PT SHORT TERM GOAL #2   Title Pt will decrease 5xSTS to no greater than 20 seconds in order to improve comfort and functional mobility    Baseline see flowsheet    Time 3    Period Weeks    Status On-going    Target Date 11/25/20             PT Long Term Goals - 11/04/20 1341      PT LONG TERM GOAL #1   Title Pt will self report bilateral ankle pain no greater than 3/10 at worst in order improve comfort and functional ability    Baseline 9/10 pain at worst    Time 6    Period Weeks    Status New    Target Date 12/16/20      PT LONG TERM GOAL #2   Title Pt will be able to hold SLS on L LE for at least 25 seconds in order to improve stability and balance    Baseline unable    Time 6    Period Weeks    Status New    Target Date 12/16/20      PT LONG TERM GOAL #3   Title Pt will decrease future LEFS score by MDC in order to improve comfort and functional ability    Baseline LEFS at next session    Time 6    Period Weeks    Status New    Target Date 12/16/20      PT LONG TERM GOAL #4   Title Pt will increase bilateral ankle strength in tested motions to no less than 4/5 in order to improve stability    Baseline See flowsheet    Time 6    Period Weeks    Status New    Target Date 12/16/20                 Plan - 11/25/20 1002    Clinical Impression Statement LEFS captured at today's session with patient scoring 24/80. Focused on hip strengthening to improve proximal stability as patient is noted to have gross weakness in bilateral hips upon assessment today. She quickly fatigued with hip strengthening, though denied increased pain. Her 5xSTS has improved compared to  baseline, though she still scores at high fall risk based upon her score. K-tape was utilized to assist in swelling reduction as she is noted to have non-pitting edema about bilateral lateral ankles.    PT Treatment/Interventions ADLs/Self Care  Home Management;Electrical Stimulation;Moist Heat;Ultrasound;Gait training;Stair training;Functional mobility training;Therapeutic activities;Therapeutic exercise;Balance training;Neuromuscular re-education;Manual techniques;Dry needling;Joint Manipulations    PT Next Visit Plan review/update HEP.  progress proprioceptive exercises as able    PT Home Exercise Plan [id number]    Consulted and Agree with Plan of Care Patient           Patient will benefit from skilled therapeutic intervention in order to improve the following deficits and impairments:  Abnormal gait,Decreased activity tolerance,Decreased balance,Decreased endurance,Decreased range of motion,Decreased strength,Pain  Visit Diagnosis: Muscle weakness (generalized)  Impaired gait  Impaired functional mobility, balance, and endurance     Problem List Patient Active Problem List   Diagnosis Date Noted  . Postoperative state 04/12/2014  . DUB (dysfunctional uterine bleeding) 03/08/2014  . Dysmenorrhea 03/08/2014   Letitia Libra, PT, DPT, ATC 11/25/20 10:32 AM  [facility] Outpatient Rehabilitation [facility] [address] [place], [place], [address] Phone: [phone]   Fax:  [phone]  Name: ANNALYSSA THUNE MRN: [id number] Date of Birth: 11-Jun-1986

## 2020-11-26 DIAGNOSIS — M25551 Pain in right hip: Secondary | ICD-10-CM | POA: Diagnosis not present

## 2020-11-26 DIAGNOSIS — M25552 Pain in left hip: Secondary | ICD-10-CM | POA: Diagnosis not present

## 2020-11-26 DIAGNOSIS — M25572 Pain in left ankle and joints of left foot: Secondary | ICD-10-CM | POA: Diagnosis not present

## 2020-11-28 ENCOUNTER — Other Ambulatory Visit: Payer: Self-pay

## 2020-11-28 ENCOUNTER — Ambulatory Visit: Payer: Medicaid Other

## 2020-11-28 DIAGNOSIS — M6281 Muscle weakness (generalized): Secondary | ICD-10-CM | POA: Diagnosis not present

## 2020-11-28 DIAGNOSIS — Z7409 Other reduced mobility: Secondary | ICD-10-CM | POA: Diagnosis not present

## 2020-11-28 DIAGNOSIS — R269 Unspecified abnormalities of gait and mobility: Secondary | ICD-10-CM | POA: Diagnosis not present

## 2020-11-28 NOTE — Therapy (Signed)
[facility] Outpatient Rehabilitation [facility] [address] [place], [place], [address] Phone: [phone]   Fax:  [phone]  Physical Therapy Treatment  Patient Details  Name: Brenda Blankenship MRN: [id number] Date of Birth: 05-12-1986 Referring Provider (PT): Frederico Hamman, MD   Encounter Date: 11/28/2020   PT End of Session - 11/28/20 0934    Visit Number 4    Number of Visits 13    Date for PT Re-Evaluation 12/23/20    Authorization Type [employer]    Authorization Time Period 11/11/2020-01/10/2021    Authorization - Visit Number 3    Authorization - Number of Visits 12    PT Start Time 0934    PT Stop Time 1014    PT Time Calculation (min) 40 min    Activity Tolerance Patient tolerated treatment well;Patient limited by pain    Behavior During Therapy [facility] for tasks assessed/performed           Past Medical History:  Diagnosis Date  . Abnormal cervical Papanicolaou smear    cryo  . Anemia   . DUB (dysfunctional uterine bleeding)   . Environmental allergies   . Pregnancy induced hypertension   . Preterm labor     Past Surgical History:  Procedure Laterality Date  . BILATERAL SALPINGECTOMY Bilateral 04/12/2014   Procedure: BILATERAL SALPINGECTOMY;  Surgeon: Adam Phenix, MD;  Location: [facility];  Service: Gynecology;  Laterality: Bilateral;  . CRYOTHERAPY    . LAPAROSCOPIC TUBAL LIGATION Bilateral 11/10/2013   Procedure: bilateral LAPAROSCOPIC TUBAL LIGATION with filshie clips;  Surgeon: Freddrick March. Tenny Craw, MD;  Location: [facility];  Service: Gynecology;  Laterality: Bilateral;  . TUBAL LIGATION    . VAGINAL HYSTERECTOMY N/A 04/12/2014   Procedure: HYSTERECTOMY VAGINAL;  Surgeon: Adam Phenix, MD;  Location: [facility];  Service: Gynecology;  Laterality: N/A;    There were no vitals filed for this visit.   Subjective Assessment - 11/28/20 0936    Subjective "I'm not doing well today at all. My legs are burning from the back of the thigs to my ankles. I  saw ortho and they are referring me to rheumatology because my bloodwork came back bad. This is how I feel when I feel like I'm going to fall. Is there anything you can do for the burning?" She reports having the burning sensation since Tuesday.    Currently in Pain? Yes    Pain Score 9     Pain Location Leg    Pain Orientation Right;Left    Pain Descriptors / Indicators Burning    Pain Type Chronic pain    Pain Onset In the past 7 days    Pain Frequency Constant                             [facility] Adult PT Treatment/Exercise - 11/28/20 0001      Self-Care   Other Self-Care Comments  see patient education      Lumbar Exercises: Supine   Other Supine Lumbar Exercises supine knees to chest 1 x 10 No effect      Lumbar Exercises: Prone   Other Prone Lumbar Exercises prone pressup 3 x 10 centralized pain from ankles to thighs    Other Prone Lumbar Exercises prone on elbows 5 minutes      Ankle Exercises: Seated   Towel Inversion/Eversion 1 rep   bilateral   Marble Pickup 10 marbles bilateral    Other  Seated Ankle Exercises ankle rockerboard 2 x 10 A/P and lateral                  PT Education - 11/28/20 1003    Education Details Updated HEP. Education on centalization phenomenon    Person(s) Educated Patient    Methods Explanation;Verbal cues;Handout    Comprehension Verbalized understanding;Returned demonstration            PT Short Term Goals - 11/25/20 1019      PT SHORT TERM GOAL #1   Title Pt will be compliant with 90% if HEP in order to improve carryover    Baseline Initial HEP given    Time 3    Period Weeks    Status On-going    Target Date 11/25/20      PT SHORT TERM GOAL #2   Title Pt will decrease 5xSTS to no greater than 20 seconds in order to improve comfort and functional mobility    Baseline see flowsheet    Time 3    Period Weeks    Status On-going    Target Date 11/25/20             PT Long Term Goals - 11/04/20 1341       PT LONG TERM GOAL #1   Title Pt will self report bilateral ankle pain no greater than 3/10 at worst in order improve comfort and functional ability    Baseline 9/10 pain at worst    Time 6    Period Weeks    Status New    Target Date 12/16/20      PT LONG TERM GOAL #2   Title Pt will be able to hold SLS on L LE for at least 25 seconds in order to improve stability and balance    Baseline unable    Time 6    Period Weeks    Status New    Target Date 12/16/20      PT LONG TERM GOAL #3   Title Pt will decrease future LEFS score by MDC in order to improve comfort and functional ability    Baseline LEFS at next session    Time 6    Period Weeks    Status New    Target Date 12/16/20      PT LONG TERM GOAL #4   Title Pt will increase bilateral ankle strength in tested motions to no less than 4/5 in order to improve stability    Baseline See flowsheet    Time 6    Period Weeks    Status New    Target Date 12/16/20                 Plan - 11/28/20 0934    Clinical Impression Statement Patient arrives with increased pain reporting burning sensation about BLEs from her posterior thighs to lateral ankles that has been ongoing since Tuesday. She had f/u with orthopedics who have referred her to rheumatology due to bloodwork findings. Session focused on trialing positions/movements to reduce BLE burning sensation. No effect with repeated trunk flexion in supine. With prone pressup burning sensation was centralized from lower leg/ankles to thighs and with sustained prone on elbows intensity of burning sensation in her thighs reduced to 5/10. These findings suggests potential lumbar radiculopathy as she demonstrates centralization of pain with extension biased movement. After decreasing her burning pain she was able to tolerate ankle ther ex well.    PT Treatment/Interventions ADLs/Self Care  Home Management;Electrical Stimulation;Moist Heat;Ultrasound;Gait training;Stair  training;Functional mobility training;Therapeutic activities;Therapeutic exercise;Balance training;Neuromuscular re-education;Manual techniques;Dry needling;Joint Manipulations    PT Next Visit Plan review/update HEP.  progress proprioceptive exercises as able    PT Home Exercise Plan [id number]    Consulted and Agree with Plan of Care Patient           Patient will benefit from skilled therapeutic intervention in order to improve the following deficits and impairments:  Abnormal gait,Decreased activity tolerance,Decreased balance,Decreased endurance,Decreased range of motion,Decreased strength,Pain  Visit Diagnosis: Muscle weakness (generalized)  Impaired gait  Impaired functional mobility, balance, and endurance     Problem List Patient Active Problem List   Diagnosis Date Noted  . Postoperative state 04/12/2014  . DUB (dysfunctional uterine bleeding) 03/08/2014  . Dysmenorrhea 03/08/2014  Letitia Libra, PT, DPT, ATC 11/28/20 10:16 AM  [facility] Outpatient Rehabilitation [facility] [address] [place], [place], [address] Phone: [phone]   Fax:  [phone]  Name: RENELLE STEGENGA MRN: [id number] Date of Birth: September 10, 1985

## 2020-12-02 ENCOUNTER — Encounter: Payer: Self-pay | Admitting: Internal Medicine

## 2020-12-02 ENCOUNTER — Ambulatory Visit (INDEPENDENT_AMBULATORY_CARE_PROVIDER_SITE_OTHER): Payer: Medicaid Other | Admitting: Internal Medicine

## 2020-12-02 ENCOUNTER — Other Ambulatory Visit: Payer: Self-pay

## 2020-12-02 VITALS — BP 163/108 | HR 78 | Resp 16

## 2020-12-02 DIAGNOSIS — Z7689 Persons encountering health services in other specified circumstances: Secondary | ICD-10-CM

## 2020-12-02 DIAGNOSIS — I10 Essential (primary) hypertension: Secondary | ICD-10-CM | POA: Diagnosis not present

## 2020-12-02 DIAGNOSIS — K529 Noninfective gastroenteritis and colitis, unspecified: Secondary | ICD-10-CM | POA: Diagnosis not present

## 2020-12-02 DIAGNOSIS — M792 Neuralgia and neuritis, unspecified: Secondary | ICD-10-CM

## 2020-12-02 MED ORDER — LOSARTAN POTASSIUM 25 MG PO TABS: 25.0000 mg | ORAL_TABLET | Freq: Every day | ORAL | 1 refills | Status: DC

## 2020-12-02 MED ORDER — GABAPENTIN 300 MG PO CAPS: 300.0000 mg | ORAL_CAPSULE | Freq: Three times a day (TID) | ORAL | 0 refills | Status: DC

## 2020-12-02 NOTE — Progress Notes (Signed)
Off amlodipine x 2 weeks Feels like it is too strong, feels unsteady  Stomach concerns- Anorexia Diarrhea  Patient with hysterctomy

## 2020-12-02 NOTE — Progress Notes (Signed)
Subjective:    Brenda Blankenship - 35 y.o. female MRN [id number]  Date of birth: 1985/09/17  HPI  Brenda Blankenship is to establish care. Patient has a PMH significant for HTN. She is taking HCTZ. She tried Amlodipine for 4-5 months and it made her feel off. She discontinued her Amlodipine a few weeks ago. She monitors at home. Reports her BP has been higher than normal which she attributes to stress. But she can never get her BP well controlled.   Had a hysterectomy in 2015 for DUB. Cervix was removed.   Reports that she has had bilateral muscle weakness of her lower extremities. She has been following with [facility] since Jan. Is alternating boots. Followed by PT. Had labs done and was referred to rheumatology.   Reports concerns about chronic stomach issues. This has been occurring. Has episodes where she has diarrhea immediately after eating. Also occurs with drinking alcohol. Has cramping and feels like things are moving quickly through her intestines. Was seen at Urgent Care a couple years years ago and prescribed a medication that helped but she wants to get to the bottom of it. Has noticed blood, characterized as light pink, in her stool just a couple times.    ROS per HPI   Health Maintenance:  Health Maintenance Due  Topic Date Due  . COVID-19 Vaccine (1) Never done  . TETANUS/TDAP  Never done  . PAP SMEAR-Modifier  Never done   Past Medical History: Patient Active Problem List   Diagnosis Date Noted  . Postoperative state 04/12/2014  . DUB (dysfunctional uterine bleeding) 03/08/2014  . Dysmenorrhea 03/08/2014      Social History   reports that she has been smoking cigarettes. She has been smoking about 0.50 packs per day for the past 0.00 years. She has never used smokeless tobacco. She reports current alcohol use. She reports that she does not use drugs.   Family History  family history includes Cancer in her paternal grandmother; Diabetes in her maternal grandmother;  Hyperlipidemia in her father; Hypertension in her father, mother, and paternal grandmother.   Medications: reviewed and updated   Objective:   Physical Exam BP (!) 163/108   Pulse 78   Resp 16   LMP 03/09/2014   SpO2 99%  Physical Exam Constitutional:      General: She is not in acute distress.    Appearance: She is not diaphoretic.  HENT:     Head: Normocephalic and atraumatic.  Eyes:     Conjunctiva/sclera: Conjunctivae normal.  Cardiovascular:     Rate and Rhythm: Normal rate and regular rhythm.     Heart sounds: Normal heart sounds. No murmur heard.   Pulmonary:     Effort: Pulmonary effort is normal. No respiratory distress.     Breath sounds: Normal breath sounds.  Musculoskeletal:        General: Normal range of motion.  Skin:    General: Skin is warm and dry.  Neurological:     Mental Status: She is alert and oriented to person, place, and time.  Psychiatric:        Mood and Affect: Affect normal.        Judgment: Judgment normal.         Assessment & Plan:   1. Encounter to establish care Reviewed patient's PMH, social history, surgical history, and medications.  Is overdue for annual exam, screening blood work, and health maintenance topics. Have asked patient to return for visit to  address these items.   2. Essential hypertension Discussed with patient that BP is above goal. Advised that as BP becomes more optimally controlled, can sometimes feel like having hypotension. Continue HCTZ. Add Losartan. Lab monitoring with medication change.   - losartan (COZAAR) 25 MG tablet; Take 1 tablet (25 mg total) by mouth daily.  Dispense: 30 tablet; Refill: 1 - Basic Metabolic Panel  3. Neuropathic pain - gabapentin (NEURONTIN) 300 MG capsule; Take 1 capsule (300 mg total) by mouth 3 (three) times daily.  Dispense: 90 capsule; Refill: 0 - Basic Metabolic Panel  4. Chronic diarrhea Sounds most concerning for IBS. Will refer to GI for further work up.  -  Ambulatory referral to Gastroenterology    Marcy Siren, D.O. 12/02/2020, 2:57 PM Primary Care at [facility]

## 2020-12-03 ENCOUNTER — Telehealth: Payer: Self-pay

## 2020-12-03 ENCOUNTER — Ambulatory Visit: Payer: Medicaid Other

## 2020-12-03 NOTE — Telephone Encounter (Signed)
PT called patient who stated she accidentally called the wrong office and meant to cancel her appointment as she was in the emergency room with her son. Confirmed next appointment.

## 2020-12-04 LAB — BASIC METABOLIC PANEL
BUN/Creatinine Ratio: 8 — ABNORMAL LOW (ref 9–23)
BUN: 8 mg/dL (ref 6–20)
CO2: 21 mmol/L (ref 20–29)
Calcium: 9.1 mg/dL (ref 8.7–10.2)
Chloride: 99 mmol/L (ref 96–106)
Creatinine, Ser: 1.01 mg/dL — ABNORMAL HIGH (ref 0.57–1.00)
Glucose: 96 mg/dL (ref 65–99)
Potassium: 3.9 mmol/L (ref 3.5–5.2)
Sodium: 136 mmol/L (ref 134–144)
eGFR: 75 mL/min/{1.73_m2} (ref >59)

## 2020-12-04 LAB — SPECIMEN STATUS REPORT

## 2020-12-05 ENCOUNTER — Ambulatory Visit: Payer: Medicaid Other

## 2020-12-05 ENCOUNTER — Other Ambulatory Visit: Payer: Self-pay

## 2020-12-05 DIAGNOSIS — Z7409 Other reduced mobility: Secondary | ICD-10-CM

## 2020-12-05 DIAGNOSIS — R269 Unspecified abnormalities of gait and mobility: Secondary | ICD-10-CM | POA: Diagnosis not present

## 2020-12-05 DIAGNOSIS — M6281 Muscle weakness (generalized): Secondary | ICD-10-CM

## 2020-12-05 NOTE — Therapy (Signed)
[facility] [address] [place], [place], [address] Phone: [phone]   Fax:  [phone]  Physical Therapy Treatment  Patient Details  Name: Brenda Blankenship MRN: [id number] Date of Birth: 1985-08-25 Referring Provider (PT): Frederico Hamman, MD   Encounter Date: 12/05/2020   PT End of Session - 12/05/20 0933    Visit Number 5    Number of Visits 13    Date for PT Re-Evaluation 12/23/20    Authorization Type [employer]    Authorization Time Period 11/11/2020-01/10/2021    Authorization - Visit Number 4    Authorization - Number of Visits 12    PT Start Time 0933    PT Stop Time 1013    PT Time Calculation (min) 40 min    Activity Tolerance Patient tolerated treatment well    Behavior During Therapy [facility] for tasks assessed/performed           Past Medical History:  Diagnosis Date  . Abnormal cervical Papanicolaou smear    cryo  . Anemia   . DUB (dysfunctional uterine bleeding)   . Environmental allergies   . Pregnancy induced hypertension   . Preterm labor     Past Surgical History:  Procedure Laterality Date  . BILATERAL SALPINGECTOMY Bilateral 04/12/2014   Procedure: BILATERAL SALPINGECTOMY;  Surgeon: Adam Phenix, MD;  Location: [facility];  Service: Gynecology;  Laterality: Bilateral;  . CRYOTHERAPY    . LAPAROSCOPIC TUBAL LIGATION Bilateral 11/10/2013   Procedure: bilateral LAPAROSCOPIC TUBAL LIGATION with filshie clips;  Surgeon: Freddrick March. Tenny Craw, MD;  Location: [facility];  Service: Gynecology;  Laterality: Bilateral;  . TUBAL LIGATION    . VAGINAL HYSTERECTOMY N/A 04/12/2014   Procedure: HYSTERECTOMY VAGINAL;  Surgeon: Adam Phenix, MD;  Location: [facility];  Service: Gynecology;  Laterality: N/A;    There were no vitals filed for this visit.   Subjective Assessment - 12/05/20 0937    Subjective She had f/u with PCP this week who prescribed gabapentin for her nerve pain, but has not started taking it yet. She has  been completing sustained and repeated trunk extension, which has helped with her pain.  She has not heard from rheumatology yet.    Currently in Pain? Yes    Pain Score 5     Pain Orientation Left;Right;Posterior;Lateral    Pain Descriptors / Indicators Throbbing    Pain Type Chronic pain    Pain Onset More than a month ago    Pain Frequency Constant                             [facility] Adult PT Treatment/Exercise - 12/05/20 0001      Self-Care   Other Self-Care Comments  see patient education      Neuro Re-ed    Neuro Re-ed Details  romberg eyes open 1 x 30 sec, romberg eyes closed 3 x 30 sec, semi-tandem 3 x 30 sec each      Ankle Exercises: Seated   Other Seated Ankle Exercises 4 way ankle red band 2 x 10 bilateral      Ankle Exercises: Stretches   Slant Board Stretch 60 seconds      Ankle Exercises: Standing   Other Standing Ankle Exercises lateral and A/P weight shifts 2 x 20 each                  PT Education - 12/05/20 6269  Education Details Issued Red theraband for 4 way ankle. Updated HEP.    Person(s) Educated Patient    Methods Explanation;Handout;Verbal cues    Comprehension Verbalized understanding;Returned demonstration;Verbal cues required            PT Short Term Goals - 11/25/20 1019      PT SHORT TERM GOAL #1   Title Pt will be compliant with 90% if HEP in order to improve carryover    Baseline Initial HEP given    Time 3    Period Weeks    Status On-going    Target Date 11/25/20      PT SHORT TERM GOAL #2   Title Pt will decrease 5xSTS to no greater than 20 seconds in order to improve comfort and functional mobility    Baseline see flowsheet    Time 3    Period Weeks    Status On-going    Target Date 11/25/20             PT Long Term Goals - 11/04/20 1341      PT LONG TERM GOAL #1   Title Pt will self report bilateral ankle pain no greater than 3/10 at worst in order improve comfort and functional ability     Baseline 9/10 pain at worst    Time 6    Period Weeks    Status New    Target Date 12/16/20      PT LONG TERM GOAL #2   Title Pt will be able to hold SLS on L LE for at least 25 seconds in order to improve stability and balance    Baseline unable    Time 6    Period Weeks    Status New    Target Date 12/16/20      PT LONG TERM GOAL #3   Title Pt will decrease future LEFS score by MDC in order to improve comfort and functional ability    Baseline LEFS at next session    Time 6    Period Weeks    Status New    Target Date 12/16/20      PT LONG TERM GOAL #4   Title Pt will increase bilateral ankle strength in tested motions to no less than 4/5 in order to improve stability    Baseline See flowsheet    Time 6    Period Weeks    Status New    Target Date 12/16/20                 Plan - 12/05/20 0940    Clinical Impression Statement Patient arrives with less pain compared to previous session. She is responding well to extension biased movements that were prescribed last week to reduce her burning pain in BLEs, though continues to have constant bilateral posterolateral ankle pain described as throbbing. Continued with ankle strengthening and began standing exercises without CAM boot focusing on weight shifting and balance training.  She tolerated standing activity well without increased pain reported and minimal postural sway noted with static balance activity.She was recommended to bring shoes at next session as she only had 1 Croc, so standing exercises were performed barefoot today.    PT Treatment/Interventions ADLs/Self Care Home Management;Electrical Stimulation;Moist Heat;Ultrasound;Gait training;Stair training;Functional mobility training;Therapeutic activities;Therapeutic exercise;Balance training;Neuromuscular re-education;Manual techniques;Dry needling;Joint Manipulations    PT Next Visit Plan did patient bring shoes? BLE strengthening,  progress proprioceptive  exercises as able    PT Home Exercise Plan [email or web address]  Consulted and Agree with Plan of Care Patient           Patient will benefit from skilled therapeutic intervention in order to improve the following deficits and impairments:  Abnormal gait,Decreased activity tolerance,Decreased balance,Decreased endurance,Decreased range of motion,Decreased strength,Pain  Visit Diagnosis: Muscle weakness (generalized)  Impaired gait  Impaired functional mobility, balance, and endurance     Problem List Patient Active Problem List   Diagnosis Date Noted  . Postoperative state 04/12/2014  . DUB (dysfunctional uterine bleeding) 03/08/2014  . Dysmenorrhea 03/08/2014   Letitia Libra, PT, DPT, ATC 12/05/20 10:14 AM  [facility] [address] [place], [place], [address] Phone: [phone]   Fax:  [phone]  Name: Brenda Blankenship MRN: [id number] Date of Birth: Apr 23, 1986

## 2020-12-10 ENCOUNTER — Ambulatory Visit: Payer: Medicaid Other

## 2020-12-12 ENCOUNTER — Other Ambulatory Visit: Payer: Self-pay

## 2020-12-12 ENCOUNTER — Ambulatory Visit: Payer: Medicaid Other

## 2020-12-12 DIAGNOSIS — Z7409 Other reduced mobility: Secondary | ICD-10-CM

## 2020-12-12 DIAGNOSIS — M6281 Muscle weakness (generalized): Secondary | ICD-10-CM | POA: Diagnosis not present

## 2020-12-12 DIAGNOSIS — R269 Unspecified abnormalities of gait and mobility: Secondary | ICD-10-CM

## 2020-12-12 NOTE — Therapy (Signed)
[facility] Outpatient [facility] [address] [place], [place], [address] Phone: [phone]   Fax:  [phone]  Physical Therapy Treatment  Patient Details  Name: Brenda Blankenship MRN: [id number] Date of Birth: 18-Jan-1986 Referring Provider (PT): Frederico Hamman, MD   Encounter Date: 12/12/2020   PT End of Session - 12/12/20 1018    Visit Number 6    Number of Visits 13    Date for PT Re-Evaluation 12/23/20    Authorization Type [employer]    Authorization Time Period 11/11/2020-01/10/2021    Authorization - Visit Number 5    Authorization - Number of Visits 12    PT Start Time 1018    PT Stop Time 1058    PT Time Calculation (min) 40 min    Activity Tolerance Patient tolerated treatment well    Behavior During Therapy [facility] for tasks assessed/performed           Past Medical History:  Diagnosis Date  . Abnormal cervical Papanicolaou smear    cryo  . Anemia   . DUB (dysfunctional uterine bleeding)   . Environmental allergies   . Pregnancy induced hypertension   . Preterm labor     Past Surgical History:  Procedure Laterality Date  . BILATERAL SALPINGECTOMY Bilateral 04/12/2014   Procedure: BILATERAL SALPINGECTOMY;  Surgeon: Adam Phenix, MD;  Location: [facility];  Service: Gynecology;  Laterality: Bilateral;  . CRYOTHERAPY    . LAPAROSCOPIC TUBAL LIGATION Bilateral 11/10/2013   Procedure: bilateral LAPAROSCOPIC TUBAL LIGATION with filshie clips;  Surgeon: Freddrick March. Tenny Craw, MD;  Location: [facility];  Service: Gynecology;  Laterality: Bilateral;  . TUBAL LIGATION    . VAGINAL HYSTERECTOMY N/A 04/12/2014   Procedure: HYSTERECTOMY VAGINAL;  Surgeon: Adam Phenix, MD;  Location: [facility];  Service: Gynecology;  Laterality: N/A;    There were no vitals filed for this visit.   Subjective Assessment - 12/12/20 1020    Subjective "Definitely feeling a good difference." She reports going without the CAM boot at home and it has been ok during the day,  but gets more difficult as the day goes on and notices some swelling and pain. She has started gabapentin and has not experienced any of the burning sensation in BLE. She reports compliance with HEP.    Currently in Pain? Yes    Pain Score 4     Pain Location Ankle    Pain Orientation Right;Left;Posterior;Lateral    Pain Descriptors / Indicators Throbbing;Aching    Pain Type Chronic pain    Pain Onset More than a month ago    Pain Frequency Constant              [facility] PT Assessment - 12/12/20 0001      Standardized Balance Assessment   Five times sit to stand comments  26 seconds (without CAM boot)                         [facility] Adult PT Treatment/Exercise - 12/12/20 0001      Ambulation/Gait   Pre-Gait Activities forward step overs small cone 2 x10 each    Gait Comments gait training focusing on push off and allowing for knee flexion and hip flexion      Knee/Hip Exercises: Seated   Sit to Sand 10 reps      Knee/Hip Exercises: Supine   Straight Leg Raises 10 reps    Straight Leg Raises Limitations x2; bilateral 1#  Ankle Exercises: Seated   Other Seated Ankle Exercises ankle rockerboard 2 x 10 A/P and lateral      Ankle Exercises: Stretches   Slant Board Stretch 60 seconds   gastroc and soleus     Ankle Exercises: Standing   Heel Raises 10 reps   x2                 PT Education - 12/12/20 1046    Education Details Updated HEP.    Person(s) Educated Patient    Methods Explanation;Demonstration;Verbal cues;Handout    Comprehension Verbalized understanding;Returned demonstration;Verbal cues required            PT Short Term Goals - 12/12/20 1025      PT SHORT TERM GOAL #1   Title Pt will be compliant with 90% if HEP in order to improve carryover    Baseline Initial HEP given    Time 3    Period Weeks    Status Achieved    Target Date 11/25/20      PT SHORT TERM GOAL #2   Title Pt will decrease 5xSTS to no greater than 20  seconds in order to improve comfort and functional mobility    Baseline see flowsheet    Time 3    Period Weeks    Status On-going    Target Date 11/25/20             PT Long Term Goals - 11/04/20 1341      PT LONG TERM GOAL #1   Title Pt will self report bilateral ankle pain no greater than 3/10 at worst in order improve comfort and functional ability    Baseline 9/10 pain at worst    Time 6    Period Weeks    Status New    Target Date 12/16/20      PT LONG TERM GOAL #2   Title Pt will be able to hold SLS on L LE for at least 25 seconds in order to improve stability and balance    Baseline unable    Time 6    Period Weeks    Status New    Target Date 12/16/20      PT LONG TERM GOAL #3   Title Pt will decrease future LEFS score by MDC in order to improve comfort and functional ability    Baseline LEFS at next session    Time 6    Period Weeks    Status New    Target Date 12/16/20      PT LONG TERM GOAL #4   Title Pt will increase bilateral ankle strength in tested motions to no less than 4/5 in order to improve stability    Baseline See flowsheet    Time 6    Period Weeks    Status New    Target Date 12/16/20                 Plan - 12/12/20 1022    Clinical Impression Statement Her 5 x STS test has improved compared to baseline, completing in 26 seconds today without CAM boot reporting no increase in pain. We were able to begin gait training without CAM boot today as patient brought tennis shoes for today's session. She initially demonstrates decreased hip/knee flexion bilaterally throughout gait cycle and limited push-off with improvement following pre-gait activities. She reported pulling sensation along achilles tendon during forward stepovers, though no increaesd pain. She reported fatigue in BLE following standing activity,  though no increased pain.    PT Treatment/Interventions ADLs/Self Care Home Management;Electrical Stimulation;Moist  Heat;Ultrasound;Gait training;Stair training;Functional mobility training;Therapeutic activities;Therapeutic exercise;Balance training;Neuromuscular re-education;Manual techniques;Dry needling;Joint Manipulations    PT Next Visit Plan gait training, BLE strength, balance training    PT Home Exercise Plan [email or web address]    Consulted and Agree with Plan of Care Patient           Patient will benefit from skilled therapeutic intervention in order to improve the following deficits and impairments:  Abnormal gait,Decreased activity tolerance,Decreased balance,Decreased endurance,Decreased range of motion,Decreased strength,Pain  Visit Diagnosis: Muscle weakness (generalized)  Impaired gait  Impaired functional mobility, balance, and endurance     Problem List Patient Active Problem List   Diagnosis Date Noted  . Postoperative state 04/12/2014  . DUB (dysfunctional uterine bleeding) 03/08/2014  . Dysmenorrhea 03/08/2014  Letitia Libra, PT, DPT, ATC 12/12/20 11:44 AM  [facility] [address] [place], [place], [address] Phone: [phone]   Fax:  [phone]  Name: Brenda Blankenship MRN: [id number] Date of Birth: 03/22/1986

## 2020-12-17 ENCOUNTER — Ambulatory Visit: Payer: Medicaid Other

## 2020-12-17 ENCOUNTER — Other Ambulatory Visit: Payer: Self-pay

## 2020-12-17 DIAGNOSIS — R269 Unspecified abnormalities of gait and mobility: Secondary | ICD-10-CM | POA: Diagnosis not present

## 2020-12-17 DIAGNOSIS — Z7409 Other reduced mobility: Secondary | ICD-10-CM | POA: Diagnosis not present

## 2020-12-17 DIAGNOSIS — M6281 Muscle weakness (generalized): Secondary | ICD-10-CM | POA: Diagnosis not present

## 2020-12-17 NOTE — Therapy (Signed)
[facility] [address] [place], [place], [address] Phone: [phone]   Fax:  [phone]  Physical Therapy Treatment  Patient Details  Name: Brenda Blankenship MRN: [id number] Date of Birth: [birth year] Referring Provider (PT): Frederico Hamman, MD   Encounter Date: 12/17/2020   PT End of Session - 12/17/20 0918    Visit Number 7    Number of Visits 13    Date for PT Re-Evaluation 12/23/20    Authorization Type [employer]    Authorization Time Period 11/11/2020-01/10/2021    Authorization - Visit Number 5    Authorization - Number of Visits 12    PT Start Time 0918    PT Stop Time 0958    PT Time Calculation (min) 40 min    Activity Tolerance Patient tolerated treatment well    Behavior During Therapy [facility] for tasks assessed/performed           Past Medical History:  Diagnosis Date  . Abnormal cervical Papanicolaou smear    cryo  . Anemia   . DUB (dysfunctional uterine bleeding)   . Environmental allergies   . Pregnancy induced hypertension   . Preterm labor     Past Surgical History:  Procedure Laterality Date  . BILATERAL SALPINGECTOMY Bilateral 04/12/2014   Procedure: BILATERAL SALPINGECTOMY;  Surgeon: Adam Phenix, MD;  Location: [facility];  Service: Gynecology;  Laterality: Bilateral;  . CRYOTHERAPY    . LAPAROSCOPIC TUBAL LIGATION Bilateral 11/10/2013   Procedure: bilateral LAPAROSCOPIC TUBAL LIGATION with filshie clips;  Surgeon: Freddrick March. Tenny Craw, MD;  Location: [facility];  Service: Gynecology;  Laterality: Bilateral;  . TUBAL LIGATION    . VAGINAL HYSTERECTOMY N/A 04/12/2014   Procedure: HYSTERECTOMY VAGINAL;  Surgeon: Adam Phenix, MD;  Location: [facility];  Service: Gynecology;  Laterality: N/A;    There were no vitals filed for this visit.   Subjective Assessment - 12/17/20 0919    Subjective Pt presents to PT with reports of continued R foot/ankle pain. She arrives in CAM boot today donned on R. She has been  compliant with ther HEP reporting muscle soreness. Pt is ready to begin PT treatment at this time.    Currently in Pain? Yes    Pain Score 4     Pain Location Foot    Pain Orientation Right                             [facility] Adult PT Treatment/Exercise - 12/17/20 0001      Ambulation/Gait   Pre-Gait Activities forward step overs small hurdle 2 x10 each    Gait Comments ambulate 60ft x 2 working on normalizing pattern and improving heel strike      Knee/Hip Exercises: Standing   Heel Raises Limitations heel-toe raises 2x10      Knee/Hip Exercises: Seated   Sit to Sand 10 reps;without UE support      Knee/Hip Exercises: Supine   Bridges 2 sets;10 reps      Knee/Hip Exercises: Sidelying   Clams 2x10 - green tband      Ankle Exercises: Seated   Other Seated Ankle Exercises ankle rockerboard 2 x 10 A/P and lateral    Other Seated Ankle Exercises seated heel toe raises x 20      Ankle Exercises: Supine   T-Band bil ankle DF/PF/Inv/Ev x 10 - green Tband      Ankle Exercises: Musician  Stretch 60 seconds                    PT Short Term Goals - 12/12/20 1025      PT SHORT TERM GOAL #1   Title Pt will be compliant with 90% if HEP in order to improve carryover    Baseline Initial HEP given    Time 3    Period Weeks    Status Achieved    Target Date 11/25/20      PT SHORT TERM GOAL #2   Title Pt will decrease 5xSTS to no greater than 20 seconds in order to improve comfort and functional mobility    Baseline see flowsheet    Time 3    Period Weeks    Status On-going    Target Date 11/25/20             PT Long Term Goals - 11/04/20 1341      PT LONG TERM GOAL #1   Title Pt will self report bilateral ankle pain no greater than 3/10 at worst in order improve comfort and functional ability    Baseline 9/10 pain at worst    Time 6    Period Weeks    Status New    Target Date 12/16/20      PT LONG TERM GOAL #2   Title Pt  will be able to hold SLS on L LE for at least 25 seconds in order to improve stability and balance    Baseline unable    Time 6    Period Weeks    Status New    Target Date 12/16/20      PT LONG TERM GOAL #3   Title Pt will decrease future LEFS score by MDC in order to improve comfort and functional ability    Baseline LEFS at next session    Time 6    Period Weeks    Status New    Target Date 12/16/20      PT LONG TERM GOAL #4   Title Pt will increase bilateral ankle strength in tested motions to no less than 4/5 in order to improve stability    Baseline See flowsheet    Time 6    Period Weeks    Status New    Target Date 12/16/20                 Plan - 12/17/20 0948    Clinical Impression Statement Pt was able to complete all prescribed exercises with no adverse effect. She does continue to have decreased heel strike on R LE during out of boot pre gait activities. She continues to benefit from skilled PT services working towards increasing LE strength and gait pattern. Will continue with POC as prescribed.    PT Treatment/Interventions ADLs/Self Care Home Management;Electrical Stimulation;Moist Heat;Ultrasound;Gait training;Stair training;Functional mobility training;Therapeutic activities;Therapeutic exercise;Balance training;Neuromuscular re-education;Manual techniques;Dry needling;Joint Manipulations    PT Next Visit Plan gait training, BLE strength, balance training    PT Home Exercise Plan [id number]    Consulted and Agree with Plan of Care Patient           Patient will benefit from skilled therapeutic intervention in order to improve the following deficits and impairments:  Abnormal gait,Decreased activity tolerance,Decreased balance,Decreased endurance,Decreased range of motion,Decreased strength,Pain  Visit Diagnosis: Muscle weakness (generalized)  Impaired gait  Impaired functional mobility, balance, and endurance     Problem List Patient Active  Problem List   Diagnosis  Date Noted  . Postoperative state 04/12/2014  . DUB (dysfunctional uterine bleeding) 03/08/2014  . Dysmenorrhea 03/08/2014    Eloy End, PT, DPT 12/17/20 9:59 AM  [facility] [address] [place], [place], [address] Phone: [phone]   Fax:  [phone]  Name: Brenda Blankenship MRN: [id number] Date of Birth: 01/31/1986

## 2020-12-19 ENCOUNTER — Ambulatory Visit: Payer: Medicaid Other

## 2020-12-24 ENCOUNTER — Encounter: Payer: Self-pay | Admitting: Gastroenterology

## 2020-12-24 ENCOUNTER — Ambulatory Visit: Payer: Medicaid Other

## 2020-12-25 DIAGNOSIS — Z419 Encounter for procedure for purposes other than remedying health state, unspecified: Secondary | ICD-10-CM | POA: Diagnosis not present

## 2020-12-26 ENCOUNTER — Other Ambulatory Visit: Payer: Self-pay

## 2020-12-26 ENCOUNTER — Ambulatory Visit: Payer: Medicaid Other | Attending: Orthopedic Surgery

## 2020-12-26 DIAGNOSIS — R269 Unspecified abnormalities of gait and mobility: Secondary | ICD-10-CM | POA: Insufficient documentation

## 2020-12-26 DIAGNOSIS — M6281 Muscle weakness (generalized): Secondary | ICD-10-CM | POA: Diagnosis not present

## 2020-12-26 DIAGNOSIS — Z7409 Other reduced mobility: Secondary | ICD-10-CM | POA: Diagnosis not present

## 2020-12-26 NOTE — Therapy (Signed)
[facility] [address] [place], [place], [address] Phone: [phone]   Fax:  [phone]  Physical Therapy Treatment  Patient Details  Name: Brenda Blankenship MRN: [id number] Date of Birth: 08/30/1985 Referring Provider (PT): Frederico Hamman, MD   Encounter Date: 12/26/2020   PT End of Session - 12/26/20 1003    Visit Number 8    Number of Visits 13    Date for PT Re-Evaluation 12/23/20    Authorization Type [employer]    Authorization Time Period 11/11/2020-01/10/2021    Authorization - Visit Number 8    Authorization - Number of Visits 12    PT Start Time 0925   arrived late   PT Stop Time 1001    PT Time Calculation (min) 36 min    Activity Tolerance Patient tolerated treatment well    Behavior During Therapy [facility] for tasks assessed/performed           Past Medical History:  Diagnosis Date  . Abnormal cervical Papanicolaou smear    cryo  . Anemia   . DUB (dysfunctional uterine bleeding)   . Environmental allergies   . Pregnancy induced hypertension   . Preterm labor     Past Surgical History:  Procedure Laterality Date  . BILATERAL SALPINGECTOMY Bilateral 04/12/2014   Procedure: BILATERAL SALPINGECTOMY;  Surgeon: Adam Phenix, MD;  Location: [facility];  Service: Gynecology;  Laterality: Bilateral;  . CRYOTHERAPY    . LAPAROSCOPIC TUBAL LIGATION Bilateral 11/10/2013   Procedure: bilateral LAPAROSCOPIC TUBAL LIGATION with filshie clips;  Surgeon: Freddrick March. Tenny Craw, MD;  Location: [facility];  Service: Gynecology;  Laterality: Bilateral;  . TUBAL LIGATION    . VAGINAL HYSTERECTOMY N/A 04/12/2014   Procedure: HYSTERECTOMY VAGINAL;  Surgeon: Adam Phenix, MD;  Location: [facility];  Service: Gynecology;  Laterality: N/A;    There were no vitals filed for this visit.   Subjective Assessment - 12/26/20 0927    Subjective Pt presents to PT with reports of continued bilateral foot/ankle pain and discomfort. She arrives out of CAM  boot and notes that she has been doing so since last visit. Pt has been compliant with her HEP with no adverse effect. She is ready to begin PT treatment at this time.    Currently in Pain? Yes    Pain Score 5     Pain Location Ankle    Pain Orientation Left   R ankle 4/10                            [facility] Adult PT Treatment/Exercise - 12/26/20 0001      Neuro Re-ed    Neuro Re-ed Details  FT EO x 30 sec; FT EO on foam 2x30 sec;      Lumbar Exercises: Stretches   Prone on Elbows Stretch Limitations 90 seconds    Press Ups 10 reps      Knee/Hip Exercises: Standing   Heel Raises Limitations heel-toe raises 2x10    Functional Squat 2 sets;10 reps    Functional Squat Limitations holding freemotion handle      Ankle Exercises: Seated   Other Seated Ankle Exercises ankle rockerboard x 10 fwd/bwd also x 10 medial/lateral ea foot    Other Seated Ankle Exercises seated heel toe raises x 15 - 10lb DB on knees      Ankle Exercises: Supine   T-Band bil ankle DF/PF/Inv/Ev 2 x 10 - Red Tband  PT Short Term Goals - 12/12/20 1025      PT SHORT TERM GOAL #1   Title Pt will be compliant with 90% if HEP in order to improve carryover    Baseline Initial HEP given    Time 3    Period Weeks    Status Achieved    Target Date 11/25/20      PT SHORT TERM GOAL #2   Title Pt will decrease 5xSTS to no greater than 20 seconds in order to improve comfort and functional mobility    Baseline see flowsheet    Time 3    Period Weeks    Status On-going    Target Date 11/25/20             PT Long Term Goals - 11/04/20 1341      PT LONG TERM GOAL #1   Title Pt will self report bilateral ankle pain no greater than 3/10 at worst in order improve comfort and functional ability    Baseline 9/10 pain at worst    Time 6    Period Weeks    Status New    Target Date 12/16/20      PT LONG TERM GOAL #2   Title Pt will be able to hold SLS on L LE for at  least 25 seconds in order to improve stability and balance    Baseline unable    Time 6    Period Weeks    Status New    Target Date 12/16/20      PT LONG TERM GOAL #3   Title Pt will decrease future LEFS score by MDC in order to improve comfort and functional ability    Baseline LEFS at next session    Time 6    Period Weeks    Status New    Target Date 12/16/20      PT LONG TERM GOAL #4   Title Pt will increase bilateral ankle strength in tested motions to no less than 4/5 in order to improve stability    Baseline See flowsheet    Time 6    Period Weeks    Status New    Target Date 12/16/20                 Plan - 12/26/20 1003    Clinical Impression Statement Pt tolerated treatment fair but continued to note pain during standing and supine ankle strengthening. Pt also continues to show instability in narrow BoS and general intability in bilateral ankles. She continues to benefit from skilled PT working on improving LE strength, stability, and gait. Will continue to progress as able.    PT Treatment/Interventions ADLs/Self Care Home Management;Electrical Stimulation;Moist Heat;Ultrasound;Gait training;Stair training;Functional mobility training;Therapeutic activities;Therapeutic exercise;Balance training;Neuromuscular re-education;Manual techniques;Dry needling;Joint Manipulations    PT Next Visit Plan gait training, BLE strength, balance training    PT Home Exercise Plan [id number]           Patient will benefit from skilled therapeutic intervention in order to improve the following deficits and impairments:  Abnormal gait,Decreased activity tolerance,Decreased balance,Decreased endurance,Decreased range of motion,Decreased strength,Pain  Visit Diagnosis: Muscle weakness (generalized)  Impaired gait  Impaired functional mobility, balance, and endurance     Problem List Patient Active Problem List   Diagnosis Date Noted  . Postoperative state 04/12/2014  . DUB  (dysfunctional uterine bleeding) 03/08/2014  . Dysmenorrhea 03/08/2014    Eloy End, PT, DPT 12/26/20 10:06 AM  [facility] [address] [place], [place], [address] Phone: [phone]   Fax:  [phone]  Name: Brenda Blankenship MRN: [id number] Date of Birth: October 10, 1985

## 2020-12-31 ENCOUNTER — Ambulatory Visit: Payer: Medicaid Other

## 2020-12-31 ENCOUNTER — Other Ambulatory Visit: Payer: Self-pay

## 2020-12-31 DIAGNOSIS — R269 Unspecified abnormalities of gait and mobility: Secondary | ICD-10-CM | POA: Diagnosis not present

## 2020-12-31 DIAGNOSIS — Z7409 Other reduced mobility: Secondary | ICD-10-CM | POA: Diagnosis not present

## 2020-12-31 DIAGNOSIS — M6281 Muscle weakness (generalized): Secondary | ICD-10-CM | POA: Diagnosis not present

## 2020-12-31 NOTE — Therapy (Addendum)
[facility] [address] [place], [place], [address] Phone: [phone]   Fax:  [phone]  Physical Therapy Treatment/Re-Eval/Discharge  Patient Details  Name: Brenda Blankenship MRN: [id number] Date of Birth: October 11, 1985 Referring Provider (PT): Frederico Hamman, MD   Encounter Date: 12/31/2020   PT End of Session - 12/31/20 0916     Visit Number 9    Number of Visits 13    Date for PT Re-Evaluation 01/10/21    Authorization Type [employer]    Authorization Time Period 11/11/2020-01/10/2021    Authorization - Visit Number 8    Authorization - Number of Visits 12    PT Start Time 0916    PT Stop Time 0956    PT Time Calculation (min) 40 min    Activity Tolerance Patient tolerated treatment well    Behavior During Therapy [facility] for tasks assessed/performed             Past Medical History:  Diagnosis Date   Abnormal cervical Papanicolaou smear    cryo   Anemia    DUB (dysfunctional uterine bleeding)    Environmental allergies    Pregnancy induced hypertension    Preterm labor     Past Surgical History:  Procedure Laterality Date   BILATERAL SALPINGECTOMY Bilateral 04/12/2014   Procedure: BILATERAL SALPINGECTOMY;  Surgeon: Adam Phenix, MD;  Location: [facility];  Service: Gynecology;  Laterality: Bilateral;   CRYOTHERAPY     LAPAROSCOPIC TUBAL LIGATION Bilateral 11/10/2013   Procedure: bilateral LAPAROSCOPIC TUBAL LIGATION with filshie clips;  Surgeon: Freddrick March. Tenny Craw, MD;  Location: [facility];  Service: Gynecology;  Laterality: Bilateral;   TUBAL LIGATION     VAGINAL HYSTERECTOMY N/A 04/12/2014   Procedure: HYSTERECTOMY VAGINAL;  Surgeon: Adam Phenix, MD;  Location: [facility];  Service: Gynecology;  Laterality: N/A;    There were no vitals filed for this visit.   Subjective Assessment - 12/31/20 0916     Subjective Pt presents to PT with reports of increased bilateral foot/ankle pain. She notes increased pain after doing  a lot of walking and standing over weekend of graduation. Pt has been compliant with her HEP with no adverse effect. Pt is ready to begin PT treatment at this time.    Currently in Pain? Yes    Pain Score 6     Pain Location Ankle    Pain Orientation Right;Left                [facility] PT Assessment - 12/31/20 0001       Transfers   Five time sit to stand comments  20 seconds - no increase pain                           [facility] Adult PT Treatment/Exercise - 12/31/20 0001       Neuro Re-ed    Neuro Re-ed Details  FT EO x 30 sec; Semi-Tandem x 30 sec; FT EO on foam 2x30 sec; marching on foam 2x10      Lumbar Exercises: Prone   Other Prone Lumbar Exercises POE x 1 min; prone press up x 10      Knee/Hip Exercises: Standing   Functional Squat 2 sets;10 reps    Functional Squat Limitations with UE support squat to chair      Knee/Hip Exercises: Supine   Bridges 2 sets;10 reps    Straight Leg Raises 2 sets;10 reps;Both  Knee/Hip Exercises: Prone   Hamstring Curl 10 reps    Hamstring Curl Limitations ea      Ankle Exercises: Aerobic   Nustep lvl 5 LE only x 3 min while taking subjective      Ankle Exercises: Standing   Heel Raises 15 reps      Ankle Exercises: Seated   Other Seated Ankle Exercises ankle rockerboard 2x15 fwd/bwd also 2x15 medial/lateral ea foot    Other Seated Ankle Exercises seated heel toe raises x 15 - 10lb DB on knees                      PT Short Term Goals - 12/31/20 0954       PT SHORT TERM GOAL #1   Title Pt will be compliant with 90% if HEP in order to improve carryover    Baseline Initial HEP given    Time 3    Period Weeks    Status Achieved    Target Date 11/25/20      PT SHORT TERM GOAL #2   Title Pt will decrease 5xSTS to no greater than 20 seconds in order to improve comfort and functional mobility    Baseline see flowsheet    Time 3    Period Weeks    Status Achieved    Target Date 11/25/20                PT Long Term Goals - 12/31/20 0954       PT LONG TERM GOAL #1   Title Pt will self report bilateral ankle pain no greater than 3/10 at worst in order improve comfort and functional ability    Baseline 9/10 pain at worst    Time 2    Period Weeks    Status On-going    Target Date 01/10/21      PT LONG TERM GOAL #2   Title Pt will be able to hold SLS on L LE for at least 25 seconds in order to improve stability and balance    Baseline unable    Time 6    Period Weeks    Status On-going    Target Date 01/10/21      PT LONG TERM GOAL #3   Title Pt will decrease future LEFS score by MDC in order to improve comfort and functional ability    Baseline LEFS at next session    Time 6    Period Weeks    Status On-going    Target Date 01/10/21      PT LONG TERM GOAL #4   Title Pt will increase bilateral ankle strength in tested motions to no less than 4/5 in order to improve stability    Baseline See flowsheet    Time 6    Period Weeks    Status On-going    Target Date 01/10/21                   Plan - 12/31/20 0937     Clinical Impression Statement Pt once again tolerated treatment fair, but does continue to have bilateral ankle and foot pain with strengthening and standing narrow BoS balance exercises. She particularly has difficulty with compliant surfaces, with increased instability noted. She continues to benefit from skilled PT services working towards improving LE strength and ankle stability. Will continue to progress    Personal Factors and Comorbidities Comorbidity 1;Time since onset of injury/illness/exacerbation    Comorbidities HTN  Examination-Activity Limitations Squat;Stand    Examination-Participation Restrictions Shop;Yard Work;Community Activity    PT Frequency 2x / week    PT Duration 2 weeks    PT Treatment/Interventions ADLs/Self Care Home Management;Electrical Stimulation;Moist Heat;Ultrasound;Gait training;Stair training;Functional  mobility training;Therapeutic activities;Therapeutic exercise;Balance training;Neuromuscular re-education;Manual techniques;Dry needling;Joint Manipulations    PT Next Visit Plan gait training, BLE strength, balance training    PT Home Exercise Plan [id number]    Consulted and Agree with Plan of Care Patient             Patient will benefit from skilled therapeutic intervention in order to improve the following deficits and impairments:  Abnormal gait,Decreased activity tolerance,Decreased balance,Decreased endurance,Decreased range of motion,Decreased strength,Pain  Visit Diagnosis: Muscle weakness (generalized) - Plan: PT plan of care cert/re-cert  Impaired gait - Plan: PT plan of care cert/re-cert  Impaired functional mobility, balance, and endurance - Plan: PT plan of care cert/re-cert     Problem List Patient Active Problem List   Diagnosis Date Noted   Postoperative state 04/12/2014   DUB (dysfunctional uterine bleeding) 03/08/2014   Dysmenorrhea 03/08/2014    Eloy End, PT, DPT 12/31/20 9:57 AM  [facility] [address] [place], [place], [address] Phone: [phone]   Fax:  [phone]  Name: Brenda Blankenship MRN: [id number] Date of Birth: 1985/11/01  PHYSICAL THERAPY DISCHARGE SUMMARY  Visits from Start of Care: 9  Current functional level related to goals / functional outcomes: Unable to assess   Remaining deficits: Unable to assess   Education / Equipment: Unable to assess   Patient agrees to discharge. Patient goals were  could not assess . Patient is being discharged due to not returning since the last visit.

## 2021-01-02 ENCOUNTER — Ambulatory Visit: Payer: Medicaid Other

## 2021-01-08 ENCOUNTER — Telehealth: Payer: Self-pay

## 2021-01-08 ENCOUNTER — Ambulatory Visit: Payer: Medicaid Other

## 2021-01-08 NOTE — Telephone Encounter (Signed)
PT called and LVM regarding missed visit. Her POC ends on 01/10/21 - PT offered her two appointment slots on 01/09/21.  Eloy End, PT, DPT 01/08/21 11:55 AM

## 2021-01-24 DIAGNOSIS — Z419 Encounter for procedure for purposes other than remedying health state, unspecified: Secondary | ICD-10-CM | POA: Diagnosis not present

## 2021-01-30 DIAGNOSIS — M25571 Pain in right ankle and joints of right foot: Secondary | ICD-10-CM | POA: Diagnosis not present

## 2021-01-30 DIAGNOSIS — S62307A Unspecified fracture of fifth metacarpal bone, left hand, initial encounter for closed fracture: Secondary | ICD-10-CM | POA: Diagnosis not present

## 2021-01-30 DIAGNOSIS — M25572 Pain in left ankle and joints of left foot: Secondary | ICD-10-CM | POA: Diagnosis not present

## 2021-02-03 ENCOUNTER — Other Ambulatory Visit: Payer: Medicaid Other

## 2021-02-03 ENCOUNTER — Ambulatory Visit: Payer: Medicaid Other | Admitting: Gastroenterology

## 2021-02-03 ENCOUNTER — Encounter: Payer: Self-pay | Admitting: Gastroenterology

## 2021-02-03 VITALS — BP 154/100 | HR 64 | Ht 63.5 in | Wt 163.4 lb

## 2021-02-03 DIAGNOSIS — R197 Diarrhea, unspecified: Secondary | ICD-10-CM

## 2021-02-03 DIAGNOSIS — R634 Abnormal weight loss: Secondary | ICD-10-CM

## 2021-02-03 DIAGNOSIS — R112 Nausea with vomiting, unspecified: Secondary | ICD-10-CM

## 2021-02-03 DIAGNOSIS — R1084 Generalized abdominal pain: Secondary | ICD-10-CM | POA: Diagnosis not present

## 2021-02-03 MED ORDER — SUTAB 1479-225-188 MG PO TABS: 1.0000 | ORAL_TABLET | Freq: Once | ORAL | 0 refills | Status: AC

## 2021-02-03 MED ORDER — DICYCLOMINE HCL 10 MG PO CAPS: 10.0000 mg | ORAL_CAPSULE | Freq: Four times a day (QID) | ORAL | 0 refills | Status: AC | PRN

## 2021-02-03 NOTE — Patient Instructions (Addendum)
If you are age 35 or older, your body mass index should be between 23-30. Your Body mass index is 28.49 kg/m. If this is out of the aforementioned range listed, please consider follow up with your Primary Care Provider.  If you are age 74 or younger, your body mass index should be between 19-25. Your Body mass index is 28.49 kg/m. If this is out of the aformentioned range listed, please consider follow up with your Primary Care Provider.   We have sent the following medications to your pharmacy for you to pick up at your convenience: Bentyl 10 mg every 6 hours as needed for abdominal pain  You have been scheduled for an endoscopy and colonoscopy. Please follow the written instructions given to you at your visit today. Please pick up your prep supplies at the pharmacy within the next 1-3 days. If you use inhalers (even only as needed), please bring them with you on the day of your procedure.   Start Benefiber 2 teaspoons in 8 ounces of liquid daily and may increase to twice daily if tolerated.    The [place] [facility] providers would like to encourage you to use [facility] to communicate with providers for non-urgent requests or questions.  Due to long hold times on the telephone, sending your provider a message by [facility] may be a faster and more efficient way to get a response.  Please allow 48 business hours for a response.  Please remember that this is for non-urgent requests.   It was a pleasure to see you today!  Thank you for trusting me with your gastrointestinal care!     Scott E. Tomasa Rand, MD

## 2021-02-03 NOTE — Progress Notes (Signed)
HPI : Brenda Blankenship is a very pleasant 35 year old female referred by Dr. Phill Myron for further evaluation of multiple chronic GI symptoms.  The patient states she has had issues with abdominal pain and irregular bowel habits for about 7 years now, but her symptoms have been much more bothersome in the past 3 years.   She describes frequent problems with abdominal pain, often described as a burning sensation in her abdomen frequently associated with nausea and sometimes bouts of vomiting.  She also describes having crampy abdominal pain which she compares to "contractions" which is more often experienced during bouts of constipation.  She states she will have this pain anywhere from 3 to 5 days/week.  It significantly impairs her quality of life.  She also has significant variability in her bowel habits, fluctuating between diarrhea and constipation.  When she has diarrhea, she will have 5-6 bowel movements per day with purely liquid stools.  Usually this will just last for 1 to 2 days.  When she is constipated, she will go 3 to 4 days between bowel movements.  Rarely she will have normal bowel habits without diarrhea or constipation.  She states that she has seen blood in the past, but it was "a long time ago".  She has had significant fluctuations in her weight in the past 2 years.  Patient states that she went from 126 pounds to 180 pounds, then dropped down to 140 pounds and is now 168 pounds. She did make dietary changes for about 3 months in order to help mitigate her symptoms which included eliminating fried foods, decreasing vinegar and salt intake, eliminating caffeinated beverages and alcohol and decreasing consumption of spicy food and salty foods.  Other than this, she denies significant dietary changes that would explain her weight fluctuations. She also reports a history of infrequent heartburn and acid reflux, but this usually responds to Tums and is not a significant bother for her. She  has been to the emergency room a few times for this abdominal pain and has had normal labs and normal CT scans, most recently in 2018  Past Medical History:  Diagnosis Date   Abnormal cervical Papanicolaou smear    cryo   Anemia    Anxiety    DUB (dysfunctional uterine bleeding)    Environmental allergies    HTN (hypertension)    Pregnancy induced hypertension    Preterm labor      Past Surgical History:  Procedure Laterality Date   BILATERAL SALPINGECTOMY Bilateral 04/12/2014   Procedure: BILATERAL SALPINGECTOMY;  Surgeon: Woodroe Mode, MD;  Location: [facility];  Service: Gynecology;  Laterality: Bilateral;   CRYOTHERAPY     LAPAROSCOPIC TUBAL LIGATION Bilateral 11/10/2013   Procedure: bilateral LAPAROSCOPIC TUBAL LIGATION with filshie clips;  Surgeon: Farrel Gobble. Harrington Challenger, MD;  Location: [facility];  Service: Gynecology;  Laterality: Bilateral;   VAGINAL HYSTERECTOMY N/A 04/12/2014   Procedure: HYSTERECTOMY VAGINAL;  Surgeon: Woodroe Mode, MD;  Location: [facility];  Service: Gynecology;  Laterality: N/A;   Family History  Problem Relation Age of Onset   Hypertension Mother    Clotting disorder Mother    Hypertension Father    Hyperlipidemia Father    Irritable bowel syndrome Father    Diabetes Maternal Grandmother    Esophageal cancer Maternal Grandfather    Cancer Paternal Grandmother        started in lung   Hypertension Paternal Grandmother    Prostate cancer Paternal Uncle    Social History  Tobacco Use   Smoking status: Every Day    Packs/day: 0.50    Years: 0.00    Pack years: 0.00    Types: Cigarettes   Smokeless tobacco: Blankenship   Tobacco comments:    stopped with preg  Vaping Use   Vaping Use: Blankenship used  Substance Use Topics   Alcohol use: Yes    Comment: socially 1-2 per day   Drug use: No    Types: Marijuana    Comment: stopped with preg   Current Outpatient Medications  Medication Sig Dispense Refill   acetaminophen (TYLENOL) 500 MG tablet Take 2  tablets (1,000 mg total) by mouth every 8 (eight) hours as needed. 30 tablet 0   dicyclomine (BENTYL) 10 MG capsule Take 1 capsule (10 mg total) by mouth every 6 (six) hours as needed for spasms (for abdominal pain). 120 capsule 0   gabapentin (NEURONTIN) 300 MG capsule Take 1 capsule (300 mg total) by mouth 3 (three) times daily. 90 capsule 0   hydrochlorothiazide (HYDRODIURIL) 25 MG tablet Take 1 tablet (25 mg total) by mouth daily. 30 tablet 0   ibuprofen (ADVIL) 800 MG tablet Take 1 tablet (800 mg total) by mouth every 8 (eight) hours as needed. 21 tablet 0   losartan (COZAAR) 25 MG tablet Take 1 tablet (25 mg total) by mouth daily. 30 tablet 1   Sodium Sulfate-Mag Sulfate-KCl (SUTAB) [phone] MG TABS Take 1 kit by mouth once for 1 dose. BIN: [id number] PCN: CN GROUP: [id number] MEMBER ID: [id number];HGD AS CASH;NO PRIOR AUTHORIZATION 24 tablet 0   No current facility-administered medications for this visit.   Allergies  Allergen Reactions   Hydrocodone Other (See Comments)    Pt states that this medication causes her to pass out. Pt can take oxycodone    Tape Itching   Naprosyn [Naproxen] Itching and Rash    Pt can take ibuprofen and other NSAIDs     Review of Systems: All systems reviewed and negative except where noted in HPI.    No results found.  Physical Exam: BP (!) 154/100 (BP Location: Left Arm, Patient Position: Sitting, Cuff Size: Normal)   Pulse 64   Ht 5' 3.5" (1.613 m) Comment: height measured without shoes  Wt 163 lb 6 oz (74.1 kg)   LMP 03/09/2014   BMI 28.49 kg/m  Constitutional: Pleasant,well-developed, African-American female in no acute distress. HEENT: Normocephalic and atraumatic. Conjunctivae are normal. No scleral icterus. Cardiovascular: Normal rate, regular rhythm.  Pulmonary/chest: Effort normal and breath sounds normal. No wheezing, rales or rhonchi. Abdominal: Soft, nondistended, nontender. Bowel sounds active throughout. There are no masses  palpable. No hepatomegaly. Extremities: no edema Neurological: Alert and oriented to person place and time. Skin: Skin is warm and dry. No rashes noted. Psychiatric: Normal mood and affect. Behavior is normal.  CBC    Component Value Date/Time   WBC 6.2 05/06/2019 1348   RBC 4.55 05/06/2019 1348   HGB 13.1 05/06/2019 1348   HCT 40.5 05/06/2019 1348   PLT 268 05/06/2019 1348   MCV 89.0 05/06/2019 1348   MCH 28.8 05/06/2019 1348   MCHC 32.3 05/06/2019 1348   RDW 13.4 05/06/2019 1348   LYMPHSABS 2.1 05/06/2019 1348   MONOABS 0.5 05/06/2019 1348   EOSABS 0.1 05/06/2019 1348   BASOSABS 0.0 05/06/2019 1348    CMP     Component Value Date/Time   NA 136 12/02/2020 1515   K 3.9 12/02/2020 1515   CL 99 12/02/2020 1515  CO2 21 12/02/2020 1515   GLUCOSE 96 12/02/2020 1515   GLUCOSE 89 05/06/2019 1348   BUN 8 12/02/2020 1515   CREATININE 1.01 (H) 12/02/2020 1515   CALCIUM 9.1 12/02/2020 1515   PROT 5.7 (L) 03/15/2017 1548   ALBUMIN 3.1 (L) 03/15/2017 1548   AST 16 03/15/2017 1548   ALT 10 (L) 03/15/2017 1548   ALKPHOS 42 03/15/2017 1548   BILITOT 0.3 03/15/2017 1548   GFRNONAA >60 05/06/2019 1348   GFRAA >60 05/06/2019 1348    Narrative & Impression CLINICAL DATA:  Generalized abdominal pain, nausea, vomiting and diarrhea starting last night.   EXAM: CT ABDOMEN AND PELVIS WITH CONTRAST   TECHNIQUE: Multidetector CT imaging of the abdomen and pelvis was performed using the standard protocol following bolus administration of intravenous contrast.   CONTRAST:  119m ISOVUE-300 IOPAMIDOL (ISOVUE-300) INJECTION 61%   COMPARISON:  CT abdomen dated 11/13/2013.   FINDINGS: Lower chest: No acute abnormality.   Hepatobiliary: No focal liver abnormality is seen. No gallstones, gallbladder wall thickening, or biliary dilatation.   Pancreas: Unremarkable. No pancreatic ductal dilatation or surrounding inflammatory changes.   Spleen: Normal in size without focal  abnormality.   Adrenals/Urinary Tract: The adrenal glands appear normal. Kidneys appear normal without mass, stone or hydronephrosis. No perinephric inflammation. No ureteral or bladder calculi identified. Bladder is decompressed.   Stomach/Bowel: Bowel is normal in caliber. No bowel wall thickening or evidence of bowel wall inflammation seen. Appendix is normal.   Vascular/Lymphatic: No significant vascular findings are present. No enlarged abdominal or pelvic lymph nodes.   Reproductive: Tubal ligation clips within the bilateral adnexal regions. No mass or inflammatory change within either adnexal region. Status post hysterectomy.   Other: No free fluid or abscess collection. No free intraperitoneal air.   Musculoskeletal: No acute or suspicious osseous finding. Superficial soft tissues are unremarkable.   IMPRESSION: 1. No acute findings within the abdomen or pelvis. No bowel obstruction or evidence of bowel wall inflammation. No free fluid or abscess. No evidence of acute solid organ abnormality. No renal or ureteral calculi. Appendix is normal. 2. Tubal ligation clips in the pelvis.     Electronically Signed   By: SFranki CabotM.D.   On: 10/14/2016 19:32  ASSESSMENT AND PLAN: 35year old female with multiple chronic GI symptoms to include abdominal pain, diarrhea and constipation as well as nausea and vomiting and significant unintentional weight fluctuation.  Although this history is most consistent with IBS-M, the drastic weight fluctuations in the absence of TCoralyn Markchanges is unusual.  She also gives a history of remote overt GI bleeding.  I think it is reasonable to perform an upper and lower endoscopy to rule out organic etiologies such as inflammatory bowel disease, celiac disease, microscopic colitis and mass lesion. We did discuss the pathophysiology of IBS and gut brain axis disorders and the principles of management to include dietary modifications, use of  medications to improve bowel habits and abdominal pain as well as cognitive therapies.  I recommended the patient start taking a daily fiber supplement to improve her stool bulk and consistency and start taking Bentyl as needed for her abdominal pain.  We will check an H. pylori stool antigen in addition to gastric biopsies to more completely exclude this.  Abdominal pain/altered bowel habits/nausea, vomiting - EGD/colonoscopy - Fiber supplementation - Bentyl as needed for pain - H. pylori stool antigen - Follow-up in clinic after endoscopic procedures  The details, risks (including bleeding, perforation, infection, missed lesions, medication  reactions and possible hospitalization or surgery if complications occur), benefits, and alternatives to EGD/colonoscopy with possible biopsy and possible polypectomy were discussed with the patient and she consents to proceed.    Brenda Blankenship*

## 2021-02-18 ENCOUNTER — Other Ambulatory Visit: Payer: Medicaid Other

## 2021-02-18 DIAGNOSIS — R1084 Generalized abdominal pain: Secondary | ICD-10-CM

## 2021-02-20 LAB — H. PYLORI ANTIGEN, STOOL: H pylori Ag, Stl: NEGATIVE

## 2021-02-20 NOTE — Progress Notes (Signed)
Ms. Bayon, your H. Pylori stool test was negative.  We'll see you next week for your procedures.

## 2021-02-24 ENCOUNTER — Ambulatory Visit (AMBULATORY_SURGERY_CENTER): Payer: Medicaid Other | Admitting: Gastroenterology

## 2021-02-24 ENCOUNTER — Encounter: Payer: Self-pay | Admitting: Gastroenterology

## 2021-02-24 ENCOUNTER — Other Ambulatory Visit: Payer: Self-pay

## 2021-02-24 VITALS — BP 110/78 | HR 51 | Temp 97.3°F | Resp 9 | Ht 63.5 in | Wt 163.0 lb

## 2021-02-24 DIAGNOSIS — R1084 Generalized abdominal pain: Secondary | ICD-10-CM

## 2021-02-24 DIAGNOSIS — K319 Disease of stomach and duodenum, unspecified: Secondary | ICD-10-CM

## 2021-02-24 DIAGNOSIS — Z419 Encounter for procedure for purposes other than remedying health state, unspecified: Secondary | ICD-10-CM | POA: Diagnosis not present

## 2021-02-24 DIAGNOSIS — R110 Nausea: Secondary | ICD-10-CM | POA: Diagnosis not present

## 2021-02-24 DIAGNOSIS — K259 Gastric ulcer, unspecified as acute or chronic, without hemorrhage or perforation: Secondary | ICD-10-CM | POA: Diagnosis not present

## 2021-02-24 DIAGNOSIS — R197 Diarrhea, unspecified: Secondary | ICD-10-CM

## 2021-02-24 DIAGNOSIS — R634 Abnormal weight loss: Secondary | ICD-10-CM

## 2021-02-24 MED ORDER — SODIUM CHLORIDE 0.9 % IV SOLN: 500.0000 mL | INTRAVENOUS | Status: DC

## 2021-02-24 NOTE — Op Note (Signed)
[facility] Patient Name: Brenda Blankenship Procedure Date: 02/24/2021 7:52 AM MRN: [id number] Endoscopist: Lorin Picket E. Tomasa Rand , MD Age: 35 Referring MD:  Date of Birth: 04-08-1986 Gender: Female Account #: [id number] Procedure:                Colonoscopy Indications:              Generalized abdominal pain Medicines:                Monitored Anesthesia Care Procedure:                Pre-Anesthesia Assessment:                           - Prior to the procedure, a History and Physical                            was performed, and patient medications and                            allergies were reviewed. The patient's tolerance of                            previous anesthesia was also reviewed. The risks                            and benefits of the procedure and the sedation                            options and risks were discussed with the patient.                            All questions were answered, and informed consent                            was obtained. Prior Anticoagulants: The patient has                            taken no previous anticoagulant or antiplatelet                            agents. ASA Grade Assessment: II - A patient with                            mild systemic disease. After reviewing the risks                            and benefits, the patient was deemed in                            satisfactory condition to undergo the procedure.                           After obtaining informed consent, the colonoscope  was passed under direct vision. Throughout the                            procedure, the patient's blood pressure, pulse, and                            oxygen saturations were monitored continuously. The                            CF [id number] #[id number] was introduced through the anus                            and advanced to the the terminal ileum, with                            identification of the appendiceal  orifice and IC                            valve. The colonoscopy was performed without                            difficulty. The patient tolerated the procedure                            well. The quality of the bowel preparation was                            good. The terminal ileum, ileocecal valve,                            appendiceal orifice, and rectum were photographed. Scope In: 8:11:01 AM Scope Out: 8:23:44 AM Scope Withdrawal Time: 0 hours 7 minutes 34 seconds  Total Procedure Duration: 0 hours 12 minutes 43 seconds  Findings:                 The perianal and digital rectal examinations were                            normal. Pertinent negatives include normal                            sphincter tone and no palpable rectal lesions.                           The colon (entire examined portion) appeared normal.                           The terminal ileum appeared normal.                           The retroflexed view of the distal rectum and anal                            verge was normal and showed no anal or rectal  abnormalities. Complications:            No immediate complications. Estimated Blood Loss:     Estimated blood loss: none. Impression:               - The entire examined colon is normal.                           - The examined portion of the ileum was normal.                           - The distal rectum and anal verge are normal on                            retroflexion view.                           - No specimens collected.                           - No abnormalities to explain patient's GI                            symptoms. History is consistent with IBS. Recommendation:           - Patient has a contact number available for                            emergencies. The signs and symptoms of potential                            delayed complications were discussed with the                            patient. Return to  normal activities tomorrow.                            Written discharge instructions were provided to the                            patient.                           - Resume previous diet.                           - Continue present medications.                           - Repeat colonoscopy in 10 years for screening                            purposes.                           - Follow up with Dr. Tomasa Randunningham in the GI clinic  for ongoing management of IBS. Brenda Blankenship E. Tomasa Rand, MD 02/24/2021 8:34:27 AM This report has been signed electronically.

## 2021-02-24 NOTE — Patient Instructions (Signed)
Handout provided on gastritis.   Repeat colonoscopy in 10 years for screening purposes.   YOU HAD AN ENDOSCOPIC PROCEDURE TODAY AT THE [facility]:   Refer to the procedure report that was given to you for any specific questions about what was found during the examination.  If the procedure report does not answer your questions, please call your gastroenterologist to clarify.  If you requested that your care partner not be given the details of your procedure findings, then the procedure report has been included in a sealed envelope for you to review at your convenience later.  YOU SHOULD EXPECT: Some feelings of bloating in the abdomen. Passage of more gas than usual.  Walking can help get rid of the air that was put into your GI tract during the procedure and reduce the bloating. If you had a lower endoscopy (such as a colonoscopy or flexible sigmoidoscopy) you may notice spotting of blood in your stool or on the toilet paper. If you underwent a bowel prep for your procedure, you may not have a normal bowel movement for a few days.  Please Note:  You might notice some irritation and congestion in your nose or some drainage.  This is from the oxygen used during your procedure.  There is no need for concern and it should clear up in a day or so.  SYMPTOMS TO REPORT IMMEDIATELY:  Following lower endoscopy (colonoscopy or flexible sigmoidoscopy):  Excessive amounts of blood in the stool  Significant tenderness or worsening of abdominal pains  Swelling of the abdomen that is new, acute  Fever of 100F or higher  Following upper endoscopy (EGD)  Vomiting of blood or coffee ground material  New chest pain or pain under the shoulder blades  Painful or persistently difficult swallowing  New shortness of breath  Fever of 100F or higher  Black, tarry-looking stools  For urgent or emergent issues, a gastroenterologist can be reached at any hour by calling [phone]. Do not use  MyChart messaging for urgent concerns.    DIET:  We do recommend a small meal at first, but then you may proceed to your regular diet.  Drink plenty of fluids but you should avoid alcoholic beverages for 24 hours.  ACTIVITY:  You should plan to take it easy for the rest of today and you should NOT DRIVE or use heavy machinery until tomorrow (because of the sedation medicines used during the test).    FOLLOW UP: Our staff will call the number listed on your records 48-72 hours following your procedure to check on you and address any questions or concerns that you may have regarding the information given to you following your procedure. If we do not reach you, we will leave a message.  We will attempt to reach you two times.  During this call, we will ask if you have developed any symptoms of COVID 19. If you develop any symptoms (ie: fever, flu-like symptoms, shortness of breath, cough etc.) before then, please call [phone].  If you test positive for Covid 19 in the 2 weeks post procedure, please call and report this information to [place].    If any biopsies were taken you will be contacted by phone or by letter within the next 1-3 weeks.  Please call us at [phone] if you have not heard about the biopsies in 3 weeks.    SIGNATURES/CONFIDENTIALITY: You and/or your care partner have signed paperwork which will be entered into your electronic medical  record.  These signatures attest to the fact that that the information above on your After Visit Summary has been reviewed and is understood.  Full responsibility of the confidentiality of this discharge information lies with you and/or your care-partner.

## 2021-02-24 NOTE — Progress Notes (Signed)
PT taken to PACU. Monitors in place. VSS. Report given to RN. 

## 2021-02-24 NOTE — Progress Notes (Signed)
Called to room to assist during endoscopic procedure.  Patient ID and intended procedure confirmed with present staff. Received instructions for my participation in the procedure from the performing physician.  

## 2021-02-24 NOTE — Op Note (Signed)
[facility] Patient Name: Brenda Blankenship Procedure Date: 02/24/2021 7:52 AM MRN: [id number] Endoscopist: Lorin Picket E. Tomasa Rand , MD Age: 35 Referring MD:  Date of Birth: 26-Mar-1986 Gender: Female Account #: [id number] Procedure:                Upper GI endoscopy Indications:              Generalized abdominal pain, Nausea with vomiting Medicines:                Monitored Anesthesia Care Procedure:                Pre-Anesthesia Assessment:                           - Prior to the procedure, a History and Physical                            was performed, and patient medications and                            allergies were reviewed. The patient's tolerance of                            previous anesthesia was also reviewed. The risks                            and benefits of the procedure and the sedation                            options and risks were discussed with the patient.                            All questions were answered, and informed consent                            was obtained. Prior Anticoagulants: The patient has                            taken no previous anticoagulant or antiplatelet                            agents. ASA Grade Assessment: II - A patient with                            mild systemic disease. After reviewing the risks                            and benefits, the patient was deemed in                            satisfactory condition to undergo the procedure.                           After obtaining informed consent, the endoscope was  passed under direct vision. Throughout the                            procedure, the patient's blood pressure, pulse, and                            oxygen saturations were monitored continuously. The                            GIF [id number] #[id number] was introduced through the                            mouth, and advanced to the third part of duodenum.                            The  upper GI endoscopy was accomplished without                            difficulty. The patient tolerated the procedure                            well. Scope In: Scope Out: Findings:                 The examined esophagus was normal.                           Patchy moderately erythematous mucosa without                            bleeding was found in the gastric antrum. Biopsies                            were taken with a cold forceps for Helicobacter                            pylori testing. Estimated blood loss was minimal.                           The exam of the stomach was otherwise normal.                           The examined duodenum was normal. Complications:            No immediate complications. Estimated Blood Loss:     Estimated blood loss was minimal. Impression:               - Normal esophagus.                           - Erythematous mucosa in the antrum. Biopsied.                           - Normal examined duodenum. Recommendation:           - Patient has a contact number available for  emergencies. The signs and symptoms of potential                            delayed complications were discussed with the                            patient. Return to normal activities tomorrow.                            Written discharge instructions were provided to the                            patient.                           - Resume previous diet.                           - Continue present medications.                           - Await pathology results.                           - Follow up as needed with Dr. Tomasa Rand in the GI                            clinic. Karinda Cabriales E. Tomasa Rand, MD 02/24/2021 8:29:23 AM This report has been signed electronically.

## 2021-02-24 NOTE — Progress Notes (Signed)
Pt's states no medical or surgical changes since previsit or office visit. 

## 2021-02-26 ENCOUNTER — Telehealth: Payer: Self-pay | Admitting: *Deleted

## 2021-02-26 NOTE — Telephone Encounter (Signed)
First follow up call attempt.  LVM. 

## 2021-02-26 NOTE — Telephone Encounter (Signed)
  Follow up Call-  Call back number 02/24/2021  Post procedure Call Back phone  # [phone]  Permission to leave phone message Yes  Some recent data might be hidden     No answer at 2nd attempt follow up phone call.  Left message on voicemail.

## 2021-03-03 ENCOUNTER — Telehealth (HOSPITAL_BASED_OUTPATIENT_CLINIC_OR_DEPARTMENT_OTHER): Payer: Medicaid Other | Admitting: Internal Medicine

## 2021-03-03 ENCOUNTER — Telehealth: Payer: Self-pay | Admitting: Internal Medicine

## 2021-03-03 DIAGNOSIS — U071 COVID-19: Secondary | ICD-10-CM | POA: Diagnosis not present

## 2021-03-03 MED ORDER — NIRMATRELVIR/RITONAVIR (PAXLOVID)TABLET: 3.0000 | ORAL_TABLET | Freq: Two times a day (BID) | ORAL | 0 refills | Status: AC

## 2021-03-03 NOTE — Telephone Encounter (Signed)
Pt will need a virtual appt with any available clinician

## 2021-03-03 NOTE — Progress Notes (Signed)
Virtual Visit via Telephone Note We try to do this initially as a video visit.  However I was unable to hear the patient after connecting so we converted to a telephone visit. I connected with Brenda Blankenship on 03/03/2021 at 2:52 PM by telephone and verified that I am speaking with the correct person using two identifiers  Location: Patient: home Provider: office  Participants: Myself Patient I discussed the limitations, risks, security and privacy concerns of performing an evaluation and management service by telephone and the availability of in person appointments. I also discussed with the patient that there may be a patient responsible charge related to this service. The patient expressed understanding and agreed to proceed.   History of Present Illness: Pt with hx of HTN, tob dep, DUB.  This is an UC visit.  Pt tested positive at [facility] for COVID yesterday. Symptoms started 2 days ago and include fatigue, body aches, sore throat, congestion, coughing and rhinorrhea.  No fever or SOB.  Taking Tylenol Q 8 hrs Has had 2 shots of COVID vaccine, thinks it was Moderna.  Last shot 02/2020. Lives with husband, mom and her 2 kids. She started quarantining in one room by herself since yesterday.  Other members tested negative yesterday Pt has had hysterectomy.  She does not work. Outpatient Encounter Medications as of 03/03/2021  Medication Sig   acetaminophen (TYLENOL) 500 MG tablet Take 2 tablets (1,000 mg total) by mouth every 8 (eight) hours as needed.   dicyclomine (BENTYL) 10 MG capsule Take 1 capsule (10 mg total) by mouth every 6 (six) hours as needed for spasms (for abdominal pain).   gabapentin (NEURONTIN) 300 MG capsule Take 1 capsule (300 mg total) by mouth 3 (three) times daily.   hydrochlorothiazide (HYDRODIURIL) 25 MG tablet Take 1 tablet (25 mg total) by mouth daily.   ibuprofen (ADVIL) 800 MG tablet Take 1 tablet (800 mg total) by mouth every 8 (eight) hours as needed.    losartan (COZAAR) 25 MG tablet Take 1 tablet (25 mg total) by mouth daily.   [DISCONTINUED] omeprazole (PRILOSEC) 20 MG capsule Take 1 capsule (20 mg total) by mouth daily. (Patient not taking: Reported on 02/09/2019)   No facility-administered encounter medications on file as of 03/03/2021.      Observations/Objective: No direct observation done as this was a telephone encounter. Patient was talking in full sentences.  She did not sound short of breath.  Assessment and Plan: 1. COVID-19 virus infection -Patient with mild symptoms.  I discussed with her the 2 antiviral oral medications that are out there to help shorten the course of symptoms and prevent hospitalization.  She is willing to take Paxlovid.  I went over possible side effects including diarrhea, altered taste and myalgia. I went over recommendations for quarantine as outlined below. Criteria for self-isolation if you test positive for COVID-19, regardless of vaccination status:  -If you have mild symptoms that are resolving or have resolved, isolate at home for 5 days since symptoms started AND continue to wear a well-fitted mask when around others in the home and in public for 5 additional days after isolation is completed. -If you have a fever and/or moderate to severe symptoms, isolate for at least 10 days since the symptoms started AND until you are fever free for at least 24 hours without the use of fever-reducing medications. -If you tested positive and did not have symptoms, isolate for at least 5 days after your positive test.   Use over-the-counter  medications for symptoms. If you develop respiratory issues/distress, seek medical care in the Emergency Department.  If you must leave home or if you have to be around others please wear a mask. Please limit contact with immediate family members in the home, practice social distancing, frequent handwashing and clean hard surfaces touched frequently with household cleaning products.  Members of your household will also need to quarantine andtest. You may also be contacted by the health department for follow up.  - nirmatrelvir/ritonavir EUA (PAXLOVID) TABS; Take 3 tablets by mouth 2 (two) times daily for 5 days. (Take nirmatrelvir 150 mg two tablets twice daily for 5 days and ritonavir 100 mg one tablet twice daily for 5 days) Patient GFR is 75  Dispense: 30 tablet; Refill: 0   Follow Up Instructions: PRN   I discussed the assessment and treatment plan with the patient. The patient was provided an opportunity to ask questions and all were answered. The patient agreed with the plan and demonstrated an understanding of the instructions.   The patient was advised to call back or seek an in-person evaluation if the symptoms worsen or if the condition fails to improve as anticipated.  I  Spent 12 minutes on this telephone encounter  Jonah Blue, MD

## 2021-03-03 NOTE — Telephone Encounter (Signed)
Patient stated she tested COVID+ on 03/02/2021. The pharmacy told her to contact her PCP. Patient states she would like to know what the next steps are, she states she has no idea what she is supposed to be doing. Patient is also asking for a medication if possible. Please call patient and advise.

## 2021-03-04 ENCOUNTER — Encounter: Payer: Self-pay | Admitting: Gastroenterology

## 2021-03-20 DIAGNOSIS — M25532 Pain in left wrist: Secondary | ICD-10-CM | POA: Diagnosis not present

## 2021-03-24 DIAGNOSIS — M2550 Pain in unspecified joint: Secondary | ICD-10-CM | POA: Diagnosis not present

## 2021-03-27 DIAGNOSIS — Z419 Encounter for procedure for purposes other than remedying health state, unspecified: Secondary | ICD-10-CM | POA: Diagnosis not present

## 2021-04-24 NOTE — Progress Notes (Signed)
Patient ID: BRYTNEY SOMES, female    DOB: [birth year]  MRN: [id number]  CC: Hypertension Follow-Up   Subjective: Enyah Moman is a 35 y.o. female who presents for hypertensin follow-up.   Her concerns today include:  HYPERTENSION FOLLOW-UP: 12/02/2020 per DO note: Discussed with patient that BP is above goal. Advised that as BP becomes more optimally controlled, can sometimes feel like having hypotension. Continue HCTZ. Add Losartan. Lab monitoring with medication change.    04/28/2021: Reports ran out of Hydrochlorothiazide 2 weeks ago. Still taking Losartan. Reports home blood pressures are 140's-150's/90's-(100's on one occasion). Watching salt in the diet. No exercise outside of activities of daily living. Smoking 6 cigarettes daily and did smoke a few hours before today's appointment. Denies shortness of breath and chest pain.   2. SLEEPING CONCERNS: Began early 2022. Initially thought stress-related. Since then stress has decreased but sleep concerns have persisted. When awake at nighttime watching tv, reading, or playing games. Has not tried any over-the-counter medications.     Patient Active Problem List   Diagnosis Date Noted   Postoperative state 04/12/2014   DUB (dysfunctional uterine bleeding) 03/08/2014   Dysmenorrhea 03/08/2014     Current Outpatient Medications on File Prior to Visit  Medication Sig Dispense Refill   acetaminophen (TYLENOL) 500 MG tablet Take 2 tablets (1,000 mg total) by mouth every 8 (eight) hours as needed. 30 tablet 0   dicyclomine (BENTYL) 10 MG capsule Take 1 capsule (10 mg total) by mouth every 6 (six) hours as needed for spasms (for abdominal pain). 120 capsule 0   gabapentin (NEURONTIN) 300 MG capsule Take 1 capsule (300 mg total) by mouth 3 (three) times daily. 90 capsule 0   hydroxychloroquine (PLAQUENIL) 200 MG tablet Take 200 mg by mouth 2 (two) times daily.     ibuprofen (ADVIL) 800 MG tablet Take 1 tablet (800 mg total) by mouth every 8  (eight) hours as needed. 21 tablet 0   [DISCONTINUED] omeprazole (PRILOSEC) 20 MG capsule Take 1 capsule (20 mg total) by mouth daily. (Patient not taking: Reported on 02/09/2019) 30 capsule 0   No current facility-administered medications on file prior to visit.    Allergies  Allergen Reactions   Hydrocodone Other (See Comments)    Pt states that this medication causes her to pass out. Pt can take oxycodone    Tape Itching   Naprosyn [Naproxen] Itching and Rash    Pt can take ibuprofen and other NSAIDs    Social History   Socioeconomic History   Marital status: Legally Separated    Spouse name: Not on file   Number of children: 2   Years of education: Not on file   Highest education level: Not on file  Occupational History   Not on file  Tobacco Use   Smoking status: Every Day    Packs/day: 0.50    Years: 0.00    Pack years: 0.00    Types: Cigarettes   Smokeless tobacco: Never   Tobacco comments:    stopped with preg  Vaping Use   Vaping Use: Never used  Substance and Sexual Activity   Alcohol use: Yes    Comment: socially 1-2 per day   Drug use: No    Types: Marijuana    Comment: stopped with preg   Sexual activity: Yes    Birth control/protection: Surgical, Condom  Other Topics Concern   Not on file  Social History Narrative   Not on file  Social Determinants of Health   Financial Resource Strain: Not on file  Food Insecurity: Not on file  Transportation Needs: Not on file  Physical Activity: Not on file  Stress: Not on file  Social Connections: Not on file  Intimate Partner Violence: Not on file    Family History  Problem Relation Age of Onset   Hypertension Mother    Clotting disorder Mother    Hypertension Father    Hyperlipidemia Father    Irritable bowel syndrome Father    Diabetes Maternal Grandmother    Esophageal cancer Maternal Grandfather    Cancer Paternal Grandmother        started in lung   Hypertension Paternal Grandmother     Prostate cancer Paternal Uncle     Past Surgical History:  Procedure Laterality Date   BILATERAL SALPINGECTOMY Bilateral 04/12/2014   Procedure: BILATERAL SALPINGECTOMY;  Surgeon: Adam Phenix, MD;  Location: [facility];  Service: Gynecology;  Laterality: Bilateral;   CRYOTHERAPY     LAPAROSCOPIC TUBAL LIGATION Bilateral 11/10/2013   Procedure: bilateral LAPAROSCOPIC TUBAL LIGATION with filshie clips;  Surgeon: Freddrick March. Tenny Craw, MD;  Location: [facility];  Service: Gynecology;  Laterality: Bilateral;   VAGINAL HYSTERECTOMY N/A 04/12/2014   Procedure: HYSTERECTOMY VAGINAL;  Surgeon: Adam Phenix, MD;  Location: [facility];  Service: Gynecology;  Laterality: N/A;    ROS: Review of Systems Negative except as stated above  PHYSICAL EXAM: BP (!) 154/94 (BP Location: Left Arm, Patient Position: Sitting, Cuff Size: Normal)   Pulse 60   Temp 98.3 F (36.8 C)   Resp 16   Ht 5' 3.5" (1.613 m)   Wt 168 lb 12.8 oz (76.6 kg)   LMP 03/09/2014   SpO2 99%   BMI 29.43 kg/m   Physical Exam HENT:     Head: Normocephalic and atraumatic.  Eyes:     Extraocular Movements: Extraocular movements intact.     Conjunctiva/sclera: Conjunctivae normal.     Pupils: Pupils are equal, round, and reactive to light.  Cardiovascular:     Rate and Rhythm: Regular rhythm. Tachycardia present.     Pulses: Normal pulses.     Heart sounds: Normal heart sounds.  Pulmonary:     Effort: Pulmonary effort is normal.     Breath sounds: Normal breath sounds.  Musculoskeletal:     Cervical back: Normal range of motion and neck supple.  Neurological:     General: No focal deficit present.     Mental Status: She is alert and oriented to person, place, and time.  Psychiatric:        Mood and Affect: Mood normal.        Behavior: Behavior normal.   ASSESSMENT AND PLAN: 1. Essential hypertension: - Blood pressure not at goal during today's visit. Patient asymptomatic without chest pressure, chest pain, palpitations,  shortness of breath, worst headache of life, and any additional red flag symptoms. - Increase Hydrochlorothiazide from 25 mg daily to 50 mg daily.  - Continue Losartan as prescribed.  - Counseled on blood pressure goal of less than 130/80, low-sodium, DASH diet, medication compliance, 150 minutes of moderate intensity exercise per week as tolerated. Discussed medication compliance, adverse effects. - BMP to evaluate kidney function and electrolyte balance. - Follow-up with primary provider in 2 weeks or sooner if needed.  - Basic Metabolic Panel - hydrochlorothiazide (HYDRODIURIL) 50 MG tablet; Take 1 tablet (50 mg total) by mouth daily.  Dispense: 60 tablet; Refill: 0 - losartan (COZAAR) 25  MG tablet; Take 1 tablet (25 mg total) by mouth daily.  Dispense: 90 tablet; Refill: 0  2. Insomnia, unspecified type: - Begin Trazodone as prescribed. Discussed medication compliance, adverse effects. May cause drowsiness. Counseled patient to not consume if operating heavy machinery or driving. Counseled patient to not consume with alcohol or illicit substances. Patient verbalized understanding.  - Follow-up with primary provider in 4 weeks or sooner if needed.  - traZODone (DESYREL) 50 MG tablet; Take 1 tablet (50 mg total) by mouth at bedtime as needed for sleep.  Dispense: 30 tablet; Refill: 1   Patient was given the opportunity to ask questions.  Patient verbalized understanding of the plan and was able to repeat key elements of the plan. Patient was given clear instructions to go to Emergency Department or return to medical center if symptoms don't improve, worsen, or new problems develop.The patient verbalized understanding.   Requested Prescriptions   Signed Prescriptions Disp Refills   hydrochlorothiazide (HYDRODIURIL) 50 MG tablet 60 tablet 0    Sig: Take 1 tablet (50 mg total) by mouth daily.   losartan (COZAAR) 25 MG tablet 90 tablet 0    Sig: Take 1 tablet (25 mg total) by mouth daily.    traZODone (DESYREL) 50 MG tablet 30 tablet 1    Sig: Take 1 tablet (50 mg total) by mouth at bedtime as needed for sleep.    Return in about 2 weeks (around 05/12/2021) for Follow-Up or next available hypertension and 4 weeks insomnia with Georganna Skeans, MD.  Rema Fendt, NP

## 2021-04-26 DIAGNOSIS — Z419 Encounter for procedure for purposes other than remedying health state, unspecified: Secondary | ICD-10-CM | POA: Diagnosis not present

## 2021-04-28 ENCOUNTER — Encounter: Payer: Self-pay | Admitting: Family

## 2021-04-28 ENCOUNTER — Other Ambulatory Visit: Payer: Self-pay

## 2021-04-28 ENCOUNTER — Ambulatory Visit (INDEPENDENT_AMBULATORY_CARE_PROVIDER_SITE_OTHER): Payer: Medicaid Other | Admitting: Family

## 2021-04-28 VITALS — BP 154/94 | HR 60 | Temp 98.3°F | Resp 16 | Ht 63.5 in | Wt 168.8 lb

## 2021-04-28 DIAGNOSIS — G4700 Insomnia, unspecified: Secondary | ICD-10-CM | POA: Diagnosis not present

## 2021-04-28 DIAGNOSIS — I10 Essential (primary) hypertension: Secondary | ICD-10-CM

## 2021-04-28 MED ORDER — LOSARTAN POTASSIUM 25 MG PO TABS: 25.0000 mg | ORAL_TABLET | Freq: Every day | ORAL | 0 refills | Status: DC

## 2021-04-28 MED ORDER — HYDROCHLOROTHIAZIDE 50 MG PO TABS: 50.0000 mg | ORAL_TABLET | Freq: Every day | ORAL | 0 refills | Status: DC

## 2021-04-28 MED ORDER — TRAZODONE HCL 50 MG PO TABS: 50.0000 mg | ORAL_TABLET | Freq: Every evening | ORAL | 1 refills | Status: DC | PRN

## 2021-04-28 NOTE — Progress Notes (Signed)
Pt presents for hypertension follow-up,  pt reports insomnia  Needs refill on HCTZ- has not taken in 2 weeks

## 2021-04-29 LAB — BASIC METABOLIC PANEL
BUN/Creatinine Ratio: 9 (ref 9–23)
BUN: 9 mg/dL (ref 6–20)
CO2: 23 mmol/L (ref 20–29)
Calcium: 9.2 mg/dL (ref 8.7–10.2)
Chloride: 100 mmol/L (ref 96–106)
Creatinine, Ser: 1.03 mg/dL — ABNORMAL HIGH (ref 0.57–1.00)
Glucose: 83 mg/dL (ref 70–99)
Potassium: 4.6 mmol/L (ref 3.5–5.2)
Sodium: 136 mmol/L (ref 134–144)
eGFR: 73 mL/min/{1.73_m2} (ref >59)

## 2021-04-29 NOTE — Progress Notes (Signed)
Kidney function normal. Reminder to remain hydrated with plenty of water.

## 2021-05-13 ENCOUNTER — Emergency Department (HOSPITAL_BASED_OUTPATIENT_CLINIC_OR_DEPARTMENT_OTHER): Payer: Medicaid Other

## 2021-05-13 ENCOUNTER — Encounter (HOSPITAL_BASED_OUTPATIENT_CLINIC_OR_DEPARTMENT_OTHER): Payer: Self-pay | Admitting: Emergency Medicine

## 2021-05-13 ENCOUNTER — Emergency Department (HOSPITAL_BASED_OUTPATIENT_CLINIC_OR_DEPARTMENT_OTHER)
Admission: EM | Admit: 2021-05-13 | Discharge: 2021-05-13 | Disposition: A | Discharge status: Home / Self Care | Payer: Medicaid Other | Attending: Emergency Medicine | Admitting: Emergency Medicine

## 2021-05-13 ENCOUNTER — Telehealth: Payer: Medicaid Other | Admitting: Physician Assistant

## 2021-05-13 ENCOUNTER — Other Ambulatory Visit: Payer: Self-pay

## 2021-05-13 DIAGNOSIS — F17210 Nicotine dependence, cigarettes, uncomplicated: Secondary | ICD-10-CM | POA: Insufficient documentation

## 2021-05-13 DIAGNOSIS — Z20822 Contact with and (suspected) exposure to covid-19: Secondary | ICD-10-CM | POA: Diagnosis not present

## 2021-05-13 DIAGNOSIS — H66002 Acute suppurative otitis media without spontaneous rupture of ear drum, left ear: Secondary | ICD-10-CM | POA: Diagnosis not present

## 2021-05-13 DIAGNOSIS — J069 Acute upper respiratory infection, unspecified: Secondary | ICD-10-CM | POA: Insufficient documentation

## 2021-05-13 DIAGNOSIS — Z79899 Other long term (current) drug therapy: Secondary | ICD-10-CM | POA: Insufficient documentation

## 2021-05-13 DIAGNOSIS — I10 Essential (primary) hypertension: Secondary | ICD-10-CM | POA: Insufficient documentation

## 2021-05-13 DIAGNOSIS — R059 Cough, unspecified: Secondary | ICD-10-CM | POA: Diagnosis not present

## 2021-05-13 DIAGNOSIS — R0981 Nasal congestion: Secondary | ICD-10-CM | POA: Diagnosis present

## 2021-05-13 DIAGNOSIS — R053 Chronic cough: Secondary | ICD-10-CM | POA: Diagnosis not present

## 2021-05-13 LAB — RESP PANEL BY RT-PCR (FLU A&B, COVID) ARPGX2
Influenza A by PCR: NEGATIVE
Influenza B by PCR: NEGATIVE
SARS Coronavirus 2 by RT PCR: NEGATIVE

## 2021-05-13 MED ORDER — AMOXICILLIN-POT CLAVULANATE 875-125 MG PO TABS: 1.0000 | ORAL_TABLET | Freq: Two times a day (BID) | ORAL | 0 refills | Status: AC

## 2021-05-13 MED ORDER — BENZONATATE 100 MG PO CAPS: 100.0000 mg | ORAL_CAPSULE | Freq: Three times a day (TID) | ORAL | 0 refills | Status: DC

## 2021-05-13 MED ORDER — IPRATROPIUM-ALBUTEROL 0.5-2.5 (3) MG/3ML IN SOLN
3.0000 mL | Freq: Once | RESPIRATORY_TRACT | Status: AC
  Administered 2021-05-13: 3 mL via RESPIRATORY_TRACT
  Filled 2021-05-13: qty 3

## 2021-05-13 NOTE — ED Provider Notes (Signed)
[facility] EMERGENCY DEPT Provider Note   CSN: [id number] Arrival date & time: 05/13/21  1836     History Chief Complaint  Patient presents with   Nasal Congestion    Brenda Blankenship is a 35 y.o. female.  HPI     2 weeks of cough congestion 101 fever last Thursday, none since then  Dry cough for 2 weeks Congestion, green sputum Feeling of fullness right angle of jaw and ear itchiness No n/v/d Daughter here sick and her classmates and teacher sick as well    Past Medical History:  Diagnosis Date   Abnormal cervical Papanicolaou smear    cryo   Anemia    Anxiety    DUB (dysfunctional uterine bleeding)    Environmental allergies    HTN (hypertension)    Pregnancy induced hypertension    Preterm labor     Patient Active Problem List   Diagnosis Date Noted   Postoperative state 04/12/2014   DUB (dysfunctional uterine bleeding) 03/08/2014   Dysmenorrhea 03/08/2014    Past Surgical History:  Procedure Laterality Date   BILATERAL SALPINGECTOMY Bilateral 04/12/2014   Procedure: BILATERAL SALPINGECTOMY;  Surgeon: Adam Phenix, MD;  Location: [facility];  Service: Gynecology;  Laterality: Bilateral;   CRYOTHERAPY     LAPAROSCOPIC TUBAL LIGATION Bilateral 11/10/2013   Procedure: bilateral LAPAROSCOPIC TUBAL LIGATION with filshie clips;  Surgeon: Freddrick March. Tenny Craw, MD;  Location: [facility];  Service: Gynecology;  Laterality: Bilateral;   VAGINAL HYSTERECTOMY N/A 04/12/2014   Procedure: HYSTERECTOMY VAGINAL;  Surgeon: Adam Phenix, MD;  Location: [facility];  Service: Gynecology;  Laterality: N/A;     OB History     Gravida  3   Para  2   Term  2   Preterm  0   AB  1   Living  2      SAB  0   IAB  1   Ectopic  0   Multiple  0   Live Births  2           Family History  Problem Relation Age of Onset   Hypertension Mother    Clotting disorder Mother    Hypertension Father    Hyperlipidemia Father    Irritable bowel syndrome Father     Diabetes Maternal Grandmother    Esophageal cancer Maternal Grandfather    Cancer Paternal Grandmother        started in lung   Hypertension Paternal Grandmother    Prostate cancer Paternal Uncle     Social History   Tobacco Use   Smoking status: Every Day    Packs/day: 0.50    Years: 0.00    Pack years: 0.00    Types: Cigarettes   Smokeless tobacco: Never   Tobacco comments:    stopped with preg  Vaping Use   Vaping Use: Never used  Substance Use Topics   Alcohol use: Yes    Comment: socially 1-2 per day   Drug use: No    Types: Marijuana    Comment: stopped with preg    Home Medications Prior to Admission medications   Medication Sig Start Date End Date Taking? Authorizing Provider  amoxicillin-clavulanate (AUGMENTIN) 875-125 MG tablet Take 1 tablet by mouth every 12 (twelve) hours for 10 days. 05/13/21 05/23/21 Yes Alvira Monday, MD  benzonatate (TESSALON) 100 MG capsule Take 1 capsule (100 mg total) by mouth every 8 (eight) hours. 05/13/21  Yes Alvira Monday, MD  acetaminophen (TYLENOL) 500 MG  tablet Take 2 tablets (1,000 mg total) by mouth every 8 (eight) hours as needed. 09/12/19   Darr, Gerilyn Pilgrim, PA-C  dicyclomine (BENTYL) 10 MG capsule Take 1 capsule (10 mg total) by mouth every 6 (six) hours as needed for spasms (for abdominal pain). 02/03/21 03/05/21  Jenel Lucks, MD  gabapentin (NEURONTIN) 300 MG capsule Take 1 capsule (300 mg total) by mouth 3 (three) times daily. 12/02/20   Arvilla Market, MD  hydrochlorothiazide (HYDRODIURIL) 50 MG tablet Take 1 tablet (50 mg total) by mouth daily. 04/28/21 06/27/21  Rema Fendt, NP  hydroxychloroquine (PLAQUENIL) 200 MG tablet Take 200 mg by mouth 2 (two) times daily. 04/01/21   [provider]  ibuprofen (ADVIL) 800 MG tablet Take 1 tablet (800 mg total) by mouth every 8 (eight) hours as needed. 08/20/20   Mickie Bail, NP  losartan (COZAAR) 25 MG tablet Take 1 tablet (25 mg total) by mouth daily.  04/28/21 07/27/21  Rema Fendt, NP  traZODone (DESYREL) 50 MG tablet Take 1 tablet (50 mg total) by mouth at bedtime as needed for sleep. 04/28/21 06/27/21  Rema Fendt, NP  omeprazole (PRILOSEC) 20 MG capsule Take 1 capsule (20 mg total) by mouth daily. Patient not taking: Reported on 02/09/2019 01/25/17 02/09/19  Muthersbaugh, Dahlia Client, PA-C    Allergies    Hydrocodone, Tape, and Naprosyn [naproxen]  Review of Systems   Review of Systems  Constitutional:  Negative for fever (5 days earlier).  HENT:  Positive for congestion. Negative for sore throat.   Eyes:  Negative for visual disturbance.  Respiratory:  Positive for cough. Negative for shortness of breath.   Cardiovascular:  Negative for chest pain.  Gastrointestinal:  Negative for abdominal pain, nausea and vomiting.  Genitourinary:  Negative for difficulty urinating.  Musculoskeletal:  Negative for neck pain.  Skin:  Negative for rash.  Neurological:  Negative for syncope and headaches.   Physical Exam Updated Vital Signs BP 131/81 (BP Location: Right Arm)   Pulse 81   Temp 98.1 F (36.7 C) (Oral)   Resp 18   Ht 5\' 3"  (1.6 m)   Wt 74.4 kg   LMP 03/09/2014   SpO2 99%   BMI 29.05 kg/m   Physical Exam Vitals and nursing note reviewed.  Constitutional:      General: She is not in acute distress.    Appearance: She is well-developed. She is not diaphoretic.  HENT:     Head: Normocephalic and atraumatic.     Left Ear: Tympanic membrane is injected and bulging.  Eyes:     Conjunctiva/sclera: Conjunctivae normal.  Cardiovascular:     Rate and Rhythm: Normal rate and regular rhythm.     Heart sounds: Normal heart sounds. No murmur heard.   No friction rub. No gallop.  Pulmonary:     Effort: Pulmonary effort is normal. No respiratory distress.     Breath sounds: Normal breath sounds. No wheezing or rales.  Abdominal:     General: There is no distension.     Palpations: Abdomen is soft.     Tenderness: There is no  abdominal tenderness. There is no guarding.  Musculoskeletal:        General: No tenderness.     Cervical back: Normal range of motion.  Skin:    General: Skin is warm and dry.     Findings: No erythema or rash.  Neurological:     Mental Status: She is alert and oriented to person,  place, and time.    ED Results / Procedures / Treatments   Labs (all labs ordered are listed, but only abnormal results are displayed) Labs Reviewed  RESP PANEL BY RT-PCR (FLU A&B, COVID) [id number]    EKG None  Radiology DG Chest Portable 1 View  Result Date: 05/13/2021 CLINICAL DATA:  Cough and runny nose for 2 weeks EXAM: PORTABLE CHEST 1 VIEW COMPARISON:  05/06/2019 FINDINGS: The heart size and mediastinal contours are within normal limits. Both lungs are clear. The visualized skeletal structures are unremarkable. IMPRESSION: No active disease. Electronically Signed   By: Alcide Clever M.D.   On: 05/13/2021 20:17    Procedures Procedures   Medications Ordered in ED Medications  ipratropium-albuterol (DUONEB) 0.5-2.5 (3) MG/3ML nebulizer solution 3 mL (3 mLs Nebulization Given 05/13/21 1952)    ED Course  I have reviewed the triage vital signs and the nursing notes.  Pertinent labs & imaging results that were available during my care of the patient were reviewed by me and considered in my medical decision making (see chart for details).    MDM Rules/Calculators/A&P                           35yo female with history of hypertension presents with concern for cough and congestion for 2 weeks. CXR without pneumonia, pneumothorax or other abnormalities. Daughter is also in the ED for cough/congestion.  Suspect likely continuing or repeat viral infection, however does have signs of otitis media.  Given rx for augmentin for sinusitis/otitis, tessalon for cough. COVID, flu testing negative.  Patient discharged in stable condition with understanding of reasons to return.     Final Clinical  Impression(s) / ED Diagnoses Final diagnoses:  Upper respiratory tract infection, unspecified type  Acute suppurative otitis media of left ear without spontaneous rupture of tympanic membrane, recurrence not specified    Rx / DC Orders ED Discharge Orders          Ordered    amoxicillin-clavulanate (AUGMENTIN) 875-125 MG tablet  Every 12 hours        05/13/21 2041    benzonatate (TESSALON) 100 MG capsule  Every 8 hours        05/13/21 2041             Alvira Monday, MD 05/14/21 2208

## 2021-05-13 NOTE — ED Notes (Signed)
RT gave pt treatment for chest/airway tightness pt bilat BS clr/dim. Post treatment pt has some relief from symptoms. RT will continue to monitor.

## 2021-05-13 NOTE — Progress Notes (Signed)
[name], Blankenship are scheduled for a virtual visit with your provider today.    Just as we do with appointments in the office, we must obtain your consent to participate.  Your consent will be active for this visit and any virtual visit you may have with one of our providers in the next 365 days.    If you have a MyChart account, I can also send a copy of this consent to you electronically.  All virtual visits are billed to your insurance company just like a traditional visit in the office.  As this is a virtual visit, video technology does not allow for your provider to perform a traditional examination.  This may limit your provider's ability to fully assess your condition.  If your provider identifies any concerns that need to be evaluated in person or the need to arrange testing such as labs, EKG, etc, we will make arrangements to do so.    Although advances in technology are sophisticated, we cannot ensure that it will always work on either your end or our end.  If the connection with a video visit is poor, we may have to switch to a telephone visit.  With either a video or telephone visit, we are not always able to ensure that we have a secure connection.   I need to obtain your verbal consent now.   Are you willing to proceed with your visit today?   AVANNI TURNBAUGH has provided verbal consent on 05/13/2021 for a virtual visit (video or telephone).   Dahlia Client Falesha Schommer, PA-C 05/13/2021  3:15 PM   Date:  05/13/2021   ID:  Brenda Blankenship, DOB October 28, 1985, MRN [id number]  Patient Location: Home Provider Location: Home Office   Participants: Patient and Provider for Visit and Wrap up  Method of visit: Video  Location of Patient: Home Location of Provider: Home Office Consent was obtain for visit over the video. Services rendered by provider: Visit was performed via video  A video enabled telemedicine application was used and I verified that I am speaking with the correct person using  two identifiers.  PCP:  Arvilla Market, MD   Chief Complaint:  lingering cough and uri symptoms  History of Present Illness:    Brenda Blankenship is a 35 y.o. female with history as stated below. Presents video telehealth for an acute care visit.  Pt reports she and her daughter (age 75) have been sick for approx 2 weeks.  Reports negative COVID tests more than one.    They both have cough and chest congestion.  Daughter has asthma and has been using nebulizer.  She reports using the daughters nebulizer without relief.    No fever.   Modifying factors include: dayquil, cold and flu, allergy medications, vicks vapor rub, Mucinex, nasonex  No other aggravating or relieving factors.  No other c/o.  The patient does have symptoms concerning for COVID-19 infection (fever, chills, cough, or new shortness of breath).  Patient has been tested for COVID during this illness - result: negative  Past Medical, Surgical, Social History, Allergies, and Medications have been Reviewed.  HX of HTN and Lupus.   Takes - hydroxychloroquine 100mg    Patient Active Problem List   Diagnosis Date Noted   Postoperative state 04/12/2014   DUB (dysfunctional uterine bleeding) 03/08/2014   Dysmenorrhea 03/08/2014    Social History   Tobacco Use   Smoking status: Every Day    Packs/day: 0.50    Years: 0.00  Pack years: 0.00    Types: Cigarettes   Smokeless tobacco: Never   Tobacco comments:    stopped with preg  Substance Use Topics   Alcohol use: Yes    Comment: socially 1-2 per day     Current Outpatient Medications:    acetaminophen (TYLENOL) 500 MG tablet, Take 2 tablets (1,000 mg total) by mouth every 8 (eight) hours as needed., Disp: 30 tablet, Rfl: 0   dicyclomine (BENTYL) 10 MG capsule, Take 1 capsule (10 mg total) by mouth every 6 (six) hours as needed for spasms (for abdominal pain)., Disp: 120 capsule, Rfl: 0   gabapentin (NEURONTIN) 300 MG capsule, Take 1 capsule (300  mg total) by mouth 3 (three) times daily., Disp: 90 capsule, Rfl: 0   hydrochlorothiazide (HYDRODIURIL) 50 MG tablet, Take 1 tablet (50 mg total) by mouth daily., Disp: 60 tablet, Rfl: 0   hydroxychloroquine (PLAQUENIL) 200 MG tablet, Take 200 mg by mouth 2 (two) times daily., Disp: , Rfl:    ibuprofen (ADVIL) 800 MG tablet, Take 1 tablet (800 mg total) by mouth every 8 (eight) hours as needed., Disp: 21 tablet, Rfl: 0   losartan (COZAAR) 25 MG tablet, Take 1 tablet (25 mg total) by mouth daily., Disp: 90 tablet, Rfl: 0   traZODone (DESYREL) 50 MG tablet, Take 1 tablet (50 mg total) by mouth at bedtime as needed for sleep., Disp: 30 tablet, Rfl: 1   Allergies  Allergen Reactions   Hydrocodone Other (See Comments)    Pt states that this medication causes her to pass out. Pt can take oxycodone    Tape Itching   Naprosyn [Naproxen] Itching and Rash    Pt can take ibuprofen and other NSAIDs     Review of Systems  Constitutional:  Positive for fever. Negative for chills.  HENT:  Positive for congestion and sore throat. Negative for ear pain.   Eyes:  Negative for blurred vision and double vision.  Respiratory:  Positive for cough and wheezing. Negative for shortness of breath.   Cardiovascular:  Negative for chest pain, palpitations and leg swelling.  Gastrointestinal:  Negative for abdominal pain, diarrhea, nausea and vomiting.  Genitourinary:  Negative for dysuria.  Musculoskeletal:  Positive for myalgias.  Skin:  Negative for rash.  Neurological:  Negative for loss of consciousness, weakness and headaches.  Psychiatric/Behavioral:  The patient is not nervous/anxious.   See HPI for history of present illness.  Physical Exam Constitutional:      General: She is not in acute distress.    Appearance: Normal appearance. She is not ill-appearing.  HENT:     Head: Normocephalic and atraumatic.     Nose: No congestion.  Eyes:     Extraocular Movements: Extraocular movements intact.   Pulmonary:     Effort: Pulmonary effort is normal.     Comments: Speaks in full sentences Musculoskeletal:        General: Normal range of motion.     Cervical back: Normal range of motion.  Skin:    Coloration: Skin is not pale.  Neurological:     General: No focal deficit present.     Mental Status: She is alert. Mental status is at baseline.  Psychiatric:        Mood and Affect: Mood normal.              A&P  1. Persistent cough  - Persistent cough for the last 2 weeks, continued fevers at night and shortness of breath not  resolved with albuterol is concerning for potential pneumonia.  Additionally because you take Plaquenil you are at an increased risk for infection.  You will need to have an in person visit to determine the next course of action.  Continue utilizing your home medications as prescribed and follow-up with your primary care ordered urgent care as soon as possible.   Patient voiced understanding and agreement to plan.   Time:   Today, I have spent 15 minutes with the patient with telehealth technology discussing the above problems, reviewing the chart, previous notes, medications and orders.    Tests Ordered: No orders of the defined types were placed in this encounter.   Medication Changes: No orders of the defined types were placed in this encounter.    Disposition:  Follow up PCP or urgent care for physical exam and possible CXR.  Yetta Barre, PA-C  05/13/2021 3:28 PM

## 2021-05-13 NOTE — ED Triage Notes (Signed)
Pt arrives to ED with cough, runny nose, and congestion x2 weeks. She denies fevers and chills at this time. She reports a dry cough and dry mouth.

## 2021-05-13 NOTE — Patient Instructions (Signed)
  1. Persistent cough  - Persistent cough for the last 2 weeks, continued fevers at night and shortness of breath not resolved with albuterol is concerning for potential pneumonia.  Additionally because you take Plaquenil you are at an increased risk for infection.  You will need to have an in person visit to determine the next course of action.  Continue utilizing your home medications as prescribed and follow-up with your primary care ordered urgent care as soon as possible.

## 2021-05-21 ENCOUNTER — Telehealth: Payer: Medicaid Other | Admitting: Family Medicine

## 2021-05-21 DIAGNOSIS — R058 Other specified cough: Secondary | ICD-10-CM

## 2021-05-21 MED ORDER — PROMETHAZINE-DM 6.25-15 MG/5ML PO SYRP: 2.5000 mL | ORAL_SOLUTION | Freq: Three times a day (TID) | ORAL | 0 refills | Status: DC | PRN

## 2021-05-21 MED ORDER — ALBUTEROL SULFATE HFA 108 (90 BASE) MCG/ACT IN AERS: 1.0000 | INHALATION_SPRAY | RESPIRATORY_TRACT | 0 refills | Status: DC | PRN

## 2021-05-21 NOTE — Patient Instructions (Signed)
I appreciate the opportunity to provide you with care for your health and wellness.  I hope you feel better.  Please take the medication as directed.  Please go be seen in person if you are not feeling better in 7-10 days, if you develop rust colored mucus, pain with breathing, or a fever.  Please continue to practice social distancing to keep you, your family, and our community safe.  If you must go out, please wear a mask and practice good handwashing.  Have a wonderful day. With Gratitude, Tereasa Coop, DNP, AGNP-BC

## 2021-05-21 NOTE — Progress Notes (Signed)
Virtual Visit Consent   Brenda Blankenship, you are scheduled for a virtual visit with a [facility] provider today.     Just as with appointments in the office, your consent must be obtained to participate.  Your consent will be active for this visit and any virtual visit you may have with one of our providers in the next 365 days.     If you have a MyChart account, a copy of this consent can be sent to you electronically.  All virtual visits are billed to your insurance company just like a traditional visit in the office.    As this is a virtual visit, video technology does not allow for your provider to perform a traditional examination.  This may limit your provider's ability to fully assess your condition.  If your provider identifies any concerns that need to be evaluated in person or the need to arrange testing (such as labs, EKG, etc.), we will make arrangements to do so.     Although advances in technology are sophisticated, we cannot ensure that it will always work on either your end or our end.  If the connection with a video visit is poor, the visit may have to be switched to a telephone visit.  With either a video or telephone visit, we are not always able to ensure that we have a secure connection.     I need to obtain your verbal consent now.   Are you willing to proceed with your visit today?    Brenda Blankenship has provided verbal consent on 05/21/2021 for a virtual visit (video or telephone).   Freddy Finner, NP   Date: 05/21/2021 1:35 PM   Virtual Visit via Video Note   I, Freddy Finner, connected with  Brenda Blankenship  ([id number], 10-Apr-1986) on 05/21/21 at  1:45 PM EDT by a video-enabled telemedicine application and verified that I am speaking with the correct person using two identifiers.  Location: Patient: Virtual Visit Location Patient: Home Provider: Virtual Visit Location Provider: Home Office   I discussed the limitations of evaluation and management by  telemedicine and the availability of in person appointments. The patient expressed understanding and agreed to proceed.    History of Present Illness: Brenda Blankenship is a 35 y.o. who identifies as a female who was assigned female at birth, and is being seen today for on going cough post infection. Recent ear infection/URI. Treated in ED- see Epic for note. Almost finished with Augmentin. Reports ear pain is better. But cough is worse. Perles do not help. Wet feeling and sound when coughing. Denies fever, chills, shortness of breath, or chest pain at this time. Denies headache, ear or sore throat pain. No other changes to report. Tried some old prednisone- no help. Mucus is specked brown as it was in hospital no changes.   Was neg for PNA on 10/18   Problems:  Patient Active Problem List   Diagnosis Date Noted   Postoperative state 04/12/2014   DUB (dysfunctional uterine bleeding) 03/08/2014   Dysmenorrhea 03/08/2014    Allergies:  Allergies  Allergen Reactions   Hydrocodone Other (See Comments)    Pt states that this medication causes her to pass out. Pt can take oxycodone    Tape Itching   Naprosyn [Naproxen] Itching and Rash    Pt can take ibuprofen and other NSAIDs   Medications:  Current Outpatient Medications:    acetaminophen (TYLENOL) 500 MG tablet, Take 2 tablets (  1,000 mg total) by mouth every 8 (eight) hours as needed., Disp: 30 tablet, Rfl: 0   amoxicillin-clavulanate (AUGMENTIN) 875-125 MG tablet, Take 1 tablet by mouth every 12 (twelve) hours for 10 days., Disp: 20 tablet, Rfl: 0   benzonatate (TESSALON) 100 MG capsule, Take 1 capsule (100 mg total) by mouth every 8 (eight) hours., Disp: 21 capsule, Rfl: 0   dicyclomine (BENTYL) 10 MG capsule, Take 1 capsule (10 mg total) by mouth every 6 (six) hours as needed for spasms (for abdominal pain)., Disp: 120 capsule, Rfl: 0   gabapentin (NEURONTIN) 300 MG capsule, Take 1 capsule (300 mg total) by mouth 3 (three) times daily.,  Disp: 90 capsule, Rfl: 0   hydrochlorothiazide (HYDRODIURIL) 50 MG tablet, Take 1 tablet (50 mg total) by mouth daily., Disp: 60 tablet, Rfl: 0   hydroxychloroquine (PLAQUENIL) 200 MG tablet, Take 200 mg by mouth 2 (two) times daily., Disp: , Rfl:    ibuprofen (ADVIL) 800 MG tablet, Take 1 tablet (800 mg total) by mouth every 8 (eight) hours as needed., Disp: 21 tablet, Rfl: 0   losartan (COZAAR) 25 MG tablet, Take 1 tablet (25 mg total) by mouth daily., Disp: 90 tablet, Rfl: 0   traZODone (DESYREL) 50 MG tablet, Take 1 tablet (50 mg total) by mouth at bedtime as needed for sleep., Disp: 30 tablet, Rfl: 1  Observations/Objective: Patient is well-developed, well-nourished in no acute distress.  Resting comfortably  at home.  Head is normocephalic, atraumatic.  No labored breathing.  Speech is clear and coherent with logical content.  Patient is alert and oriented at baseline.  Bronchial/Wet cough  Nasal tone and hoarseness   Assessment and Plan: 1. Post-viral cough syndrome -post infectious cough -OTC reviewed, in addition to mucinex, advised to use the scripts below  -red flags reviewed for possible pna signs and when to go to ED- on avs    - promethazine-dextromethorphan (PROMETHAZINE-DM) 6.25-15 MG/5ML syrup; Take 2.5 mLs by mouth 3 (three) times daily as needed for cough.  Dispense: 118 mL; Refill: 0 - albuterol (VENTOLIN HFA) 108 (90 Base) MCG/ACT inhaler; Inhale 1-2 puffs into the lungs every 4 (four) hours as needed for wheezing or shortness of breath.  Dispense: 8 g; Refill: 0   Follow Up Instructions: I discussed the assessment and treatment plan with the patient. The patient was provided an opportunity to ask questions and all were answered. The patient agreed with the plan and demonstrated an understanding of the instructions.  A copy of instructions were sent to the patient via MyChart unless otherwise noted below.    The patient was advised to call back or seek an  in-person evaluation if the symptoms worsen or if the condition fails to improve as anticipated.  Time:  I spent 10 minutes with the patient via telehealth technology discussing the above problems/concerns.    Freddy Finner, NP

## 2021-05-27 ENCOUNTER — Other Ambulatory Visit: Payer: Self-pay

## 2021-05-27 ENCOUNTER — Telehealth: Payer: Medicaid Other | Admitting: Physician Assistant

## 2021-05-27 ENCOUNTER — Emergency Department (HOSPITAL_BASED_OUTPATIENT_CLINIC_OR_DEPARTMENT_OTHER)
Admission: EM | Admit: 2021-05-27 | Discharge: 2021-05-27 | Disposition: A | Discharge status: Home / Self Care | Payer: Medicaid Other | Attending: Emergency Medicine | Admitting: Emergency Medicine

## 2021-05-27 ENCOUNTER — Emergency Department (HOSPITAL_BASED_OUTPATIENT_CLINIC_OR_DEPARTMENT_OTHER): Payer: Medicaid Other | Admitting: Radiology

## 2021-05-27 DIAGNOSIS — J209 Acute bronchitis, unspecified: Secondary | ICD-10-CM | POA: Diagnosis not present

## 2021-05-27 DIAGNOSIS — I10 Essential (primary) hypertension: Secondary | ICD-10-CM | POA: Diagnosis not present

## 2021-05-27 DIAGNOSIS — Z79899 Other long term (current) drug therapy: Secondary | ICD-10-CM | POA: Insufficient documentation

## 2021-05-27 DIAGNOSIS — F17210 Nicotine dependence, cigarettes, uncomplicated: Secondary | ICD-10-CM | POA: Insufficient documentation

## 2021-05-27 DIAGNOSIS — R059 Cough, unspecified: Secondary | ICD-10-CM | POA: Diagnosis not present

## 2021-05-27 DIAGNOSIS — R0602 Shortness of breath: Secondary | ICD-10-CM | POA: Diagnosis not present

## 2021-05-27 DIAGNOSIS — R079 Chest pain, unspecified: Secondary | ICD-10-CM

## 2021-05-27 DIAGNOSIS — Z419 Encounter for procedure for purposes other than remedying health state, unspecified: Secondary | ICD-10-CM | POA: Diagnosis not present

## 2021-05-27 DIAGNOSIS — J189 Pneumonia, unspecified organism: Secondary | ICD-10-CM

## 2021-05-27 LAB — CBC WITH DIFFERENTIAL/PLATELET
Abs Immature Granulocytes: 0.01 10*3/uL (ref 0.00–0.07)
Basophils Absolute: 0 10*3/uL (ref 0.0–0.1)
Basophils Relative: 1 %
Eosinophils Absolute: 0 10*3/uL (ref 0.0–0.5)
Eosinophils Relative: 1 %
HCT: 35.1 % — ABNORMAL LOW (ref 36.0–46.0)
Hemoglobin: 11.8 g/dL — ABNORMAL LOW (ref 12.0–15.0)
Immature Granulocytes: 0 %
Lymphocytes Relative: 44 %
Lymphs Abs: 1.6 10*3/uL (ref 0.7–4.0)
MCH: 28.3 pg (ref 26.0–34.0)
MCHC: 33.6 g/dL (ref 30.0–36.0)
MCV: 84.2 fL (ref 80.0–100.0)
Monocytes Absolute: 0.5 10*3/uL (ref 0.1–1.0)
Monocytes Relative: 13 %
Neutro Abs: 1.5 10*3/uL — ABNORMAL LOW (ref 1.7–7.7)
Neutrophils Relative %: 41 %
Platelets: 272 10*3/uL (ref 150–400)
RBC: 4.17 MIL/uL (ref 3.87–5.11)
RDW: 12.9 % (ref 11.5–15.5)
Smear Review: NORMAL
WBC: 3.7 10*3/uL — ABNORMAL LOW (ref 4.0–10.5)
nRBC: 0 % (ref 0.0–0.2)

## 2021-05-27 LAB — COMPREHENSIVE METABOLIC PANEL
ALT: 10 U/L (ref 0–44)
AST: 16 U/L (ref 15–41)
Albumin: 3.8 g/dL (ref 3.5–5.0)
Alkaline Phosphatase: 52 U/L (ref 38–126)
Anion gap: 8 (ref 5–15)
BUN: 6 mg/dL (ref 6–20)
CO2: 26 mmol/L (ref 22–32)
Calcium: 8.5 mg/dL — ABNORMAL LOW (ref 8.9–10.3)
Chloride: 104 mmol/L (ref 98–111)
Creatinine, Ser: 0.85 mg/dL (ref 0.44–1.00)
GFR, Estimated: >60 mL/min (ref >60)
Glucose, Bld: 79 mg/dL (ref 70–99)
Potassium: 4 mmol/L (ref 3.5–5.1)
Sodium: 138 mmol/L (ref 135–145)
Total Bilirubin: 0.5 mg/dL (ref 0.3–1.2)
Total Protein: 6.8 g/dL (ref 6.5–8.1)

## 2021-05-27 LAB — TROPONIN I (HIGH SENSITIVITY): Troponin I (High Sensitivity): 3 ng/L (ref <18)

## 2021-05-27 MED ORDER — PREDNISONE 20 MG PO TABS: 40.0000 mg | ORAL_TABLET | Freq: Every day | ORAL | 0 refills | Status: AC

## 2021-05-27 MED ORDER — AZITHROMYCIN 250 MG PO TABS: 250.0000 mg | ORAL_TABLET | Freq: Every day | ORAL | 0 refills | Status: DC

## 2021-05-27 MED ORDER — AZITHROMYCIN 250 MG PO TABS: 500.0000 mg | ORAL_TABLET | Freq: Once | ORAL | Status: DC

## 2021-05-27 MED ORDER — PREDNISONE 50 MG PO TABS: 60.0000 mg | ORAL_TABLET | Freq: Once | ORAL | Status: DC

## 2021-05-27 MED ORDER — ACETAMINOPHEN 500 MG PO TABS
1000.0000 mg | ORAL_TABLET | Freq: Once | ORAL | Status: AC
  Administered 2021-05-27: 1000 mg via ORAL
  Filled 2021-05-27: qty 2

## 2021-05-27 MED ORDER — ALBUTEROL SULFATE (2.5 MG/3ML) 0.083% IN NEBU
5.0000 mg | INHALATION_SOLUTION | Freq: Once | RESPIRATORY_TRACT | Status: AC
  Administered 2021-05-27: 5 mg via RESPIRATORY_TRACT
  Filled 2021-05-27: qty 6

## 2021-05-27 NOTE — ED Notes (Signed)
ED Provider at bedside. 

## 2021-05-27 NOTE — ED Provider Notes (Signed)
[facility] EMERGENCY DEPT Provider Note   CSN: [id number] Arrival date & time: 05/27/21  9355     History Chief Complaint  Patient presents with   Cough    Brenda Blankenship is a 35 y.o. female.  HPI 35 year old female presents with cough.  This is the fourth week she has been coughing.  She has been getting up some brown sputum.  She states she is not really short of breath but is coughing so frequently is hard to breathe.  Over the last 2 days or so she has had continuous chest pressure and occasional squeezing in her chest.  Does not hurt worse with coughing.  No leg swelling.  She has not had fevers though 1 time she had some sweats in the night.  She has lupus.  She has been put on multiple medicines during this time including blood pressure medicines, albuterol, Tessalon pearls, Augmentin, Mucinex, and she is already on Plaquenil.  Had a virtual visit with her doctor and because she was having chest pain they told her to go to the ER.  Over the last few days she has been hearing some wheezing. Smokes 1/2 ppd.  Past Medical History:  Diagnosis Date   Abnormal cervical Papanicolaou smear    cryo   Anemia    Anxiety    DUB (dysfunctional uterine bleeding)    Environmental allergies    HTN (hypertension)    Pregnancy induced hypertension    Preterm labor     Patient Active Problem List   Diagnosis Date Noted   Postoperative state 04/12/2014   DUB (dysfunctional uterine bleeding) 03/08/2014   Dysmenorrhea 03/08/2014    Past Surgical History:  Procedure Laterality Date   BILATERAL SALPINGECTOMY Bilateral 04/12/2014   Procedure: BILATERAL SALPINGECTOMY;  Surgeon: Adam Phenix, MD;  Location: [facility];  Service: Gynecology;  Laterality: Bilateral;   CRYOTHERAPY     LAPAROSCOPIC TUBAL LIGATION Bilateral 11/10/2013   Procedure: bilateral LAPAROSCOPIC TUBAL LIGATION with filshie clips;  Surgeon: Freddrick March. Tenny Craw, MD;  Location: [facility];  Service: Gynecology;   Laterality: Bilateral;   VAGINAL HYSTERECTOMY N/A 04/12/2014   Procedure: HYSTERECTOMY VAGINAL;  Surgeon: Adam Phenix, MD;  Location: [facility];  Service: Gynecology;  Laterality: N/A;     OB History     Gravida  3   Para  2   Term  2   Preterm  0   AB  1   Living  2      SAB  0   IAB  1   Ectopic  0   Multiple  0   Live Births  2           Family History  Problem Relation Age of Onset   Hypertension Mother    Clotting disorder Mother    Hypertension Father    Hyperlipidemia Father    Irritable bowel syndrome Father    Diabetes Maternal Grandmother    Esophageal cancer Maternal Grandfather    Cancer Paternal Grandmother        started in lung   Hypertension Paternal Grandmother    Prostate cancer Paternal Uncle     Social History   Tobacco Use   Smoking status: Every Day    Packs/day: 0.50    Years: 0.00    Pack years: 0.00    Types: Cigarettes   Smokeless tobacco: Never   Tobacco comments:    stopped with preg  Vaping Use   Vaping Use: Never used  Substance Use Topics   Alcohol use: Yes    Comment: socially 1-2 per day   Drug use: No    Types: Marijuana    Comment: stopped with preg    Home Medications Prior to Admission medications   Medication Sig Start Date End Date Taking? Authorizing Provider  albuterol (VENTOLIN HFA) 108 (90 Base) MCG/ACT inhaler Inhale 1-2 puffs into the lungs every 4 (four) hours as needed for wheezing or shortness of breath. 05/21/21  Yes Freddy Finner, NP  azithromycin (ZITHROMAX) 250 MG tablet Take 1 tablet (250 mg total) by mouth daily. 05/28/21  Yes Pricilla Loveless, MD  gabapentin (NEURONTIN) 300 MG capsule Take 1 capsule (300 mg total) by mouth 3 (three) times daily. 12/02/20  Yes Arvilla Market, MD  hydrochlorothiazide (HYDRODIURIL) 50 MG tablet Take 1 tablet (50 mg total) by mouth daily. 04/28/21 06/27/21 Yes Zonia Kief, Amy J, NP  hydroxychloroquine (PLAQUENIL) 200 MG tablet Take 200 mg by mouth 2  (two) times daily. 04/01/21  Yes [provider]  losartan (COZAAR) 25 MG tablet Take 1 tablet (25 mg total) by mouth daily. 04/28/21 07/27/21 Yes Zonia Kief, Amy J, NP  predniSONE (DELTASONE) 20 MG tablet Take 2 tablets (40 mg total) by mouth daily for 4 days. 05/28/21 06/01/21 Yes Pricilla Loveless, MD  promethazine-dextromethorphan (PROMETHAZINE-DM) 6.25-15 MG/5ML syrup Take 2.5 mLs by mouth 3 (three) times daily as needed for cough. 05/21/21  Yes Freddy Finner, NP  traZODone (DESYREL) 50 MG tablet Take 1 tablet (50 mg total) by mouth at bedtime as needed for sleep. 04/28/21 06/27/21 Yes Zonia Kief, Amy J, NP  acetaminophen (TYLENOL) 500 MG tablet Take 2 tablets (1,000 mg total) by mouth every 8 (eight) hours as needed. Patient not taking: Reported on 05/27/2021 09/12/19   Darr, Gerilyn Pilgrim, PA-C  benzonatate (TESSALON) 100 MG capsule Take 1 capsule (100 mg total) by mouth every 8 (eight) hours. Patient not taking: Reported on 05/27/2021 05/13/21   Alvira Monday, MD  dicyclomine (BENTYL) 10 MG capsule Take 1 capsule (10 mg total) by mouth every 6 (six) hours as needed for spasms (for abdominal pain). 02/03/21 03/05/21  Jenel Lucks, MD  ibuprofen (ADVIL) 800 MG tablet Take 1 tablet (800 mg total) by mouth every 8 (eight) hours as needed. Patient not taking: Reported on 05/27/2021 08/20/20   Mickie Bail, NP  omeprazole (PRILOSEC) 20 MG capsule Take 1 capsule (20 mg total) by mouth daily. Patient not taking: Reported on 02/09/2019 01/25/17 02/09/19  Muthersbaugh, Dahlia Client, PA-C    Allergies    Hydrocodone, Tape, and Naprosyn [naproxen]  Review of Systems   Review of Systems  Constitutional:  Negative for fever.  HENT:  Positive for postnasal drip. Negative for sore throat.   Respiratory:  Positive for cough, shortness of breath and wheezing.   Cardiovascular:  Positive for chest pain. Negative for leg swelling.  All other systems reviewed and are negative.  Physical Exam Updated Vital Signs BP (!)  139/106 (BP Location: Right Arm)   Pulse 77   Temp 98.5 F (36.9 C) (Oral)   Resp 13   Ht 5\' 3"  (1.6 m)   Wt 76.2 kg   LMP 03/09/2014   SpO2 100%   BMI 29.76 kg/m   Physical Exam Vitals and nursing note reviewed.  Constitutional:      General: She is not in acute distress.    Appearance: She is well-developed. She is not ill-appearing or diaphoretic.  HENT:     Head: Normocephalic and  atraumatic.     Right Ear: External ear normal.     Left Ear: External ear normal.     Nose: Nose normal.  Eyes:     General:        Right eye: No discharge.        Left eye: No discharge.  Cardiovascular:     Rate and Rhythm: Normal rate and regular rhythm.     Heart sounds: Normal heart sounds.  Pulmonary:     Effort: Pulmonary effort is normal.     Breath sounds: Wheezing present.     Comments: Frequent cough Abdominal:     General: There is no distension.     Palpations: Abdomen is soft.     Tenderness: There is no abdominal tenderness.  Skin:    General: Skin is warm and dry.  Neurological:     Mental Status: She is alert.  Psychiatric:        Mood and Affect: Mood is not anxious.    ED Results / Procedures / Treatments   Labs (all labs ordered are listed, but only abnormal results are displayed) Labs Reviewed  COMPREHENSIVE METABOLIC PANEL - Abnormal; Notable for the following components:      Result Value   Calcium 8.5 (*)    All other components within normal limits  CBC WITH DIFFERENTIAL/PLATELET - Abnormal; Notable for the following components:   WBC 3.7 (*)    Hemoglobin 11.8 (*)    HCT 35.1 (*)    Neutro Abs 1.5 (*)    All other components within normal limits  TROPONIN I (HIGH SENSITIVITY)    EKG EKG Interpretation  Date/Time:  Tuesday May 27 2021 09:01:46 EDT Ventricular Rate:  74 PR Interval:  156 QRS Duration: 76 QT Interval:  403 QTC Calculation: 448 R Axis:   42 Text Interpretation: Sinus rhythm Probable left atrial enlargement similar to oct  2020 Confirmed by Pricilla Loveless [phone]) on 05/27/2021 9:17:36 AM  Radiology DG Chest 2 View  Result Date: 05/27/2021 CLINICAL DATA:  Cough. EXAM: CHEST - 2 VIEW COMPARISON:  May 13, 2021. FINDINGS: The heart size and mediastinal contours are within normal limits. Both lungs are clear. The visualized skeletal structures are unremarkable. IMPRESSION: No active cardiopulmonary disease. Electronically Signed   By: Lupita Raider M.D.   On: 05/27/2021 10:12    Procedures Procedures   Medications Ordered in ED Medications  predniSONE (DELTASONE) tablet 60 mg (has no administration in time range)  azithromycin (ZITHROMAX) tablet 500 mg (has no administration in time range)  acetaminophen (TYLENOL) tablet 1,000 mg (1,000 mg Oral Given 05/27/21 1026)  albuterol (PROVENTIL) (2.5 MG/3ML) 0.083% nebulizer solution 5 mg (5 mg Nebulization Given 05/27/21 1012)    ED Course  I have reviewed the triage vital signs and the nursing notes.  Pertinent labs & imaging results that were available during my care of the patient were reviewed by me and considered in my medical decision making (see chart for details).    MDM Rules/Calculators/A&P                           Patient feels a little better with albuterol.  She is hypertensive but otherwise vitals are benign.  Troponin is negative and in the setting of multiple days of chest pain I think ACS, myocarditis, etc. is pretty low.  Doubt PE.  She is on Plaquenil and has lupus.  We discussed that steroids might help from  a bronchitis/wheezing standpoint but could suppress her immune system along with Plaquenil.  She would prefer to do it as she is miserable.  I think this is reasonable and we will also treat as possible atypical pneumonia with azithromycin.  Follow-up closely with PCP.  May need outpatient pulmonology referral. Final Clinical Impression(s) / ED Diagnoses Final diagnoses:  Acute bronchitis, unspecified organism    Rx / DC Orders ED  Discharge Orders          Ordered    azithromycin (ZITHROMAX) 250 MG tablet  Daily        05/27/21 1246    predniSONE (DELTASONE) 20 MG tablet  Daily        05/27/21 1246             Pricilla Loveless, MD 05/27/21 1317

## 2021-05-27 NOTE — ED Notes (Signed)
D/c paperwork reviewed with pt, including prescriptions. Pt with no questions or concerns at time of d/c. Pt ambulatory at time of d/c.

## 2021-05-27 NOTE — Progress Notes (Signed)
Virtual Visit Consent   Brenda Blankenship, you are scheduled for a virtual visit with a [facility] provider today.     Just as with appointments in the office, your consent must be obtained to participate.  Your consent will be active for this visit and any virtual visit you may have with one of our providers in the next 365 days.     If you have a MyChart account, a copy of this consent can be sent to you electronically.  All virtual visits are billed to your insurance company just like a traditional visit in the office.    As this is a virtual visit, video technology does not allow for your provider to perform a traditional examination.  This may limit your provider's ability to fully assess your condition.  If your provider identifies any concerns that need to be evaluated in person or the need to arrange testing (such as labs, EKG, etc.), we will make arrangements to do so.     Although advances in technology are sophisticated, we cannot ensure that it will always work on either your end or our end.  If the connection with a video visit is poor, the visit may have to be switched to a telephone visit.  With either a video or telephone visit, we are not always able to ensure that we have a secure connection.     I need to obtain your verbal consent now.   Are you willing to proceed with your visit today?    Brenda Blankenship has provided verbal consent on 05/27/2021 for a virtual visit (video or telephone).   [name], PA-C   Date: 05/27/2021 8:28 AM   Virtual Visit via Video Note   I, [name], connected with  Brenda Blankenship  ([id number], [birth year]) on 05/27/21 at  8:15 AM EDT by a video-enabled telemedicine application and verified that I am speaking with the correct person using two identifiers.  Location: Patient: Virtual Visit Location Patient: Home Provider: Virtual Visit Location Provider: Home Office   I discussed the limitations of evaluation and management by  telemedicine and the availability of in person appointments. The patient expressed understanding and agreed to proceed.    History of Present Illness: Brenda Blankenship is a 35 y.o. who identifies as a female who was assigned female at birth, and is being seen today for continued cough.  HPI: Cough This is a new problem. The current episode started 1 to 4 weeks ago. The problem has been gradually worsening. The problem occurs every few minutes. The cough is Non-productive (gets some light brown mucous up with Mucinex, otherwise dry). Associated symptoms include chest pain, chills, myalgias, postnasal drip, rhinorrhea, a sore throat (from coughing), shortness of breath (off and on) and wheezing. Pertinent negatives include no ear congestion, ear pain, fever, headaches or nasal congestion. The symptoms are aggravated by lying down. She has tried a beta-agonist inhaler (mucinex, augmentin, promethazine DM) for the symptoms. The treatment provided no relief. Her past medical history is significant for pneumonia (long time ago). There is no history of asthma or bronchitis.   She is concerned of her chest pain she is now having as well from coughing for over 4 weeks.   She was seen at [facility] on 05/13/21 and treated with Augmentin for an URI with ear infection. She was re-evaluated virtually on 05/21/21 and treated for possible post viral cough syndrome with Albuterol and promethazine DM. Patient reports no improvement  in symptoms and is feeling worse because she is having chest pain, mild SOB, and continued cough that is effecting sleep.  Problems:  Patient Active Problem List   Diagnosis Date Noted   Postoperative state 04/12/2014   DUB (dysfunctional uterine bleeding) 03/08/2014   Dysmenorrhea 03/08/2014    Allergies:  Allergies  Allergen Reactions   Hydrocodone Other (See Comments)    Pt states that this medication causes her to pass out. Pt can take oxycodone    Tape Itching   Naprosyn [Naproxen]  Itching and Rash    Pt can take ibuprofen and other NSAIDs   Medications:  Current Outpatient Medications:    acetaminophen (TYLENOL) 500 MG tablet, Take 2 tablets (1,000 mg total) by mouth every 8 (eight) hours as needed., Disp: 30 tablet, Rfl: 0   albuterol (VENTOLIN HFA) 108 (90 Base) MCG/ACT inhaler, Inhale 1-2 puffs into the lungs every 4 (four) hours as needed for wheezing or shortness of breath., Disp: 8 g, Rfl: 0   benzonatate (TESSALON) 100 MG capsule, Take 1 capsule (100 mg total) by mouth every 8 (eight) hours., Disp: 21 capsule, Rfl: 0   dicyclomine (BENTYL) 10 MG capsule, Take 1 capsule (10 mg total) by mouth every 6 (six) hours as needed for spasms (for abdominal pain)., Disp: 120 capsule, Rfl: 0   gabapentin (NEURONTIN) 300 MG capsule, Take 1 capsule (300 mg total) by mouth 3 (three) times daily., Disp: 90 capsule, Rfl: 0   hydrochlorothiazide (HYDRODIURIL) 50 MG tablet, Take 1 tablet (50 mg total) by mouth daily., Disp: 60 tablet, Rfl: 0   hydroxychloroquine (PLAQUENIL) 200 MG tablet, Take 200 mg by mouth 2 (two) times daily., Disp: , Rfl:    ibuprofen (ADVIL) 800 MG tablet, Take 1 tablet (800 mg total) by mouth every 8 (eight) hours as needed., Disp: 21 tablet, Rfl: 0   losartan (COZAAR) 25 MG tablet, Take 1 tablet (25 mg total) by mouth daily., Disp: 90 tablet, Rfl: 0   promethazine-dextromethorphan (PROMETHAZINE-DM) 6.25-15 MG/5ML syrup, Take 2.5 mLs by mouth 3 (three) times daily as needed for cough., Disp: 118 mL, Rfl: 0   traZODone (DESYREL) 50 MG tablet, Take 1 tablet (50 mg total) by mouth at bedtime as needed for sleep., Disp: 30 tablet, Rfl: 1  Observations/Objective: Patient is well-developed, well-nourished in no acute distress.  Resting comfortably at home.  Head is normocephalic, atraumatic.  No labored breathing.  Speech is clear and coherent with logical content.  Patient is alert and oriented at baseline.  Dry cough heard frequently.  Assessment and  Plan: 1. Atypical pneumonia  2. Chest pain, unspecified type  - Suspect pneumonia and possible pleurisy vs costochondritis.  - Patient referred to in person evaluation at [facility] for consideration of CXR  Follow Up Instructions: I discussed the assessment and treatment plan with the patient. The patient was provided an opportunity to ask questions and all were answered. The patient agreed with the plan and demonstrated an understanding of the instructions.  A copy of instructions were sent to the patient via MyChart unless otherwise noted below.    The patient was advised to call back or seek an in-person evaluation if the symptoms worsen or if the condition fails to improve as anticipated.  Time:  I spent 15 minutes with the patient via telehealth technology discussing the above problems/concerns.    Mar Daring, PA-C

## 2021-05-27 NOTE — ED Triage Notes (Signed)
Pt arrives POV with approximately one month history of cough, runny nose, and congestion.

## 2021-05-27 NOTE — Patient Instructions (Signed)
Brenda Blankenship, thank you for joining [name], PA-C for today's virtual visit.  While this provider is not your primary care provider (PCP), if your PCP is located in our provider database this encounter information will be shared with them immediately following your visit.  Consent: (Patient) Brenda Blankenship provided verbal consent for this virtual visit at the beginning of the encounter.  Current Medications:  Current Outpatient Medications:    acetaminophen (TYLENOL) 500 MG tablet, Take 2 tablets (1,000 mg total) by mouth every 8 (eight) hours as needed., Disp: 30 tablet, Rfl: 0   albuterol (VENTOLIN HFA) 108 (90 Base) MCG/ACT inhaler, Inhale 1-2 puffs into the lungs every 4 (four) hours as needed for wheezing or shortness of breath., Disp: 8 g, Rfl: 0   benzonatate (TESSALON) 100 MG capsule, Take 1 capsule (100 mg total) by mouth every 8 (eight) hours., Disp: 21 capsule, Rfl: 0   dicyclomine (BENTYL) 10 MG capsule, Take 1 capsule (10 mg total) by mouth every 6 (six) hours as needed for spasms (for abdominal pain)., Disp: 120 capsule, Rfl: 0   gabapentin (NEURONTIN) 300 MG capsule, Take 1 capsule (300 mg total) by mouth 3 (three) times daily., Disp: 90 capsule, Rfl: 0   hydrochlorothiazide (HYDRODIURIL) 50 MG tablet, Take 1 tablet (50 mg total) by mouth daily., Disp: 60 tablet, Rfl: 0   hydroxychloroquine (PLAQUENIL) 200 MG tablet, Take 200 mg by mouth 2 (two) times daily., Disp: , Rfl:    ibuprofen (ADVIL) 800 MG tablet, Take 1 tablet (800 mg total) by mouth every 8 (eight) hours as needed., Disp: 21 tablet, Rfl: 0   losartan (COZAAR) 25 MG tablet, Take 1 tablet (25 mg total) by mouth daily., Disp: 90 tablet, Rfl: 0   promethazine-dextromethorphan (PROMETHAZINE-DM) 6.25-15 MG/5ML syrup, Take 2.5 mLs by mouth 3 (three) times daily as needed for cough., Disp: 118 mL, Rfl: 0   traZODone (DESYREL) 50 MG tablet, Take 1 tablet (50 mg total) by mouth at bedtime as needed for sleep., Disp:  30 tablet, Rfl: 1   Medications ordered in this encounter:  No orders of the defined types were placed in this encounter.    *If you need refills on other medications prior to your next appointment, please contact your pharmacy*  Follow-Up: Call back or seek an in-person evaluation if the symptoms worsen or if the condition fails to improve as anticipated.  Other Instructions Based on what you shared with me, I feel your condition warrants further evaluation and I recommend that you be seen in a face to face visit.   If you are having a true medical emergency please call 911.      For an urgent face to face visit, [facility] has six urgent care centers for your convenience:     [facility] Health Urgent Care Center at [facility] Directions [phone] [address] [place], [place] [address]    [facility] Health Urgent Care Center [facility]) Get Driving Directions [phone] [address] [place], [place] [address]  [facility] [place]) Get Driving Directions [phone] [address] [place],  [place]  [address]  [facility] Health Urgent Care at [facility] Get Driving Directions [phone] [address], [address] [place], [place] [address]   [facility] Health Urgent Care at [facility] Get Driving Directions  [phone] [address] [place], [place] [address]   [facility]gent [facility] at [facility] Get Driving Directions [phone]  [address] [place], [place] [address]   If you have been instructed to have an in-person evaluation today at a local Urgent Care facility, please use the link below. It will take you to a list of all of our available [facility], including address, phone number and hours of operation. Please do not delay care.  [facility]  If you or a family member do not have a primary care provider, use the link  below to schedule a visit and establish care. When you choose a [facility] primary care physician or advanced practice provider, you gain a long-term partner in health. Find a Primary Care Provider  Learn more about [facility]'s in-office and virtual care options: [facility] - Get Care Now

## 2021-05-27 NOTE — Discharge Instructions (Addendum)
Continues the albuterol every 4 hours as needed for cough, shortness of breath, wheezing.  If you develop high fever, coughing up blood, shortness of breath, or any other new/concerning symptoms then return to the ER for evaluation.

## 2021-05-29 ENCOUNTER — Ambulatory Visit (INDEPENDENT_AMBULATORY_CARE_PROVIDER_SITE_OTHER): Payer: Medicaid Other | Admitting: Nurse Practitioner

## 2021-05-29 ENCOUNTER — Encounter: Payer: Self-pay | Admitting: Nurse Practitioner

## 2021-05-29 DIAGNOSIS — J40 Bronchitis, not specified as acute or chronic: Secondary | ICD-10-CM | POA: Diagnosis not present

## 2021-05-29 MED ORDER — MONTELUKAST SODIUM 10 MG PO TABS: 10.0000 mg | ORAL_TABLET | Freq: Every day | ORAL | 3 refills | Status: DC

## 2021-05-29 NOTE — Patient Instructions (Signed)
Bronchitis:  Please complete current prescriptions for azithromycin and prednisone  Stay well hydrated  Will order singulair  Follow up:  Follow up on Monday for in person eval and repeat labs

## 2021-05-29 NOTE — Progress Notes (Signed)
Virtual Visit via Telephone Note  I connected with Brenda Blankenship on 05/29/21 at  9:40 AM EDT by telephone and verified that I am speaking with the correct person using two identifiers.  Location: Patient: home Provider: office   I discussed the limitations, risks, security and privacy concerns of performing an evaluation and management service by telephone and the availability of in person appointments. I also discussed with the patient that there may be a patient responsible charge related to this service. The patient expressed understanding and agreed to proceed.   History of Present Illness:  Patient presents today for televisit / ED follow up. Patient was seen in the ED 2 days ago for bronchitis. She states that she has been sick for about 1 month now and is not improving. Overall xray and labs were normal in the ED. Patient was prescribed azithromycin and prednisone. She has already had completed a round of amoxicillin since she has been sick. She is concerned because her blood pressure was elevated in the ED, but she has been taking decongestants.  She states that she stil has post nasal drip with thick mucous, cough and congestion. Denies f/c/s, n/v/d, hemoptysis, PND, chest pain or edema.       Observations/Objective:  Vitals with BMI 05/27/2021 05/27/2021 05/27/2021  Height - - -  Weight - - -  BMI - - -  Systolic 143 139 594  Diastolic 99 106 106  Pulse 71 77 86      Assessment and Plan:  Bronchitis:  Please complete current prescriptions for azithromycin and prednisone  Stay well hydrated  Will order singulair  Follow up:  Follow up on Monday for in person eval and repeat labs    I discussed the assessment and treatment plan with the patient. The patient was provided an opportunity to ask questions and all were answered. The patient agreed with the plan and demonstrated an understanding of the instructions.   The patient was advised to call back or seek an  in-person evaluation if the symptoms worsen or if the condition fails to improve as anticipated.  I provided 23 minutes of non-face-to-face time during this encounter.   Ivonne Andrew, NP

## 2021-06-02 ENCOUNTER — Ambulatory Visit (INDEPENDENT_AMBULATORY_CARE_PROVIDER_SITE_OTHER): Payer: Medicaid Other | Admitting: Nurse Practitioner

## 2021-06-02 ENCOUNTER — Other Ambulatory Visit: Payer: Self-pay

## 2021-06-02 ENCOUNTER — Encounter: Payer: Self-pay | Admitting: Nurse Practitioner

## 2021-06-02 VITALS — BP 120/88 | HR 72 | Resp 18

## 2021-06-02 DIAGNOSIS — D72819 Decreased white blood cell count, unspecified: Secondary | ICD-10-CM | POA: Diagnosis not present

## 2021-06-02 DIAGNOSIS — J069 Acute upper respiratory infection, unspecified: Secondary | ICD-10-CM | POA: Diagnosis not present

## 2021-06-02 MED ORDER — PREDNISONE 20 MG PO TABS: 20.0000 mg | ORAL_TABLET | Freq: Every day | ORAL | 0 refills | Status: AC

## 2021-06-02 NOTE — Patient Instructions (Addendum)
URI:   Stay well hydrated  Stay active  Deep breathing exercises  May take tylenol for fever or pain  May take mucinex twice daily  Will order prednisone  Will recheck CBC    Follow up:  Follow up in 2 - 4 weeks for follow up with Dr. Andrey Campanile

## 2021-06-02 NOTE — Progress Notes (Signed)
@Patient  ID: , female    DOB: 1986/02/11, 35 y.o.   MRN: [id number]  Chief Complaint  Patient presents with   Follow-up     Referring provider: [id number]*   HPI  Patient presents today for follow-up visit.  Patient was seen in the ED on 05/27/2009 diagnosed with bronchitis.  She was prescribed azithromycin and prednisone.  Patient states that she is feeling much better since taking this medication.  She was concerned because her white blood cell count was low in the ED.  She is on immunosuppressive medications by rheumatology and we discussed that this could be causing her white blood cell count to be low.  We will recheck this today. Denies f/c/s, n/v/d, hemoptysis, PND, chest pain or edema.      Allergies  Allergen Reactions   Hydrocodone Other (See Comments)    Pt states that this medication causes her to pass out. Pt can take oxycodone    Tape Itching   Naprosyn [Naproxen] Itching and Rash    Pt can take ibuprofen and other NSAIDs    There is no immunization history for the selected administration types on file for this patient.  Past Medical History:  Diagnosis Date   Abnormal cervical Papanicolaou smear    cryo   Anemia    Anxiety    DUB (dysfunctional uterine bleeding)    Environmental allergies    HTN (hypertension)    Pregnancy induced hypertension    Preterm labor     Tobacco History: Social History   Tobacco Use  Smoking Status Every Day   Packs/day: 0.50   Years: 0.00   Pack years: 0.00   Types: Cigarettes  Smokeless Tobacco Never  Tobacco Comments   stopped with preg   Ready to quit: No Counseling given: Yes Tobacco comments: stopped with preg   Outpatient Encounter Medications as of 06/02/2021  Medication Sig   predniSONE (DELTASONE) 20 MG tablet Take 1 tablet (20 mg total) by mouth daily with breakfast for 5 days.   acetaminophen (TYLENOL) 500 MG tablet Take 2 tablets (1,000 mg total) by mouth every 8 (eight)  hours as needed. (Patient not taking: Reported on 05/27/2021)   albuterol (VENTOLIN HFA) 108 (90 Base) MCG/ACT inhaler Inhale 1-2 puffs into the lungs every 4 (four) hours as needed for wheezing or shortness of breath.   azithromycin (ZITHROMAX) 250 MG tablet Take 1 tablet (250 mg total) by mouth daily.   benzonatate (TESSALON) 100 MG capsule Take 1 capsule (100 mg total) by mouth every 8 (eight) hours. (Patient not taking: Reported on 05/27/2021)   dicyclomine (BENTYL) 10 MG capsule Take 1 capsule (10 mg total) by mouth every 6 (six) hours as needed for spasms (for abdominal pain).   gabapentin (NEURONTIN) 300 MG capsule Take 1 capsule (300 mg total) by mouth 3 (three) times daily.   hydrochlorothiazide (HYDRODIURIL) 50 MG tablet Take 1 tablet (50 mg total) by mouth daily.   hydroxychloroquine (PLAQUENIL) 200 MG tablet Take 200 mg by mouth 2 (two) times daily.   ibuprofen (ADVIL) 800 MG tablet Take 1 tablet (800 mg total) by mouth every 8 (eight) hours as needed. (Patient not taking: Reported on 05/27/2021)   losartan (COZAAR) 25 MG tablet Take 1 tablet (25 mg total) by mouth daily.   montelukast (SINGULAIR) 10 MG tablet Take 1 tablet (10 mg total) by mouth at bedtime.   promethazine-dextromethorphan (PROMETHAZINE-DM) 6.25-15 MG/5ML syrup Take 2.5 mLs by mouth 3 (three) times daily as needed  for cough.   traZODone (DESYREL) 50 MG tablet Take 1 tablet (50 mg total) by mouth at bedtime as needed for sleep.   [DISCONTINUED] omeprazole (PRILOSEC) 20 MG capsule Take 1 capsule (20 mg total) by mouth daily. (Patient not taking: Reported on 02/09/2019)   No facility-administered encounter medications on file as of 06/02/2021.     Review of Systems  Review of Systems  Constitutional: Negative.   HENT: Negative.    Cardiovascular: Negative.   Gastrointestinal: Negative.   Allergic/Immunologic: Negative.   Neurological: Negative.   Psychiatric/Behavioral: Negative.        Physical Exam  BP 120/88    Pulse 72   Resp 18   LMP 03/09/2014   SpO2 95%   Wt Readings from Last 5 Encounters:  05/27/21 168 lb (76.2 kg)  05/13/21 164 lb (74.4 kg)  04/28/21 168 lb 12.8 oz (76.6 kg)  02/24/21 163 lb (73.9 kg)  02/03/21 163 lb 6 oz (74.1 kg)     Physical Exam Vitals and nursing note reviewed.  Constitutional:      General: She is not in acute distress.    Appearance: She is well-developed.  Cardiovascular:     Rate and Rhythm: Normal rate and regular rhythm.  Pulmonary:     Effort: Pulmonary effort is normal.     Breath sounds: Normal breath sounds.  Neurological:     Mental Status: She is alert and oriented to person, place, and time.     Lab Results:  CBC    Component Value Date/Time   WBC 3.7 (L) 05/27/2021 1025   RBC 4.17 05/27/2021 1025   HGB 11.8 (L) 05/27/2021 1025   HCT 35.1 (L) 05/27/2021 1025   PLT 272 05/27/2021 1025   MCV 84.2 05/27/2021 1025   MCH 28.3 05/27/2021 1025   MCHC 33.6 05/27/2021 1025   RDW 12.9 05/27/2021 1025   LYMPHSABS 1.6 05/27/2021 1025   MONOABS 0.5 05/27/2021 1025   EOSABS 0.0 05/27/2021 1025   BASOSABS 0.0 05/27/2021 1025    BMET    Component Value Date/Time   NA 138 05/27/2021 1025   NA 136 04/28/2021 1138   K 4.0 05/27/2021 1025   CL 104 05/27/2021 1025   CO2 26 05/27/2021 1025   GLUCOSE 79 05/27/2021 1025   BUN 6 05/27/2021 1025   BUN 9 04/28/2021 1138   CREATININE 0.85 05/27/2021 1025   CALCIUM 8.5 (L) 05/27/2021 1025   GFRNONAA >60 05/27/2021 1025   GFRAA >60 05/06/2019 1348    BNP    Component Value Date/Time   BNP 97.9 01/24/2017 2308    ProBNP    Component Value Date/Time   PROBNP 245.9 (H) 02/08/2014 1343    Imaging: DG Chest 2 View  Result Date: 05/27/2021 CLINICAL DATA:  Cough. EXAM: CHEST - 2 VIEW COMPARISON:  May 13, 2021. FINDINGS: The heart size and mediastinal contours are within normal limits. Both lungs are clear. The visualized skeletal structures are unremarkable. IMPRESSION: No active  cardiopulmonary disease. Electronically Signed   By: Lupita Raider M.D.   On: 05/27/2021 10:12   DG Chest Portable 1 View  Result Date: 05/13/2021 CLINICAL DATA:  Cough and runny nose for 2 weeks EXAM: PORTABLE CHEST 1 VIEW COMPARISON:  05/06/2019 FINDINGS: The heart size and mediastinal contours are within normal limits. Both lungs are clear. The visualized skeletal structures are unremarkable. IMPRESSION: No active disease. Electronically Signed   By: Alcide Clever M.D.   On: 05/13/2021 20:17  Assessment & Plan:   Upper respiratory tract infection Stay well hydrated  Stay active  Deep breathing exercises  May take tylenol for fever or pain  May take mucinex twice daily  Will order prednisone  Will recheck CBC    Follow up:  Follow up in 2 - 4 weeks for follow up with Dr. Margarette Asal, NP 06/02/2021

## 2021-06-02 NOTE — Assessment & Plan Note (Signed)
Stay well hydrated  Stay active  Deep breathing exercises  May take tylenol for fever or pain  May take mucinex twice daily  Will order prednisone  Will recheck CBC    Follow up:  Follow up in 2 - 4 weeks for follow up with Dr. Andrey Campanile

## 2021-06-03 LAB — CBC
Hematocrit: 42.2 % (ref 34.0–46.6)
Hemoglobin: 14 g/dL (ref 11.1–15.9)
MCH: 28.5 pg (ref 26.6–33.0)
MCHC: 33.2 g/dL (ref 31.5–35.7)
MCV: 86 fL (ref 79–97)
Platelets: 294 10*3/uL (ref 150–450)
RBC: 4.91 x10E6/uL (ref 3.77–5.28)
RDW: 12.1 % (ref 11.7–15.4)
WBC: 3.8 10*3/uL (ref 3.4–10.8)

## 2021-06-26 DIAGNOSIS — Z419 Encounter for procedure for purposes other than remedying health state, unspecified: Secondary | ICD-10-CM | POA: Diagnosis not present

## 2021-07-27 DIAGNOSIS — Z419 Encounter for procedure for purposes other than remedying health state, unspecified: Secondary | ICD-10-CM | POA: Diagnosis not present

## 2021-08-03 ENCOUNTER — Other Ambulatory Visit: Payer: Self-pay | Admitting: Family

## 2021-08-03 DIAGNOSIS — I10 Essential (primary) hypertension: Secondary | ICD-10-CM

## 2021-08-27 DIAGNOSIS — Z419 Encounter for procedure for purposes other than remedying health state, unspecified: Secondary | ICD-10-CM | POA: Diagnosis not present

## 2021-09-02 ENCOUNTER — Encounter: Payer: Self-pay | Admitting: Family Medicine

## 2021-09-02 ENCOUNTER — Telehealth: Payer: BLUE CROSS/BLUE SHIELD | Admitting: Emergency Medicine

## 2021-09-02 DIAGNOSIS — M25552 Pain in left hip: Secondary | ICD-10-CM | POA: Diagnosis not present

## 2021-09-02 DIAGNOSIS — M25551 Pain in right hip: Secondary | ICD-10-CM | POA: Diagnosis not present

## 2021-09-02 MED ORDER — PREDNISONE 20 MG PO TABS: ORAL_TABLET | ORAL | 0 refills | Status: DC

## 2021-09-02 NOTE — Patient Instructions (Signed)
Tresa Endo, thank you for joining Carvel Getting, NP for today's virtual visit.  While this provider is not your primary care provider (PCP), if your PCP is located in our provider database this encounter information will be shared with them immediately following your visit.  Consent: (Patient) Brenda Blankenship provided verbal consent for this virtual visit at the beginning of the encounter.  Current Medications:  Current Outpatient Medications:    predniSONE (DELTASONE) 20 MG tablet, Take 2 tablets once a day for 4 days; then take 1 tablet once a day for 5 days., Disp: 13 tablet, Rfl: 0   acetaminophen (TYLENOL) 500 MG tablet, Take 2 tablets (1,000 mg total) by mouth every 8 (eight) hours as needed. (Patient not taking: Reported on 05/27/2021), Disp: 30 tablet, Rfl: 0   albuterol (VENTOLIN HFA) 108 (90 Base) MCG/ACT inhaler, Inhale 1-2 puffs into the lungs every 4 (four) hours as needed for wheezing or shortness of breath., Disp: 8 g, Rfl: 0   azithromycin (ZITHROMAX) 250 MG tablet, Take 1 tablet (250 mg total) by mouth daily., Disp: 4 tablet, Rfl: 0   benzonatate (TESSALON) 100 MG capsule, Take 1 capsule (100 mg total) by mouth every 8 (eight) hours. (Patient not taking: Reported on 05/27/2021), Disp: 21 capsule, Rfl: 0   dicyclomine (BENTYL) 10 MG capsule, Take 1 capsule (10 mg total) by mouth every 6 (six) hours as needed for spasms (for abdominal pain)., Disp: 120 capsule, Rfl: 0   gabapentin (NEURONTIN) 300 MG capsule, Take 1 capsule (300 mg total) by mouth 3 (three) times daily., Disp: 90 capsule, Rfl: 0   hydrochlorothiazide (HYDRODIURIL) 50 MG tablet, Take 1 tablet (50 mg total) by mouth daily., Disp: 60 tablet, Rfl: 0   hydroxychloroquine (PLAQUENIL) 200 MG tablet, Take 200 mg by mouth 2 (two) times daily., Disp: , Rfl:    ibuprofen (ADVIL) 800 MG tablet, Take 1 tablet (800 mg total) by mouth every 8 (eight) hours as needed. (Patient not taking: Reported on 05/27/2021), Disp: 21 tablet,  Rfl: 0   losartan (COZAAR) 25 MG tablet, TAKE 1 TABLET(25 MG) BY MOUTH DAILY, Disp: 90 tablet, Rfl: 0   montelukast (SINGULAIR) 10 MG tablet, Take 1 tablet (10 mg total) by mouth at bedtime., Disp: 30 tablet, Rfl: 3   promethazine-dextromethorphan (PROMETHAZINE-DM) 6.25-15 MG/5ML syrup, Take 2.5 mLs by mouth 3 (three) times daily as needed for cough., Disp: 118 mL, Rfl: 0   traZODone (DESYREL) 50 MG tablet, Take 1 tablet (50 mg total) by mouth at bedtime as needed for sleep., Disp: 30 tablet, Rfl: 1   Medications ordered in this encounter:  Meds ordered this encounter  Medications   predniSONE (DELTASONE) 20 MG tablet    Sig: Take 2 tablets once a day for 4 days; then take 1 tablet once a day for 5 days.    Dispense:  13 tablet    Refill:  0     *If you need refills on other medications prior to your next appointment, please contact your pharmacy*  Follow-Up: Call back or seek an in-person evaluation if the symptoms worsen or if the condition fails to improve as anticipated.  Other Instructions Try sending Dr. Redmond Pulling the message through [facility] to help arrange a referral to a new rheumatologist.  Continue taking your Plaquenil as prescribed.  Continue using gabapentin as needed for pain.  The prednisone prescribed today is only a temporary solution; please follow-up with Dr. Redmond Pulling and/or a rheumatologist for further care.   If  you have been instructed to have an in-person evaluation today at a local Urgent Care facility, please use the link below. It will take you to a list of all of our available [facility], including address, phone number and hours of operation. Please do not delay care.  [facility]  If you or a family member do not have a primary care provider, use the link below to schedule a visit and establish care. When you choose a [facility] primary care physician or advanced practice provider, you gain a long-term partner in health. Find a Primary  Care Provider  Learn more about 's in-office and virtual care options: [facility]

## 2021-09-02 NOTE — Progress Notes (Signed)
Virtual Visit Consent   Brenda Blankenship, you are scheduled for a virtual visit with a [facility] provider today.     Just as with appointments in the office, your consent must be obtained to participate.  Your consent will be active for this visit and any virtual visit you may have with one of our providers in the next 365 days.     If you have a MyChart account, a copy of this consent can be sent to you electronically.  All virtual visits are billed to your insurance company just like a traditional visit in the office.    As this is a virtual visit, video technology does not allow for your provider to perform a traditional examination.  This may limit your provider's ability to fully assess your condition.  If your provider identifies any concerns that need to be evaluated in person or the need to arrange testing (such as labs, EKG, etc.), we will make arrangements to do so.     Although advances in technology are sophisticated, we cannot ensure that it will always work on either your end or our end.  If the connection with a video visit is poor, the visit may have to be switched to a telephone visit.  With either a video or telephone visit, we are not always able to ensure that we have a secure connection.     I need to obtain your verbal consent now.   Are you willing to proceed with your visit today?    Brenda Blankenship has provided verbal consent on 09/02/2021 for a virtual visit (video or telephone).   Cathlyn Parsons, NP   Date: 09/02/2021 12:15 PM   Virtual Visit via Video Note   I, Cathlyn Parsons, connected with  Brenda Blankenship  ([id number], 12/05/1985) on 09/02/21 at 12:00 PM EST by a video-enabled telemedicine application and verified that I am speaking with the correct person using two identifiers.  Location: Patient: Virtual Visit Location Patient: Home Provider: Virtual Visit Location Provider: Home Office   I discussed the limitations of evaluation and management by telemedicine  and the availability of in person appointments. The patient expressed understanding and agreed to proceed.    History of Present Illness: Brenda Blankenship is a 36 y.o. who identifies as a female who was assigned female at birth, and is being seen today for suspected lupus flare.  Patient has had hip and leg pain for a long time.  [facility] in August who was evaluating her for lupus.  I do not see a definitive lupus diagnosis in her chart.  Rheumatologist placed her on Plaquenil and advised 73-month follow-up.  Patient reports she has not had success getting follow-up care with his rheumatologist.  She is taking Plaquenil as prescribed.  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  This is aggravated her chronic arthralgia and ankle edema.  She has been taking ibuprofen as needed but is trying to minimize how much she takes because she has hypertension.  She has been taking gabapentin for no relief of symptoms.  She has called her PCPs office for help/appointment but has not received a call back.  She wants to proceed with this visit in order to talk through a plan and options with someone.  She reports her symptoms are chronic in  nature and typical for her just more severe with the change in her work responsibilities.  She has been prescribed prednisone in the past for temporary improvement of her symptoms.  She is interested in something for her pain such as prednisone, and help figuring out how to get a different rheumatologist.   HPI: HPI  Problems:  Patient Active Problem List   Diagnosis Date Noted   Upper respiratory tract infection 06/02/2021   Postoperative state 04/12/2014   DUB (dysfunctional uterine bleeding) 03/08/2014   Dysmenorrhea 03/08/2014    Allergies:  Allergies  Allergen Reactions   Hydrocodone Other (See Comments)    Pt states that this  medication causes her to pass out. Pt can take oxycodone    Tape Itching   Naprosyn [Naproxen] Itching and Rash    Pt can take ibuprofen and other NSAIDs   Medications:  Current Outpatient Medications:    predniSONE (DELTASONE) 20 MG tablet, Take 2 tablets once a day for 4 days; then take 1 tablet once a day for 5 days., Disp: 13 tablet, Rfl: 0   acetaminophen (TYLENOL) 500 MG tablet, Take 2 tablets (1,000 mg total) by mouth every 8 (eight) hours as needed. (Patient not taking: Reported on 05/27/2021), Disp: 30 tablet, Rfl: 0   albuterol (VENTOLIN HFA) 108 (90 Base) MCG/ACT inhaler, Inhale 1-2 puffs into the lungs every 4 (four) hours as needed for wheezing or shortness of breath., Disp: 8 g, Rfl: 0   azithromycin (ZITHROMAX) 250 MG tablet, Take 1 tablet (250 mg total) by mouth daily., Disp: 4 tablet, Rfl: 0   benzonatate (TESSALON) 100 MG capsule, Take 1 capsule (100 mg total) by mouth every 8 (eight) hours. (Patient not taking: Reported on 05/27/2021), Disp: 21 capsule, Rfl: 0   dicyclomine (BENTYL) 10 MG capsule, Take 1 capsule (10 mg total) by mouth every 6 (six) hours as needed for spasms (for abdominal pain)., Disp: 120 capsule, Rfl: 0   gabapentin (NEURONTIN) 300 MG capsule, Take 1 capsule (300 mg total) by mouth 3 (three) times daily., Disp: 90 capsule, Rfl: 0   hydrochlorothiazide (HYDRODIURIL) 50 MG tablet, Take 1 tablet (50 mg total) by mouth daily., Disp: 60 tablet, Rfl: 0   hydroxychloroquine (PLAQUENIL) 200 MG tablet, Take 200 mg by mouth 2 (two) times daily., Disp: , Rfl:    ibuprofen (ADVIL) 800 MG tablet, Take 1 tablet (800 mg total) by mouth every 8 (eight) hours as needed. (Patient not taking: Reported on 05/27/2021), Disp: 21 tablet, Rfl: 0   losartan (COZAAR) 25 MG tablet, TAKE 1 TABLET(25 MG) BY MOUTH DAILY, Disp: 90 tablet, Rfl: 0   montelukast (SINGULAIR) 10 MG tablet, Take 1 tablet (10 mg total) by mouth at bedtime., Disp: 30 tablet, Rfl: 3   promethazine-dextromethorphan  (PROMETHAZINE-DM) 6.25-15 MG/5ML syrup, Take 2.5 mLs by mouth 3 (three) times daily as needed for cough., Disp: 118 mL, Rfl: 0   traZODone (DESYREL) 50 MG tablet, Take 1 tablet (50 mg total) by mouth at bedtime as needed for sleep., Disp: 30 tablet, Rfl: 1  Observations/Objective: Patient is well-developed, well-nourished in no acute distress.  Resting comfortably at home.  Head is normocephalic, atraumatic.  No labored breathing.  Speech is clear and coherent with logical content.  Patient is alert and oriented at baseline.    Assessment and Plan: 1. Pain of both hip joints  Prescribed prednisone 40 mg once a day for 4 days followed by 20 mg once a day for 5 days as this is what  she was given in November 2022 and it successfully temporarily helped relieve her symptoms at that time.  I suggested she try messaging her PCP via MyChart to request a referral to a different rheumatologist since the one she saw last August is not working for her.  She will do this.  She will also try to arrange follow-up with her PCP.  Follow Up Instructions: I discussed the assessment and treatment plan with the patient. The patient was provided an opportunity to ask questions and all were answered. The patient agreed with the plan and demonstrated an understanding of the instructions.  A copy of instructions were sent to the patient via MyChart unless otherwise noted below.   The patient was advised to call back or seek an in-person evaluation if the symptoms worsen or if the condition fails to improve as anticipated.  Time:  I spent 15 minutes with the patient via telehealth technology discussing the above problems/concerns.    Cathlyn Parsons, NP

## 2021-09-04 NOTE — Telephone Encounter (Signed)
Please schedule an appointment with Dr. Andrey Campanile for this patient for further evaluation. Thanks.

## 2021-09-15 ENCOUNTER — Other Ambulatory Visit: Payer: Self-pay

## 2021-09-15 ENCOUNTER — Ambulatory Visit (INDEPENDENT_AMBULATORY_CARE_PROVIDER_SITE_OTHER): Payer: Medicaid Other | Admitting: Podiatry

## 2021-09-15 ENCOUNTER — Ambulatory Visit: Payer: Medicaid Other | Admitting: Podiatry

## 2021-09-15 DIAGNOSIS — L989 Disorder of the skin and subcutaneous tissue, unspecified: Secondary | ICD-10-CM | POA: Diagnosis not present

## 2021-09-15 NOTE — Progress Notes (Signed)
° °  Subjective: 36 y.o. female presenting to the office today for follow-up evaluation regarding recurrence of symptomatic calluses to the bilateral feet.  She was last seen in the office about 2 years ago.  Patient states that she felt significant relief after debridement.  They have slowly returned over the past 2 years.  She presents for further treatment and evaluation   Past Medical History:  Diagnosis Date   Abnormal cervical Papanicolaou smear    cryo   Anemia    Anxiety    DUB (dysfunctional uterine bleeding)    Environmental allergies    HTN (hypertension)    Pregnancy induced hypertension    Preterm labor    Past Surgical History:  Procedure Laterality Date   BILATERAL SALPINGECTOMY Bilateral 04/12/2014   Procedure: BILATERAL SALPINGECTOMY;  Surgeon: Adam Phenix, MD;  Location: [facility];  Service: Gynecology;  Laterality: Bilateral;   CRYOTHERAPY     LAPAROSCOPIC TUBAL LIGATION Bilateral 11/10/2013   Procedure: bilateral LAPAROSCOPIC TUBAL LIGATION with filshie clips;  Surgeon: Freddrick March. Tenny Craw, MD;  Location: [facility];  Service: Gynecology;  Laterality: Bilateral;   VAGINAL HYSTERECTOMY N/A 04/12/2014   Procedure: HYSTERECTOMY VAGINAL;  Surgeon: Adam Phenix, MD;  Location: [facility];  Service: Gynecology;  Laterality: N/A;   Allergies  Allergen Reactions   Hydrocodone Other (See Comments)    Pt states that this medication causes her to pass out. Pt can take oxycodone    Tape Itching   Naprosyn [Naproxen] Itching and Rash    Pt can take ibuprofen and other NSAIDs     Objective:  Physical Exam General: Alert and oriented x3 in no acute distress  Dermatology: Hyperkeratotic lesion(s) present on the sub-second MPJ and plantar arch of the right foot. Pain on palpation with a central nucleated core noted. Hyperkeratotic tissue noted to the 4th interdigital webspace of the left foot. Skin is warm, dry and supple bilateral lower extremities. Negative for open lesions or  macerations.  Vascular: Palpable pedal pulses bilaterally. No edema or erythema noted. Capillary refill within normal limits.  Neurological: Epicritic and protective threshold grossly intact bilaterally.   Musculoskeletal Exam: Significant tenderness to palpation to the associated calluses  Assessment: 1. 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 Callus lesion noted to the sub-second MPJ right and right plantar arch   Plan of Care:  1. Patient evaluated. X-Rays reviewed.  2. Excisional debridement of keratoic lesion(s) bilaterally using a chisel blade was performed without incident.  3.  Areas were dressed area with light dressing. 4.  Prior to debridement of the heloma molle in the fourth interspace of the left foot the area was prepped aseptically and injection of 3 mL of 2% lidocaine was infiltrated into this area to allow for debridement.  It was very sensitive and painful to touch. 5. Patient is to return to the clinic PRN.   *Working for home for [employer] ([employer] in [place])  Felecia Shelling, DPM [facility]  Dr. Felecia Shelling, DPM    [address] [place], [place] [address]                Office [phone]  Fax [phone]

## 2021-09-24 DIAGNOSIS — Z419 Encounter for procedure for purposes other than remedying health state, unspecified: Secondary | ICD-10-CM | POA: Diagnosis not present

## 2021-10-06 ENCOUNTER — Other Ambulatory Visit: Payer: Self-pay | Admitting: Family

## 2021-10-06 DIAGNOSIS — I10 Essential (primary) hypertension: Secondary | ICD-10-CM

## 2021-10-25 DIAGNOSIS — Z419 Encounter for procedure for purposes other than remedying health state, unspecified: Secondary | ICD-10-CM | POA: Diagnosis not present

## 2021-11-24 DIAGNOSIS — Z419 Encounter for procedure for purposes other than remedying health state, unspecified: Secondary | ICD-10-CM | POA: Diagnosis not present

## 2021-12-17 ENCOUNTER — Encounter: Payer: Self-pay | Admitting: Family Medicine

## 2021-12-17 ENCOUNTER — Ambulatory Visit (INDEPENDENT_AMBULATORY_CARE_PROVIDER_SITE_OTHER): Payer: Medicaid Other | Admitting: Family Medicine

## 2021-12-17 VITALS — BP 136/87 | HR 66 | Temp 98.1°F | Resp 16 | Ht 63.0 in | Wt 145.6 lb

## 2021-12-17 DIAGNOSIS — F411 Generalized anxiety disorder: Secondary | ICD-10-CM

## 2021-12-17 DIAGNOSIS — G4700 Insomnia, unspecified: Secondary | ICD-10-CM | POA: Diagnosis not present

## 2021-12-17 DIAGNOSIS — M329 Systemic lupus erythematosus, unspecified: Secondary | ICD-10-CM | POA: Diagnosis not present

## 2021-12-17 DIAGNOSIS — Z7689 Persons encountering health services in other specified circumstances: Secondary | ICD-10-CM

## 2021-12-17 DIAGNOSIS — I10 Essential (primary) hypertension: Secondary | ICD-10-CM | POA: Diagnosis not present

## 2021-12-17 MED ORDER — HYDROXYZINE PAMOATE 25 MG PO CAPS: 25.0000 mg | ORAL_CAPSULE | Freq: Three times a day (TID) | ORAL | 0 refills | Status: DC | PRN

## 2021-12-17 MED ORDER — DICLOFENAC SODIUM 1 % EX GEL: 2.0000 g | Freq: Four times a day (QID) | CUTANEOUS | 0 refills | Status: DC

## 2021-12-17 MED ORDER — HYDROCHLOROTHIAZIDE 50 MG PO TABS: ORAL_TABLET | ORAL | 0 refills | Status: DC

## 2021-12-17 MED ORDER — LOSARTAN POTASSIUM 25 MG PO TABS: ORAL_TABLET | ORAL | 0 refills | Status: DC

## 2021-12-17 NOTE — Progress Notes (Unsigned)
Patient is here to established care. Patient would like a new referral for a Rheumatology provider. Patient is concern about spot and bumps that has appeared on her body

## 2021-12-17 NOTE — Progress Notes (Unsigned)
New Patient Office Visit  Subjective    Patient ID: Brenda Blankenship, female    DOB: Oct 25, 1985  Age: 36 y.o. MRN: [id number]  CC:  Chief Complaint  Patient presents with   Establish Care    HPI Brenda Blankenship presents to establish care ***  Outpatient Encounter Medications as of 12/17/2021  Medication Sig   dicyclomine (BENTYL) 10 MG capsule Take 1 capsule (10 mg total) by mouth every 6 (six) hours as needed for spasms (for abdominal pain).   hydrochlorothiazide (HYDRODIURIL) 50 MG tablet TAKE 1 TABLET(50 MG) BY MOUTH DAILY   losartan (COZAAR) 25 MG tablet TAKE 1 TABLET(25 MG) BY MOUTH DAILY   [DISCONTINUED] acetaminophen (TYLENOL) 500 MG tablet Take 2 tablets (1,000 mg total) by mouth every 8 (eight) hours as needed. (Patient not taking: Reported on 05/27/2021)   [DISCONTINUED] albuterol (VENTOLIN HFA) 108 (90 Base) MCG/ACT inhaler Inhale 1-2 puffs into the lungs every 4 (four) hours as needed for wheezing or shortness of breath. (Patient not taking: Reported on 12/17/2021)   [DISCONTINUED] azithromycin (ZITHROMAX) 250 MG tablet Take 1 tablet (250 mg total) by mouth daily. (Patient not taking: Reported on 12/17/2021)   [DISCONTINUED] benzonatate (TESSALON) 100 MG capsule Take 1 capsule (100 mg total) by mouth every 8 (eight) hours. (Patient not taking: Reported on 05/27/2021)   [DISCONTINUED] gabapentin (NEURONTIN) 300 MG capsule Take 1 capsule (300 mg total) by mouth 3 (three) times daily. (Patient not taking: Reported on 12/17/2021)   [DISCONTINUED] hydroxychloroquine (PLAQUENIL) 200 MG tablet Take 200 mg by mouth 2 (two) times daily. (Patient not taking: Reported on 12/17/2021)   [DISCONTINUED] ibuprofen (ADVIL) 800 MG tablet Take 1 tablet (800 mg total) by mouth every 8 (eight) hours as needed. (Patient not taking: Reported on 05/27/2021)   [DISCONTINUED] montelukast (SINGULAIR) 10 MG tablet Take 1 tablet (10 mg total) by mouth at bedtime. (Patient not taking: Reported on 12/17/2021)    [DISCONTINUED] omeprazole (PRILOSEC) 20 MG capsule Take 1 capsule (20 mg total) by mouth daily. (Patient not taking: Reported on 02/09/2019)   [DISCONTINUED] predniSONE (DELTASONE) 20 MG tablet Take 2 tablets once a day for 4 days; then take 1 tablet once a day for 5 days. (Patient not taking: Reported on 12/17/2021)   [DISCONTINUED] promethazine-dextromethorphan (PROMETHAZINE-DM) 6.25-15 MG/5ML syrup Take 2.5 mLs by mouth 3 (three) times daily as needed for cough. (Patient not taking: Reported on 12/17/2021)   [DISCONTINUED] traZODone (DESYREL) 50 MG tablet Take 1 tablet (50 mg total) by mouth at bedtime as needed for sleep.   No facility-administered encounter medications on file as of 12/17/2021.    Past Medical History:  Diagnosis Date   Abnormal cervical Papanicolaou smear    cryo   Anemia    Anxiety    DUB (dysfunctional uterine bleeding)    Environmental allergies    HTN (hypertension)    Pregnancy induced hypertension    Preterm labor     Past Surgical History:  Procedure Laterality Date   BILATERAL SALPINGECTOMY Bilateral 04/12/2014   Procedure: BILATERAL SALPINGECTOMY;  Surgeon: Adam Phenix, MD;  Location: [facility];  Service: Gynecology;  Laterality: Bilateral;   CRYOTHERAPY     LAPAROSCOPIC TUBAL LIGATION Bilateral 11/10/2013   Procedure: bilateral LAPAROSCOPIC TUBAL LIGATION with filshie clips;  Surgeon: Freddrick March. Tenny Craw, MD;  Location: [facility];  Service: Gynecology;  Laterality: Bilateral;   VAGINAL HYSTERECTOMY N/A 04/12/2014   Procedure: HYSTERECTOMY VAGINAL;  Surgeon: Adam Phenix, MD;  Location: [facility];  Service: Gynecology;  Laterality: N/A;    Family History  Problem Relation Age of Onset   Hypertension Mother    Clotting disorder Mother    Hypertension Father    Hyperlipidemia Father    Irritable bowel syndrome Father    Diabetes Maternal Grandmother    Esophageal cancer Maternal Grandfather    Cancer Paternal Grandmother        started in lung    Hypertension Paternal Grandmother    Prostate cancer Paternal Uncle     Social History   Socioeconomic History   Marital status: Married    Spouse name: Not on file   Number of children: 2   Years of education: Not on file   Highest education level: Not on file  Occupational History   Not on file  Tobacco Use   Smoking status: Every Day    Packs/day: 0.50    Years: 0.00    Pack years: 0.00    Types: Cigarettes   Smokeless tobacco: Never   Tobacco comments:    stopped with preg  Vaping Use   Vaping Use: Never used  Substance and Sexual Activity   Alcohol use: Yes    Comment: socially 1-2 per day   Drug use: No    Types: Marijuana    Comment: stopped with preg   Sexual activity: Yes    Birth control/protection: Surgical, Condom  Other Topics Concern   Not on file  Social History Narrative   Not on file   Social Determinants of Health   Financial Resource Strain: Not on file  Food Insecurity: Not on file  Transportation Needs: Not on file  Physical Activity: Not on file  Stress: Not on file  Social Connections: Not on file  Intimate Partner Violence: Not on file    ROS      Objective    BP 136/87   Pulse 66   Temp 98.1 F (36.7 C) (Oral)   Resp 16   Ht 5\' 3"  (1.6 m)   Wt 145 lb 9.6 oz (66 kg)   LMP 03/09/2014   SpO2 98%   BMI 25.79 kg/m   Physical Exam  {Labs (Optional):23779}    Assessment & Plan:   Problem List Items Addressed This Visit   None Visit Diagnoses     Encounter to establish care    -  Primary   Systemic lupus erythematosus, unspecified SLE type, unspecified organ involvement status (HCC)           No follow-ups on file.   03/11/2014, MD

## 2021-12-25 DIAGNOSIS — Z419 Encounter for procedure for purposes other than remedying health state, unspecified: Secondary | ICD-10-CM | POA: Diagnosis not present

## 2021-12-28 ENCOUNTER — Telehealth: Payer: Medicaid Other | Admitting: Nurse Practitioner

## 2021-12-28 DIAGNOSIS — R079 Chest pain, unspecified: Secondary | ICD-10-CM

## 2021-12-28 NOTE — Patient Instructions (Signed)
Nonspecific Chest Pain Chest pain can be caused by many different conditions. Some causes of chest pain can be life-threatening. These will require treatment right away. Serious causes of chest pain include: Heart attack. A tear in the body's main blood vessel. Redness and swelling (inflammation) around your heart. Blood clot in your lungs. Other causes of chest pain may not be so serious. These include: Heartburn. Anxiety or stress. Damage to bones or muscles in your chest. Lung infections. Chest pain can feel like: Pain or discomfort in your chest. Crushing, pressure, aching, or squeezing pain. Burning or tingling. Dull or sharp pain that is worse when you move, cough, or take a deep breath. Pain or discomfort that is also felt in your back, neck, jaw, shoulder, or arm, or pain that spreads to any of these areas. It is hard to know whether your pain is caused by something that is serious or something that is not so serious. So it is important to see your doctor right away if you have chest pain. Follow these instructions at home: Medicines Take over-the-counter and prescription medicines only as told by your doctor. If you were prescribed an antibiotic medicine, take it as told by your doctor. Do not stop taking the antibiotic even if you start to feel better. Lifestyle  Rest as told by your doctor. Do not use any products that contain nicotine or tobacco, such as cigarettes, e-cigarettes, and chewing tobacco. If you need help quitting, ask your doctor. Do not drink alcohol. Make lifestyle changes as told by your doctor. These may include: Getting regular exercise. Ask your doctor what activities are safe for you. Eating a heart-healthy diet. A diet and nutrition specialist (dietitian) can help you to learn healthy eating options. Staying at a healthy weight. Treating diabetes or high blood pressure, if needed. Lowering your stress. Activities such as yoga and relaxation techniques  can help. General instructions Pay attention to any changes in your symptoms. Tell your doctor about them or any new symptoms. Avoid any activities that cause chest pain. Keep all follow-up visits as told by your doctor. This is important. You may need more testing if your chest pain does not go away. Contact a doctor if: Your chest pain does not go away. You feel depressed. You have a fever. Get help right away if: Your chest pain is worse. You have a cough that gets worse, or you cough up blood. You have very bad (severe) pain in your belly (abdomen). You pass out (faint). You have either of these for no clear reason: Sudden chest discomfort. Sudden discomfort in your arms, back, neck, or jaw. You have shortness of breath at any time. You suddenly start to sweat, or your skin gets clammy. You feel sick to your stomach (nauseous). You throw up (vomit). You suddenly feel lightheaded or dizzy. You feel very weak or tired. Your heart starts to beat fast, or it feels like it is skipping beats. These symptoms may be an emergency. Do not wait to see if the symptoms will go away. Get medical help right away. Call your local emergency services (911 in the [place] Do not drive yourself to the hospital. Summary Chest pain can be caused by many different conditions. The cause may be serious and need treatment right away. If you have chest pain, see your doctor right away. Follow your doctor's instructions for taking medicines and making lifestyle changes. Keep all follow-up visits as told by your doctor. This includes visits for any further   testing if your chest pain does not go away. Be sure to know the signs that show that your condition has become worse. Get help right away if you have these symptoms. This information is not intended to replace advice given to you by your health care provider. Make sure you discuss any questions you have with your health care provider. Document Revised:  09/26/2020 Document Reviewed: 09/26/2020 Elsevier Patient Education  2023 [employer]  

## 2021-12-28 NOTE — Progress Notes (Signed)
Virtual Visit Consent   Brenda Blankenship, you are scheduled for a virtual visit with Mary-Margaret Daphine Deutscher, FNP, a [facility] provider, today.     Just as with appointments in the office, your consent must be obtained to participate.  Your consent will be active for this visit and any virtual visit you may have with one of our providers in the next 365 days.     If you have a MyChart account, a copy of this consent can be sent to you electronically.  All virtual visits are billed to your insurance company just like a traditional visit in the office.    As this is a virtual visit, video technology does not allow for your provider to perform a traditional examination.  This may limit your provider's ability to fully assess your condition.  If your provider identifies any concerns that need to be evaluated in person or the need to arrange testing (such as labs, EKG, etc.), we will make arrangements to do so.     Although advances in technology are sophisticated, we cannot ensure that it will always work on either your end or our end.  If the connection with a video visit is poor, the visit may have to be switched to a telephone visit.  With either a video or telephone visit, we are not always able to ensure that we have a secure connection.     I need to obtain your verbal consent now.   Are you willing to proceed with your visit today? YES   Brenda Blankenship has provided verbal consent on 12/28/2021 for a virtual visit (video or telephone).   Mary-Margaret Daphine Deutscher, FNP   Date: 12/28/2021 10:19 AM   Virtual Visit via Video Note   I, Mary-Margaret Daphine Deutscher, connected with Brenda Blankenship ([id number], 1986/03/11) on 12/28/21 at 10:15 AM EDT by a video-enabled telemedicine application and verified that I am speaking with the correct person using two identifiers.  Location: Patient: Virtual Visit Location Patient: Home Provider: Virtual Visit Location Provider: Mobile   I discussed the limitations of  evaluation and management by telemedicine and the availability of in person appointments. The patient expressed understanding and agreed to proceed.    History of Present Illness: Brenda Blankenship is a 36 y.o. who identifies as a female who was assigned female at birth, and is being seen today for chest pain.  HPI: Patient has been having chest pain the last 2 days. Described as burning , stabbing and heavy. Radiates to her back. Causes SOB and nausea. Pain is intermittent and unrelated to eating. Pain started out as 2-3/10 and now is 8-9/10. Nothing seems to help the pain and nothing seems to make it worse. She has history of hypertension and her blood pressure has been running high.   Review of Systems  Constitutional: Negative.   Respiratory:  Negative for cough.   Cardiovascular:  Positive for chest pain. Negative for palpitations, orthopnea and leg swelling.  Gastrointestinal:  Positive for nausea.  Genitourinary: Negative.    Problems:  Patient Active Problem List   Diagnosis Date Noted   Upper respiratory tract infection 06/02/2021   Postoperative state 04/12/2014   DUB (dysfunctional uterine bleeding) 03/08/2014   Dysmenorrhea 03/08/2014    Allergies:  Allergies  Allergen Reactions   Hydrocodone Other (See Comments)    Pt states that this medication causes her to pass out. Pt can take oxycodone    Tape Rash and Itching    IV  tape   Naprosyn [Naproxen] Itching and Rash    Pt can take ibuprofen and other NSAIDs   Medications:  Current Outpatient Medications:    diclofenac Sodium (VOLTAREN) 1 % GEL, Apply 2 g topically 4 (four) times daily., Disp: 350 g, Rfl: 0   dicyclomine (BENTYL) 10 MG capsule, Take 1 capsule (10 mg total) by mouth every 6 (six) hours as needed for spasms (for abdominal pain)., Disp: 120 capsule, Rfl: 0   hydrochlorothiazide (HYDRODIURIL) 50 MG tablet, TAKE 1 TABLET(50 MG) BY MOUTH DAILY, Disp: 90 tablet, Rfl: 0   hydrOXYzine (VISTARIL) 25 MG capsule,  Take 1 capsule (25 mg total) by mouth every 8 (eight) hours as needed for anxiety., Disp: 30 capsule, Rfl: 0   losartan (COZAAR) 25 MG tablet, TAKE 1 TABLET(25 MG) BY MOUTH DAILY, Disp: 90 tablet, Rfl: 0  Observations/Objective: Patient is well-developed, well-nourished in no acute distress.  Resting comfortably  at home.  Head is normocephalic, atraumatic.  No labored breathing.  Speech is clear and coherent with logical content.  Patient is alert and oriented at baseline.    Assessment and Plan:  Brenda Blankenship in today with chief complaint of chest pain  1. Chest pain, unspecified type Could be heart or gastric. More likely gastric or gall bladder in nature. Recommended to go to urgent care to have EKG and make sure her heart is good. Needs some blood work and possible abdominal scan. Suggested  to stay NPO until evaluated    Follow Up Instructions: I discussed the assessment and treatment plan with the patient. The patient was provided an opportunity to ask questions and all were answered. The patient agreed with the plan and demonstrated an understanding of the instructions.  A copy of instructions were sent to the patient via MyChart.  The patient was advised to call back or seek an in-person evaluation if the symptoms worsen or if the condition fails to improve as anticipated.  Time:  I spent 8 minutes with the patient via telehealth technology discussing the above problems/concerns.    Mary-Margaret Daphine Deutscher, FNP

## 2021-12-29 ENCOUNTER — Emergency Department (HOSPITAL_BASED_OUTPATIENT_CLINIC_OR_DEPARTMENT_OTHER)
Admission: EM | Admit: 2021-12-29 | Discharge: 2021-12-29 | Disposition: A | Discharge status: Home / Self Care | Payer: Medicaid Other | Attending: Emergency Medicine | Admitting: Emergency Medicine

## 2021-12-29 ENCOUNTER — Other Ambulatory Visit: Payer: Self-pay

## 2021-12-29 ENCOUNTER — Ambulatory Visit: Payer: Self-pay

## 2021-12-29 ENCOUNTER — Emergency Department (HOSPITAL_BASED_OUTPATIENT_CLINIC_OR_DEPARTMENT_OTHER): Payer: Medicaid Other

## 2021-12-29 ENCOUNTER — Encounter (HOSPITAL_BASED_OUTPATIENT_CLINIC_OR_DEPARTMENT_OTHER): Payer: Self-pay | Admitting: Obstetrics and Gynecology

## 2021-12-29 DIAGNOSIS — M546 Pain in thoracic spine: Secondary | ICD-10-CM | POA: Insufficient documentation

## 2021-12-29 DIAGNOSIS — R197 Diarrhea, unspecified: Secondary | ICD-10-CM | POA: Diagnosis not present

## 2021-12-29 DIAGNOSIS — Z79899 Other long term (current) drug therapy: Secondary | ICD-10-CM | POA: Diagnosis not present

## 2021-12-29 DIAGNOSIS — R42 Dizziness and giddiness: Secondary | ICD-10-CM | POA: Insufficient documentation

## 2021-12-29 DIAGNOSIS — I10 Essential (primary) hypertension: Secondary | ICD-10-CM | POA: Diagnosis not present

## 2021-12-29 DIAGNOSIS — R1031 Right lower quadrant pain: Secondary | ICD-10-CM | POA: Diagnosis not present

## 2021-12-29 DIAGNOSIS — F17210 Nicotine dependence, cigarettes, uncomplicated: Secondary | ICD-10-CM | POA: Insufficient documentation

## 2021-12-29 DIAGNOSIS — R9431 Abnormal electrocardiogram [ECG] [EKG]: Secondary | ICD-10-CM | POA: Diagnosis not present

## 2021-12-29 DIAGNOSIS — R1011 Right upper quadrant pain: Secondary | ICD-10-CM | POA: Diagnosis not present

## 2021-12-29 DIAGNOSIS — R110 Nausea: Secondary | ICD-10-CM | POA: Diagnosis not present

## 2021-12-29 DIAGNOSIS — R109 Unspecified abdominal pain: Secondary | ICD-10-CM | POA: Diagnosis not present

## 2021-12-29 HISTORY — DX: Reserved for concepts with insufficient information to code with codable children: IMO0002

## 2021-12-29 HISTORY — DX: Systemic lupus erythematosus, unspecified: M32.9

## 2021-12-29 LAB — COMPREHENSIVE METABOLIC PANEL
ALT: 15 U/L (ref 0–44)
AST: 29 U/L (ref 15–41)
Albumin: 4.7 g/dL (ref 3.5–5.0)
Alkaline Phosphatase: 60 U/L (ref 38–126)
Anion gap: 12 (ref 5–15)
BUN: 9 mg/dL (ref 6–20)
CO2: 23 mmol/L (ref 22–32)
Calcium: 9.9 mg/dL (ref 8.9–10.3)
Chloride: 97 mmol/L — ABNORMAL LOW (ref 98–111)
Creatinine, Ser: 0.86 mg/dL (ref 0.44–1.00)
GFR, Estimated: >60 mL/min (ref >60)
Glucose, Bld: 116 mg/dL — ABNORMAL HIGH (ref 70–99)
Potassium: 3.7 mmol/L (ref 3.5–5.1)
Sodium: 132 mmol/L — ABNORMAL LOW (ref 135–145)
Total Bilirubin: 1.7 mg/dL — ABNORMAL HIGH (ref 0.3–1.2)
Total Protein: 8 g/dL (ref 6.5–8.1)

## 2021-12-29 LAB — CBC
HCT: 44.7 % (ref 36.0–46.0)
Hemoglobin: 15.1 g/dL — ABNORMAL HIGH (ref 12.0–15.0)
MCH: 29.2 pg (ref 26.0–34.0)
MCHC: 33.8 g/dL (ref 30.0–36.0)
MCV: 86.3 fL (ref 80.0–100.0)
Platelets: 230 10*3/uL (ref 150–400)
RBC: 5.18 MIL/uL — ABNORMAL HIGH (ref 3.87–5.11)
RDW: 13.2 % (ref 11.5–15.5)
WBC: 3.7 10*3/uL — ABNORMAL LOW (ref 4.0–10.5)
nRBC: 0 % (ref 0.0–0.2)

## 2021-12-29 LAB — LIPASE, BLOOD: Lipase: 17 U/L (ref 11–51)

## 2021-12-29 LAB — TROPONIN I (HIGH SENSITIVITY): Troponin I (High Sensitivity): 4 ng/L (ref <18)

## 2021-12-29 MED ORDER — OXYCODONE-ACETAMINOPHEN 5-325 MG PO TABS: 1.0000 | ORAL_TABLET | Freq: Four times a day (QID) | ORAL | 0 refills | Status: DC | PRN

## 2021-12-29 MED ORDER — IOHEXOL 300 MG/ML  SOLN
100.0000 mL | Freq: Once | INTRAMUSCULAR | Status: AC | PRN
  Administered 2021-12-29: 80 mL via INTRAVENOUS

## 2021-12-29 MED ORDER — MORPHINE SULFATE (PF) 4 MG/ML IV SOLN
4.0000 mg | Freq: Once | INTRAVENOUS | Status: AC
  Administered 2021-12-29: 4 mg via INTRAVENOUS
  Filled 2021-12-29: qty 1

## 2021-12-29 MED ORDER — LACTATED RINGERS IV BOLUS
1000.0000 mL | Freq: Once | INTRAVENOUS | Status: AC
  Administered 2021-12-29: 1000 mL via INTRAVENOUS

## 2021-12-29 NOTE — ED Triage Notes (Signed)
Patient reports a rapid drop in weight, dizziness, fevers, and pain in her lower abdomen to her back that radiates to her right shoulder. Patient did a virtual visit. Patient reports she had an EKG, Urine, blood work and scan.

## 2021-12-29 NOTE — Telephone Encounter (Signed)
Has been noted 

## 2021-12-29 NOTE — Discharge Instructions (Addendum)
Your CT abdomen pelvis was negative for acute abnormalities, full read below: IMPRESSION:  1.  No CT evidence of acute abdominal/pelvic process.     2.  Normal appendix.  No evidence of colitis or diverticulitis.     3.  No evidence of nephrolithiasis or hydronephrosis.     4.  Additional findings as above.

## 2021-12-29 NOTE — Telephone Encounter (Signed)
  Chief Complaint: abdominal pain 8/10 - right side Symptoms: Pain, dizziness, yellow stringy stools, right Shoulder pain, sweats without fever Frequency: 1 week Pertinent Negatives: Patient denies fever Disposition: [x] ED /[] Urgent Care (no appt availability in office) / [] Appointment(In office/virtual)/ []  [facility]/ [] Home Care/ [] Refused Recommended Disposition /[] [facility]/ []  Follow-up with PCP Additional Notes: PT was seen by virtual provider and went to an UC. UC was able to test her for heart issues and did lab work not yet resulted. Pt still in pain of 8/10. Pt will go to ED for pain management and further testing. PT will get Lab results for UC. Reason for Disposition  [1] MILD-MODERATE pain AND [2] constant AND [3] present > 2 hours  Answer Assessment - Initial Assessment Questions 1. LOCATION: "Where does it hurt?"      Right side abdominal pain 2. RADIATION: "Does the pain shoot anywhere else?" (e.g., chest, back)     Right shoulder 3. ONSET: "When did the pain begin?" (e.g., minutes, hours or days ago)      1 week 4. SUDDEN: "Gradual or sudden onset?"      gradual 5. PATTERN "Does the pain come and go, or is it constant?"    - If constant: "Is it getting better, staying the same, or worsening?"      (Note: Constant means the pain never goes away completely; most serious pain is constant and it progresses)     - If intermittent: "How long does it last?" "Do you have pain now?"     (Note: Intermittent means the pain goes away completely between bouts)     Constant in abdomen and intermittent in shoulder 6. SEVERITY: "How bad is the pain?"  (e.g., Scale 1-10; mild, moderate, or severe)   - MILD (1-3): doesn't interfere with normal activities, abdomen soft and not tender to touch    - MODERATE (4-7): interferes with normal activities or awakens from sleep, abdomen tender to touch    - SEVERE (8-10): excruciating pain, doubled over, unable to do any  normal activities      8/10 7. RECURRENT SYMPTOM: "Have you ever had this type of stomach pain before?" If Yes, ask: "When was the last time?" and "What happened that time?"      no 8. CAUSE: "What do you think is causing the stomach pain?"     Gall bladder 9. RELIEVING/AGGRAVATING FACTORS: "What makes it better or worse?" (e.g., movement, antacids, bowel movement)     No - BM 10. OTHER SYMPTOMS: "Do you have any other symptoms?" (e.g., back pain, diarrhea, fever, urination pain, vomiting)       Dizzy - yellow green stringy stool 11. PREGNANCY: "Is there any chance you are pregnant?" "When was your last menstrual period?"       no  Protocols used: Abdominal Pain - [facility]

## 2021-12-29 NOTE — ED Provider Notes (Signed)
[facility] EMERGENCY DEPT Provider Note   CSN: [id number] Arrival date & time: 12/29/21  1636     History  Chief Complaint  Patient presents with   Dizziness    Brenda Blankenship is a 36 y.o. female with history of RA, hypertension who presents to the ED for evaluation of right lower quadrant pain radiating to the right back along with dizziness described as lightheadedness.  She also has subjective fever at home although when she takes her temperature it is normal.  She is complaining of lower right abdominal pain that radiates into her right mid back.  She also states that her stool appears mucousy and "stringy".  Patient had telehealth appointment with her PCP yesterday and told her that she was having right-sided chest pain radiating into her back.  PCP believed that her symptoms were likely from a gallbladder pathology and referred her to the emergency department.  When asked today, patient states that she is not having chest pain.  She went to urgent care today and had EKG done along with urinalysis.  UA showed bilirubin in her urine without evidence of infection.  EKG was normal.  Patient came to the emergency department afterwards for evaluation.  She denies nausea, vomiting.    Dizziness     Home Medications Prior to Admission medications   Medication Sig Start Date End Date Taking? Authorizing Provider  diclofenac Sodium (VOLTAREN) 1 % GEL Apply 2 g topically 4 (four) times daily. 12/17/21   Georganna Skeans, MD  dicyclomine (BENTYL) 10 MG capsule Take 1 capsule (10 mg total) by mouth every 6 (six) hours as needed for spasms (for abdominal pain). 02/03/21 12/17/21  Jenel Lucks, MD  hydrochlorothiazide (HYDRODIURIL) 50 MG tablet TAKE 1 TABLET(50 MG) BY MOUTH DAILY 12/17/21   Georganna Skeans, MD  hydrOXYzine (VISTARIL) 25 MG capsule Take 1 capsule (25 mg total) by mouth every 8 (eight) hours as needed for anxiety. 12/17/21   Georganna Skeans, MD  losartan (COZAAR) 25 MG  tablet TAKE 1 TABLET(25 MG) BY MOUTH DAILY 12/17/21   Georganna Skeans, MD  omeprazole (PRILOSEC) 20 MG capsule Take 1 capsule (20 mg total) by mouth daily. Patient not taking: Reported on 02/09/2019 01/25/17 02/09/19  Muthersbaugh, Dahlia Client, PA-C      Allergies    Hydrocodone, Tape, and Naprosyn [naproxen]    Review of Systems   Review of Systems  Neurological:  Positive for dizziness.   Physical Exam Updated Vital Signs BP (!) 141/103   Pulse 83   Temp 98.2 F (36.8 C)   Resp 16   Ht 5\' 3"  (1.6 m)   Wt 62.4 kg   LMP 03/09/2014   SpO2 100%   BMI 24.37 kg/m  Physical Exam Vitals and nursing note reviewed.  Constitutional:      General: She is not in acute distress.    Appearance: She is not ill-appearing.  HENT:     Head: Atraumatic.  Eyes:     Conjunctiva/sclera: Conjunctivae normal.  Cardiovascular:     Rate and Rhythm: Normal rate and regular rhythm.     Pulses: Normal pulses.     Heart sounds: No murmur heard. Pulmonary:     Effort: Pulmonary effort is normal. No respiratory distress.     Breath sounds: Normal breath sounds.  Abdominal:     General: Abdomen is flat. There is no distension.     Palpations: Abdomen is soft.     Tenderness: There is no abdominal tenderness.  Comments: Positive right lower quadrant tenderness.  Abdomen is otherwise soft, nondistended.  Negative CVA tenderness bilaterally.    Musculoskeletal:        General: Normal range of motion.     Cervical back: Normal range of motion.       Back:     Comments: Reproducible right mid to lower back tenderness not quite at the level of the CVA.  No obvious bruising or deformity  Skin:    General: Skin is warm and dry.     Capillary Refill: Capillary refill takes less than 2 seconds.  Neurological:     General: No focal deficit present.     Mental Status: She is alert.  Psychiatric:        Mood and Affect: Mood normal.    ED Results / Procedures / Treatments   Labs (all labs ordered are  listed, but only abnormal results are displayed) Labs Reviewed  COMPREHENSIVE METABOLIC PANEL - Abnormal; Notable for the following components:      Result Value   Sodium 132 (*)    Chloride 97 (*)    Glucose, Bld 116 (*)    Total Bilirubin 1.7 (*)    All other components within normal limits  CBC - Abnormal; Notable for the following components:   WBC 3.7 (*)    RBC 5.18 (*)    Hemoglobin 15.1 (*)    All other components within normal limits  LIPASE, BLOOD    EKG None  Radiology No results found.  Procedures Procedures    Medications Ordered in ED Medications  lactated ringers bolus 1,000 mL (has no administration in time range)  morphine (PF) 4 MG/ML injection 4 mg (has no administration in time range)    ED Course/ Medical Decision Making/ A&P                           Medical Decision Making Amount and/or Complexity of Data Reviewed Labs: ordered. Radiology: ordered.  Risk Prescription drug management.   Social determinants of health:  Social History   Socioeconomic History   Marital status: Married    Spouse name: Not on file   Number of children: 2   Years of education: Not on file   Highest education level: Not on file  Occupational History   Not on file  Tobacco Use   Smoking status: Every Day    Packs/day: 0.30    Years: 13.00    Pack years: 3.90    Types: Cigarettes   Smokeless tobacco: Never   Tobacco comments:    stopped with preg  Vaping Use   Vaping Use: Never used  Substance and Sexual Activity   Alcohol use: Yes    Alcohol/week: 14.0 standard drinks    Types: 14 Standard drinks or equivalent per week    Comment: 1-2 per day   Drug use: No    Types: Marijuana    Comment: stopped with preg   Sexual activity: Yes    Birth control/protection: Surgical, Condom  Other Topics Concern   Not on file  Social History Narrative   Not on file   Social Determinants of Health   Financial Resource Strain: Not on file  Food  Insecurity: Not on file  Transportation Needs: Not on file  Physical Activity: Not on file  Stress: Not on file  Social Connections: Not on file  Intimate Partner Violence: Not on file     Initial impression:  This patient presents to the ED for concern of right-sided abdominal pain, this involves an extensive number of treatment options, and is a complaint that carries with it a high risk of complications and morbidity.   Differentials include cholecystitis, ACS, appendicitis, enteritis.   Comorbidities affecting care:  RA  Additional history obtained: Urgent care visit, family med visits  Lab Tests  I Ordered, reviewed, and interpreted labs and EKG.  The pertinent results include:  CMP, CBC unremarkable Lipase normal Pending troponin  Imaging Studies ordered:  I ordered imaging studies including  Pending CT abdomen I independently visualized and interpreted imaging and I agree with the radiologist interpretation.    Medicines ordered and prescription drug management:  I ordered medication including: LR bolus 1 L Morphine 4 mg IV Reevaluation of the patient after these medicines showed that the patient improved I have reviewed the patients home medicines and have made adjustments as needed    ED Course/Re-evaluation: 36 year old female presents to the ED for evaluation of right-sided abdominal pain.  Vitals without significant abnormality.  On exam, patient has right lower quadrant tenderness.  Negative Murphy sign.  She also has reproducible tenderness just deep to her area of tenderness in the right lower quadrant on the right mid to lower back.  No suprapubic tenderness.  Physical exam otherwise benign.  CMP, CBC and lipase without acute findings.  Pending troponin and CT abdomen pelvis.  Patient was given pain meds and fluids. Patient care handed off to lossing MD at shift change who will monitor patient's CT abdomen and determine dispo   Final Clinical  Impression(s) / ED Diagnoses Final diagnoses:  None    Rx / DC Orders ED Discharge Orders     None         Delight OvensConklin, Jovann Luse R, PA-C 12/29/21 2243    Ernie AvenaLawsing, James, MD 12/30/21 [phone]

## 2021-12-29 NOTE — ED Notes (Signed)
Pt agreeable with d/c plan as discussed by provider- this nurse has verbally reinforced d/c instructions and provided pt with written copy- pt acknowledges verbal understanding and denies any additional questions, concerns, needs- pt ambulatory independently with steady gait at d/c; no distress

## 2021-12-29 NOTE — ED Notes (Signed)
Patient transported to CT via stretcher.

## 2021-12-29 NOTE — ED Notes (Signed)
Pt now returned from CT dept via stretcher- pt remains awake and alert; no acute changes observed

## 2021-12-30 ENCOUNTER — Ambulatory Visit: Payer: Self-pay | Admitting: *Deleted

## 2021-12-30 NOTE — Telephone Encounter (Signed)
Summary: ER follow up / lower abdominla pain / dizzy when standing   In a lot of pain over the weekend / pt ended up at the ER/ her blood test was off and her protein levels were off/ and they advised her it was not life threatening but they are not sure what is going on with her/ she was advised to call and follow up with her PCP or nurse/ pt is still in pain and they ruled out kidney stones / she was prescribed meds/ lower abdominal pain and walking and standing difficult and dizzy when standing / please advise      Reason for Disposition  [1] MODERATE pain (e.g., interferes with normal activities) AND [2] pain comes and goes (cramps) AND [3] present > 24 hours  (Exception: pain with Vomiting or Diarrhea - see that Guideline)  Answer Assessment - Initial Assessment Questions 1. LOCATION: "Where does it hurt?"      LLQ pain 2. RADIATION: "Does the pain shoot anywhere else?" (e.g., chest, back)     Into back 3. ONSET: "When did the pain begin?" (e.g., minutes, hours or days ago)      Several weeks- patient has been in office 4. SUDDEN: "Gradual or sudden onset?"       5. PATTERN "Does the pain come and go, or is it constant?"    - If constant: "Is it getting better, staying the same, or worsening?"      (Note: Constant means the pain never goes away completely; most serious pain is constant and it progresses)     - If intermittent: "How long does it last?" "Do you have pain now?"     (Note: Intermittent means the pain goes away completely between bouts)     Constant- move intense with movement 6. SEVERITY: "How bad is the pain?"  (e.g., Scale 1-10; mild, moderate, or severe)   - MILD (1-3): doesn't interfere with normal activities, abdomen soft and not tender to touch    - MODERATE (4-7): interferes with normal activities or awakens from sleep, abdomen tender to touch    - SEVERE (8-10): excruciating pain, doubled over, unable to do any normal activities      moderate 7. RECURRENT SYMPTOM:  "Have you ever had this type of stomach pain before?" If Yes, ask: "When was the last time?" and "What happened that time?"      Yes- ongoing abdominal pain- but not like this 8. CAUSE: "What do you think is causing the stomach pain?"     unsure 9. RELIEVING/AGGRAVATING FACTORS: "What makes it better or worse?" (e.g., movement, antacids, bowel movement)     no 10. OTHER SYMPTOMS: "Do you have any other symptoms?" (e.g., back pain, diarrhea, fever, urination pain, vomiting)       Back pain, leg swelling, weight loss- unable to eat, stools off 11. PREGNANCY: "Is there any chance you are pregnant?" "When was your last menstrual period?"  Protocols used: Abdominal Pain - [facility]

## 2021-12-30 NOTE — Telephone Encounter (Signed)
  Chief Complaint: abdominal pian- LLQ Symptoms: LLQ pain- into back, weight loss- hard to eat- no appetite, leg swelling, dizziness Frequency: several weeks- patient has been seen at office, UC,ED for this- requesting follow up this week Pertinent Negatives: Patient denies vomiting, fever Disposition: [] ED /[] Urgent Care (no appt availability in office) / [] Appointment(In office/virtual)/ []  [facility]/ [] Home Care/ [x] Refused Recommended Disposition /[] [facility]/ []  Follow-up with PCP Additional Notes: Patient needs hospital follow up- will see NP if MD out of office- requesting visit this week. Patient was seen at ED and released. Advised she needs office follow up. Does not want to go to mobile unit.

## 2022-01-01 NOTE — Telephone Encounter (Signed)
I tried calling the patient but the call could not be completed. I sent the patient a MyChart message informing her that Dr. Andrey Campanile will not be back into the clinic until the latter part of next week, but that she has no openings. I provided her with the address of where the [facility] bus is today and encouraged her to go see the provider there.

## 2022-01-24 DIAGNOSIS — Z419 Encounter for procedure for purposes other than remedying health state, unspecified: Secondary | ICD-10-CM | POA: Diagnosis not present

## 2022-01-29 ENCOUNTER — Encounter: Payer: Self-pay | Admitting: Family Medicine

## 2022-01-29 ENCOUNTER — Ambulatory Visit (INDEPENDENT_AMBULATORY_CARE_PROVIDER_SITE_OTHER): Payer: Medicaid Other | Admitting: Family Medicine

## 2022-01-29 VITALS — BP 138/89 | HR 60 | Temp 98.1°F | Resp 16 | Wt 142.0 lb

## 2022-01-29 DIAGNOSIS — G894 Chronic pain syndrome: Secondary | ICD-10-CM

## 2022-01-29 DIAGNOSIS — R1031 Right lower quadrant pain: Secondary | ICD-10-CM | POA: Diagnosis not present

## 2022-01-29 DIAGNOSIS — F411 Generalized anxiety disorder: Secondary | ICD-10-CM

## 2022-01-29 MED ORDER — DULOXETINE HCL 20 MG PO CPEP: 20.0000 mg | ORAL_CAPSULE | Freq: Every day | ORAL | 3 refills | Status: AC

## 2022-01-29 NOTE — Progress Notes (Signed)
Established Patient Office Visit  Subjective    Patient ID: Brenda Blankenship, female    DOB: [birth year]  Age: 36 y.o. MRN: [id number]  CC: No chief complaint on file.   HPI ASIAH Blankenship presents for follow up of chronic pain as well as anxiety. She also complaints of some right sided lower abdominal pain for which she has seen GI before.    Outpatient Encounter Medications as of 01/29/2022  Medication Sig   diclofenac Sodium (VOLTAREN) 1 % GEL Apply 2 g topically 4 (four) times daily.   DULoxetine (CYMBALTA) 20 MG capsule Take 1 capsule (20 mg total) by mouth daily.   gabapentin (NEURONTIN) 300 MG capsule Take by mouth.   hydrochlorothiazide (HYDRODIURIL) 50 MG tablet TAKE 1 TABLET(50 MG) BY MOUTH DAILY   hydrOXYzine (VISTARIL) 25 MG capsule Take 1 capsule (25 mg total) by mouth every 8 (eight) hours as needed for anxiety.   ibuprofen (ADVIL) 800 MG tablet Take by mouth.   losartan (COZAAR) 25 MG tablet TAKE 1 TABLET(25 MG) BY MOUTH DAILY   ondansetron (ZOFRAN-ODT) 4 MG disintegrating tablet Take by mouth.   oxyCODONE-acetaminophen (PERCOCET/ROXICET) 5-325 MG tablet Take 1 tablet by mouth every 6 (six) hours as needed for severe pain.   [DISCONTINUED] dicyclomine (BENTYL) 10 MG capsule Take by mouth.   [DISCONTINUED] losartan (COZAAR) 25 MG tablet Take 1 tablet by mouth daily.   dicyclomine (BENTYL) 10 MG capsule Take 1 capsule (10 mg total) by mouth every 6 (six) hours as needed for spasms (for abdominal pain).   [DISCONTINUED] omeprazole (PRILOSEC) 20 MG capsule Take 1 capsule (20 mg total) by mouth daily. (Patient not taking: Reported on 02/09/2019)   [DISCONTINUED] ondansetron (ZOFRAN-ODT) 4 MG disintegrating tablet Take 4 mg by mouth every 8 (eight) hours as needed.   No facility-administered encounter medications on file as of 01/29/2022.    Past Medical History:  Diagnosis Date   Abnormal cervical Papanicolaou smear    cryo   Anemia    Anxiety    DUB (dysfunctional uterine  bleeding)    Environmental allergies    HTN (hypertension)    Lupus (HCC)    Pregnancy induced hypertension    Preterm labor     Past Surgical History:  Procedure Laterality Date   BILATERAL SALPINGECTOMY Bilateral 04/12/2014   Procedure: BILATERAL SALPINGECTOMY;  Surgeon: Adam Phenix, MD;  Location: [facility];  Service: Gynecology;  Laterality: Bilateral;   CRYOTHERAPY     LAPAROSCOPIC TUBAL LIGATION Bilateral 11/10/2013   Procedure: bilateral LAPAROSCOPIC TUBAL LIGATION with filshie clips;  Surgeon: Freddrick March. Tenny Craw, MD;  Location: [facility];  Service: Gynecology;  Laterality: Bilateral;   VAGINAL HYSTERECTOMY N/A 04/12/2014   Procedure: HYSTERECTOMY VAGINAL;  Surgeon: Adam Phenix, MD;  Location: [facility];  Service: Gynecology;  Laterality: N/A;    Family History  Problem Relation Age of Onset   Hypertension Mother    Clotting disorder Mother    Hypertension Father    Hyperlipidemia Father    Irritable bowel syndrome Father    Diabetes Maternal Grandmother    Esophageal cancer Maternal Grandfather    Cancer Paternal Grandmother        started in lung   Hypertension Paternal Grandmother    Prostate cancer Paternal Uncle     Social History   Socioeconomic History   Marital status: Married    Spouse name: Not on file   Number of children: 2   Years of education: Not on file  Highest education level: Not on file  Occupational History   Not on file  Tobacco Use   Smoking status: Every Day    Packs/day: 0.30    Years: 13.00    Total pack years: 3.90    Types: Cigarettes   Smokeless tobacco: Never   Tobacco comments:    stopped with preg  Vaping Use   Vaping Use: Never used  Substance and Sexual Activity   Alcohol use: Yes    Alcohol/week: 14.0 standard drinks of alcohol    Types: 14 Standard drinks or equivalent per week    Comment: 1-2 per day   Drug use: No    Types: Marijuana    Comment: stopped with preg   Sexual activity: Yes    Birth control/protection:  Surgical, Condom  Other Topics Concern   Not on file  Social History Narrative   Not on file   Social Determinants of Health   Financial Resource Strain: Not on file  Food Insecurity: Not on file  Transportation Needs: Not on file  Physical Activity: Not on file  Stress: Not on file  Social Connections: Not on file  Intimate Partner Violence: Not on file    Review of Systems  Constitutional:  Negative for chills, fever and weight loss.  Gastrointestinal:  Positive for abdominal pain and diarrhea.  All other systems reviewed and are negative.       Objective    BP 138/89   Pulse 60   Temp 98.1 F (36.7 C) (Oral)   Resp 16   Wt 142 lb (64.4 kg)   LMP 03/09/2014   SpO2 98%   BMI 25.15 kg/m   Physical Exam Vitals and nursing note reviewed.  Constitutional:      General: She is not in acute distress. Cardiovascular:     Rate and Rhythm: Normal rate and regular rhythm.  Pulmonary:     Effort: Pulmonary effort is normal.     Breath sounds: Normal breath sounds.  Abdominal:     Palpations: Abdomen is soft.     Tenderness: There is no abdominal tenderness.  Musculoskeletal:     Right lower leg: No edema.     Left lower leg: No edema.  Neurological:     General: No focal deficit present.     Mental Status: She is alert and oriented to person, place, and time.  Psychiatric:        Mood and Affect: Mood normal.        Behavior: Behavior normal.         Assessment & Plan:   1. Anxiety state Will add Cymbalta 20 mg daily to regimen and monitor  2. Chronic pain syndrome As above. Also   3. Right lower quadrant abdominal pain Referral to GI for continued eval/mgt - Ambulatory referral to Gastroenterology    No follow-ups on file.   Tommie Raymond, MD

## 2022-02-24 DIAGNOSIS — Z419 Encounter for procedure for purposes other than remedying health state, unspecified: Secondary | ICD-10-CM | POA: Diagnosis not present

## 2022-03-03 ENCOUNTER — Encounter: Payer: Self-pay | Admitting: Family Medicine

## 2022-03-09 ENCOUNTER — Encounter: Payer: Self-pay | Admitting: Family Medicine

## 2022-03-09 DIAGNOSIS — M329 Systemic lupus erythematosus, unspecified: Secondary | ICD-10-CM

## 2022-03-12 ENCOUNTER — Ambulatory Visit (INDEPENDENT_AMBULATORY_CARE_PROVIDER_SITE_OTHER): Payer: Medicaid Other | Admitting: Family Medicine

## 2022-03-12 ENCOUNTER — Encounter: Payer: Self-pay | Admitting: Family Medicine

## 2022-03-12 ENCOUNTER — Telehealth: Payer: Self-pay | Admitting: Family Medicine

## 2022-03-12 VITALS — BP 160/116 | HR 69 | Temp 98.3°F | Resp 16 | Ht 62.99 in | Wt 132.6 lb

## 2022-03-12 DIAGNOSIS — I10 Essential (primary) hypertension: Secondary | ICD-10-CM

## 2022-03-12 DIAGNOSIS — R17 Unspecified jaundice: Secondary | ICD-10-CM | POA: Diagnosis not present

## 2022-03-12 DIAGNOSIS — R634 Abnormal weight loss: Secondary | ICD-10-CM

## 2022-03-12 DIAGNOSIS — M329 Systemic lupus erythematosus, unspecified: Secondary | ICD-10-CM

## 2022-03-12 NOTE — Progress Notes (Signed)
Pt presents for weight loss -orthodontist concerned about yellowish eye color and that black coloration of gum line wanted patient to check w/PCP to make sure patient does not have any infections and suggest blood work  -pt has lupus

## 2022-03-12 NOTE — Telephone Encounter (Signed)
Copied from CRM [id number]. Topic: General - Other >> Mar 12, 2022 10:49 AM Brenda Blankenship wrote: Reason for CRM: The patient has been directed by their orthodontist to contact their PCP and request lab work   The patient's orthodontist is concerned with the patient's rate of healing and has directed them to request orders for lab work as well as schedule an office visit (which has been set for 03/20/22 at 8:40)   Please contact the patient further if needed   Brenda Blankenship, unsure what lab work is needed. Could she see Dr. Alvis Lemmings if pt is agreeable?

## 2022-03-12 NOTE — Telephone Encounter (Signed)
Appt scheduled for pt.

## 2022-03-12 NOTE — Progress Notes (Signed)
Established Patient Office Visit  Subjective    Patient ID: Brenda Blankenship, female    DOB: [birth year]  Age: 36 y.o. MRN: [id number]  CC:  Chief Complaint  Patient presents with   Weight Loss    HPI Brenda Blankenship presents with complaint of weight loss as well as yellow eyes. She does have Lupus.She reports a feeling of general malaise.    Outpatient Encounter Medications as of 03/12/2022  Medication Sig   diclofenac Sodium (VOLTAREN) 1 % GEL Apply 2 g topically 4 (four) times daily.   dicyclomine (BENTYL) 10 MG capsule Take 1 capsule (10 mg total) by mouth every 6 (six) hours as needed for spasms (for abdominal pain).   DULoxetine (CYMBALTA) 20 MG capsule Take 1 capsule (20 mg total) by mouth daily.   gabapentin (NEURONTIN) 300 MG capsule Take by mouth.   hydrochlorothiazide (HYDRODIURIL) 50 MG tablet TAKE 1 TABLET(50 MG) BY MOUTH DAILY   hydrOXYzine (VISTARIL) 25 MG capsule Take 1 capsule (25 mg total) by mouth every 8 (eight) hours as needed for anxiety.   ibuprofen (ADVIL) 800 MG tablet Take by mouth.   losartan (COZAAR) 25 MG tablet TAKE 1 TABLET(25 MG) BY MOUTH DAILY   ondansetron (ZOFRAN-ODT) 4 MG disintegrating tablet Take by mouth.   oxyCODONE-acetaminophen (PERCOCET/ROXICET) 5-325 MG tablet Take 1 tablet by mouth every 6 (six) hours as needed for severe pain.   [DISCONTINUED] omeprazole (PRILOSEC) 20 MG capsule Take 1 capsule (20 mg total) by mouth daily. (Patient not taking: Reported on 02/09/2019)   No facility-administered encounter medications on file as of 03/12/2022.    Past Medical History:  Diagnosis Date   Abnormal cervical Papanicolaou smear    cryo   Anemia    Anxiety    DUB (dysfunctional uterine bleeding)    Environmental allergies    HTN (hypertension)    Lupus ([place])    Pregnancy induced hypertension    Preterm labor     Past Surgical History:  Procedure Laterality Date   BILATERAL SALPINGECTOMY Bilateral 04/12/2014   Procedure: BILATERAL  SALPINGECTOMY;  Surgeon: Woodroe Mode, MD;  Location: [facility];  Service: Gynecology;  Laterality: Bilateral;   CRYOTHERAPY     LAPAROSCOPIC TUBAL LIGATION Bilateral 11/10/2013   Procedure: bilateral LAPAROSCOPIC TUBAL LIGATION with filshie clips;  Surgeon: Farrel Gobble. Harrington Challenger, MD;  Location: [facility];  Service: Gynecology;  Laterality: Bilateral;   VAGINAL HYSTERECTOMY N/A 04/12/2014   Procedure: HYSTERECTOMY VAGINAL;  Surgeon: Woodroe Mode, MD;  Location: [facility];  Service: Gynecology;  Laterality: N/A;    Family History  Problem Relation Age of Onset   Hypertension Mother    Clotting disorder Mother    Hypertension Father    Hyperlipidemia Father    Irritable bowel syndrome Father    Diabetes Maternal Grandmother    Esophageal cancer Maternal Grandfather    Cancer Paternal Grandmother        started in lung   Hypertension Paternal Grandmother    Prostate cancer Paternal Uncle     Social History   Socioeconomic History   Marital status: Married    Spouse name: Not on file   Number of children: 2   Years of education: Not on file   Highest education level: Not on file  Occupational History   Not on file  Tobacco Use   Smoking status: Every Day    Packs/day: 0.30    Years: 13.00    Total pack years: 3.90    Types:  Cigarettes   Smokeless tobacco: Never   Tobacco comments:    stopped with preg  Vaping Use   Vaping Use: Never used  Substance and Sexual Activity   Alcohol use: Yes    Alcohol/week: 14.0 standard drinks of alcohol    Types: 14 Standard drinks or equivalent per week    Comment: 1-2 per day   Drug use: No    Types: Marijuana    Comment: stopped with preg   Sexual activity: Yes    Birth control/protection: Surgical, Condom  Other Topics Concern   Not on file  Social History Narrative   Not on file   Social Determinants of Health   Financial Resource Strain: Not on file  Food Insecurity: Not on file  Transportation Needs: Not on file  Physical  Activity: Not on file  Stress: Not on file  Social Connections: Not on file  Intimate Partner Violence: Not on file    Review of Systems  Constitutional:  Positive for malaise/fatigue and weight loss. Negative for chills and fever.  All other systems reviewed and are negative.       Objective    BP (!) 160/116 (BP Location: Left Arm, Patient Position: Sitting, Cuff Size: Normal)   Pulse 69   Temp 98.3 F (36.8 C)   Resp 16   Ht 5' 2.99" (1.6 m)   Wt 132 lb 9.6 oz (60.1 kg)   LMP 03/09/2014   SpO2 98%   BMI 23.50 kg/m   Physical Exam Vitals and nursing note reviewed.  Constitutional:      General: She is not in acute distress. Eyes:     General: Scleral icterus present.  Cardiovascular:     Rate and Rhythm: Normal rate and regular rhythm.  Pulmonary:     Effort: Pulmonary effort is normal.     Breath sounds: Normal breath sounds.  Abdominal:     Palpations: Abdomen is soft.     Tenderness: There is no abdominal tenderness.  Neurological:     General: No focal deficit present.     Mental Status: She is alert and oriented to person, place, and time.         Assessment & Plan:   1. Weight loss, non-intentional Labs ordered. Recommend supplementation with ensure 1-2 times daily.  - CMP14+EGFR - CBC with Differential - TSH  2. Uncontrolled hypertension Discussed compliance.  - CMP14+EGFR  3. Systemic lupus erythematosus, unspecified SLE type, unspecified organ involvement status ([place]) Management as per consultant - CMP14+EGFR - CBC with Differential  4. Yellow eyes Labs ordered - CMP14+EGFR - CBC with Differential  No follow-ups on file.   Becky Sax, MD

## 2022-03-13 LAB — CMP14+EGFR
ALT: 46 IU/L — ABNORMAL HIGH (ref 0–32)
AST: 68 IU/L — ABNORMAL HIGH (ref 0–40)
Albumin/Globulin Ratio: 1.5 (ref 1.2–2.2)
Albumin: 4.1 g/dL (ref 3.9–4.9)
Alkaline Phosphatase: 89 IU/L (ref 44–121)
BUN/Creatinine Ratio: 6 — ABNORMAL LOW (ref 9–23)
BUN: 5 mg/dL — ABNORMAL LOW (ref 6–20)
Bilirubin Total: 1.1 mg/dL (ref 0.0–1.2)
CO2: 22 mmol/L (ref 20–29)
Calcium: 9.1 mg/dL (ref 8.7–10.2)
Chloride: 96 mmol/L (ref 96–106)
Creatinine, Ser: 0.78 mg/dL (ref 0.57–1.00)
Globulin, Total: 2.7 g/dL (ref 1.5–4.5)
Glucose: 66 mg/dL — ABNORMAL LOW (ref 70–99)
Potassium: 4.1 mmol/L (ref 3.5–5.2)
Sodium: 136 mmol/L (ref 134–144)
Total Protein: 6.8 g/dL (ref 6.0–8.5)
eGFR: 101 mL/min/{1.73_m2} (ref >59)

## 2022-03-13 LAB — CBC WITH DIFFERENTIAL/PLATELET
Basophils Absolute: 0 10*3/uL (ref 0.0–0.2)
Basos: 1 %
EOS (ABSOLUTE): 0 10*3/uL (ref 0.0–0.4)
Eos: 1 %
Hematocrit: 40.6 % (ref 34.0–46.6)
Hemoglobin: 13.6 g/dL (ref 11.1–15.9)
Immature Grans (Abs): 0 10*3/uL (ref 0.0–0.1)
Immature Granulocytes: 1 %
Lymphocytes Absolute: 1.6 10*3/uL (ref 0.7–3.1)
Lymphs: 38 %
MCH: 31.1 pg (ref 26.6–33.0)
MCHC: 33.5 g/dL (ref 31.5–35.7)
MCV: 93 fL (ref 79–97)
Monocytes Absolute: 0.4 10*3/uL (ref 0.1–0.9)
Monocytes: 10 %
NRBC: 1 % — ABNORMAL HIGH (ref 0–0)
Neutrophils Absolute: 2.1 10*3/uL (ref 1.4–7.0)
Neutrophils: 49 %
Platelets: 227 10*3/uL (ref 150–450)
RBC: 4.37 x10E6/uL (ref 3.77–5.28)
RDW: 15.7 % — ABNORMAL HIGH (ref 11.7–15.4)
WBC: 4.1 10*3/uL (ref 3.4–10.8)

## 2022-03-13 LAB — TSH: TSH: 1.61 u[IU]/mL (ref 0.450–4.500)

## 2022-03-16 ENCOUNTER — Encounter: Payer: Self-pay | Admitting: Family Medicine

## 2022-03-16 ENCOUNTER — Other Ambulatory Visit: Payer: Self-pay | Admitting: *Deleted

## 2022-03-16 ENCOUNTER — Encounter: Payer: Self-pay | Admitting: *Deleted

## 2022-03-16 DIAGNOSIS — R7989 Other specified abnormal findings of blood chemistry: Secondary | ICD-10-CM

## 2022-03-20 ENCOUNTER — Ambulatory Visit: Payer: Medicaid Other | Admitting: Family

## 2022-03-26 ENCOUNTER — Other Ambulatory Visit: Payer: Medicaid Other

## 2022-03-26 DIAGNOSIS — R7989 Other specified abnormal findings of blood chemistry: Secondary | ICD-10-CM

## 2022-03-26 NOTE — Progress Notes (Signed)
Patient came in for [facility] lab. No complications Kieth Brightly

## 2022-03-27 DIAGNOSIS — Z419 Encounter for procedure for purposes other than remedying health state, unspecified: Secondary | ICD-10-CM | POA: Diagnosis not present

## 2022-03-27 LAB — CMP14+EGFR
ALT: 53 IU/L — ABNORMAL HIGH (ref 0–32)
AST: 70 IU/L — ABNORMAL HIGH (ref 0–40)
Albumin/Globulin Ratio: 1.6 (ref 1.2–2.2)
Albumin: 3.9 g/dL (ref 3.9–4.9)
Alkaline Phosphatase: 73 IU/L (ref 44–121)
BUN/Creatinine Ratio: 6 — ABNORMAL LOW (ref 9–23)
BUN: 5 mg/dL — ABNORMAL LOW (ref 6–20)
Bilirubin Total: 0.5 mg/dL (ref 0.0–1.2)
CO2: 22 mmol/L (ref 20–29)
Calcium: 8.7 mg/dL (ref 8.7–10.2)
Chloride: 96 mmol/L (ref 96–106)
Creatinine, Ser: 0.78 mg/dL (ref 0.57–1.00)
Globulin, Total: 2.4 g/dL (ref 1.5–4.5)
Glucose: 68 mg/dL — ABNORMAL LOW (ref 70–99)
Potassium: 4.3 mmol/L (ref 3.5–5.2)
Sodium: 136 mmol/L (ref 134–144)
Total Protein: 6.3 g/dL (ref 6.0–8.5)
eGFR: 101 mL/min/{1.73_m2} (ref >59)

## 2022-04-02 ENCOUNTER — Encounter: Payer: Self-pay | Admitting: Obstetrics and Gynecology

## 2022-04-02 ENCOUNTER — Other Ambulatory Visit: Payer: Self-pay

## 2022-04-02 ENCOUNTER — Ambulatory Visit (INDEPENDENT_AMBULATORY_CARE_PROVIDER_SITE_OTHER): Payer: Medicaid Other | Admitting: Obstetrics and Gynecology

## 2022-04-02 DIAGNOSIS — R102 Pelvic and perineal pain: Secondary | ICD-10-CM | POA: Diagnosis not present

## 2022-04-02 DIAGNOSIS — Z90710 Acquired absence of both cervix and uterus: Secondary | ICD-10-CM | POA: Insufficient documentation

## 2022-04-02 MED ORDER — PHENAZOPYRIDINE HCL 200 MG PO TABS: 200.0000 mg | ORAL_TABLET | Freq: Two times a day (BID) | ORAL | 0 refills | Status: AC

## 2022-04-02 MED ORDER — NITROFURANTOIN MONOHYD MACRO 100 MG PO CAPS: ORAL_CAPSULE | ORAL | 0 refills | Status: DC

## 2022-04-02 NOTE — Progress Notes (Signed)
Pt reports cramping in lower abdomen, PCP thinks that she still may have her ovaries or scar tissue that causing the cramps.

## 2022-04-02 NOTE — Progress Notes (Signed)
Brenda Blankenship presents with c/o pelvic pain for the last 1-1 1/2 yrs. Constant at times, 1-2 weeks as well. Problems with bowels as well Has seen GI, normal w/u thus far Pain with IC as week Denies any bladder dysfunction H/O TVH with BS 03/2014  PE AF VSS Chaperone present  Lungs clear Heart RRR Abd soft + BS GU Nl EGBUS, + bladder tenderness, ( reproduces pt's pain), uterus and cervix sugerically absent, no adnexal masses or tenderness  A/P Pelvic Pain  Suspect pt's pain is related to her bladder. Will treat with Macrobid and Pyridium. Will check GYN U/S as well. F/U in 4 weeks

## 2022-04-02 NOTE — Patient Instructions (Signed)
Pelvic Pain, Female Pelvic pain is pain in your lower belly (abdomen), below your belly button and between your hips. The pain may: Start all of a sudden (be acute). Keep coming back (be recurring). Last a long time (become chronic). Pelvic pain that lasts longer than 6 months is called chronic pelvic pain. There are many causes of pelvic pain. Sometimes the cause of pelvic pain is not known. Follow these instructions at home:  Take over-the-counter and prescription medicines only as told by your doctor. Rest as told by your doctor. Do not have sex if it hurts. Keep a journal of your pelvic pain. Write down: When the pain started. Where the pain is located. What seems to make the pain better or worse, such as food or your monthly period (menstrual cycle). Any symptoms you have along with the pain. Keep all follow-up visits. Contact a doctor if: Medicine does not help your pain, or your pain comes back. You have new symptoms. You have unusual discharge or bleeding from your vagina. You have a fever or chills. You are having trouble pooping (constipation). You have blood in your pee (urine) or poop (stool). Your pee smells bad. You feel weak or light-headed. Get help right away if: You have sudden pain that is very bad. You have very bad pain and also have any of these symptoms: A fever. Feeling like you may vomit (nauseous). Vomiting. Being very sweaty. You faint. These symptoms may be an emergency. Get help right away. Call your local emergency services (911 in the [place] Do not wait to see if the symptoms will go away. Do not drive yourself to the hospital. Summary Pelvic pain is pain in your lower belly (abdomen), below your belly button and between your hips. There are many causes of pelvic pain. Keep a journal of your pelvic pain. This information is not intended to replace advice given to you by your health care provider. Make sure you discuss any questions you have with  your health care provider. Document Revised: 11/19/2020 Document Reviewed: 11/19/2020 Elsevier Patient Education  2023 Elsevier Inc.   

## 2022-04-13 ENCOUNTER — Ambulatory Visit: Admission: RE | Admit: 2022-04-13 | Payer: Medicaid Other | Source: Ambulatory Visit

## 2022-04-26 DIAGNOSIS — Z419 Encounter for procedure for purposes other than remedying health state, unspecified: Secondary | ICD-10-CM | POA: Diagnosis not present

## 2022-04-30 ENCOUNTER — Ambulatory Visit: Payer: Medicaid Other | Admitting: Obstetrics and Gynecology

## 2022-05-12 ENCOUNTER — Ambulatory Visit: Payer: Medicaid Other | Attending: Internal Medicine | Admitting: Internal Medicine

## 2022-05-12 NOTE — Progress Notes (Deleted)
Office Visit Note  Patient: Brenda Blankenship             Date of Birth: [birth year]           MRN: [id number]             PCP: Georganna Skeans, MD Referring: Georganna Skeans, MD Visit Date: 05/12/2022 Occupation: @GUAROCC @  Subjective:  No chief complaint on file.   History of Present Illness: Brenda Blankenship is a 36 y.o. female here for evaluation management of systemic lupus.  She previously saw Dr. 31 last year for evaluation with findings of joint pain, pedal edema, photosensitive rashes, Raynaud's, and dry mouth.  Plan was trial of hydroxychloroquine for symptoms at that time.  Laboratory studies***   Labs reviewed 02/2022 CBC wnl CMP AST 70 ALT 53 CMP AST 68 ALT 46  12/2021 CBC WBC 2.7 Hgb 15.5 CMP TBili 1.9  02/2021 dsDNA Ab pos Sm, SSA, SSB, Scl-70, centromere neg Complement C3 96 C4 16  Activities of Daily Living:  Patient reports morning stiffness for *** {minute/hour:19697}.   Patient {ACTIONS;DENIES/REPORTS:21021675::"Denies"} nocturnal pain.  Difficulty dressing/grooming: {ACTIONS;DENIES/REPORTS:21021675::"Denies"} Difficulty climbing stairs: {ACTIONS;DENIES/REPORTS:21021675::"Denies"} Difficulty getting out of chair: {ACTIONS;DENIES/REPORTS:21021675::"Denies"} Difficulty using hands for taps, buttons, cutlery, and/or writing: {ACTIONS;DENIES/REPORTS:21021675::"Denies"}  No Rheumatology ROS completed.   PMFS History:  Patient Active Problem List   Diagnosis Date Noted   History of vaginal hysterectomy 04/02/2022   Pelvic pain 04/02/2022    Past Medical History:  Diagnosis Date   Abnormal cervical Papanicolaou smear    cryo   Anemia    Anxiety    DUB (dysfunctional uterine bleeding)    Environmental allergies    HTN (hypertension)    Lupus ([facility])    Pregnancy induced hypertension    Preterm labor     Family History  Problem Relation Age of Onset   Hypertension Mother    Clotting disorder Mother    Hypertension Father    Hyperlipidemia  Father    Irritable bowel syndrome Father    Diabetes Maternal Grandmother    Esophageal cancer Maternal Grandfather    Cancer Paternal Grandmother        started in lung   Hypertension Paternal Grandmother    Prostate cancer Paternal Uncle    Past Surgical History:  Procedure Laterality Date   BILATERAL SALPINGECTOMY Bilateral 04/12/2014   Procedure: BILATERAL SALPINGECTOMY;  Surgeon: 04/14/2014, MD;  Location: [facility];  Service: Gynecology;  Laterality: Bilateral;   CRYOTHERAPY     LAPAROSCOPIC TUBAL LIGATION Bilateral 11/10/2013   Procedure: bilateral LAPAROSCOPIC TUBAL LIGATION with filshie clips;  Surgeon: 11/12/2013. Freddrick March, MD;  Location: [facility];  Service: Gynecology;  Laterality: Bilateral;   VAGINAL HYSTERECTOMY N/A 04/12/2014   Procedure: HYSTERECTOMY VAGINAL;  Surgeon: 04/14/2014, MD;  Location: [facility];  Service: Gynecology;  Laterality: N/A;   Social History   Social History Narrative   Not on file   There is no immunization history for the selected administration types on file for this patient.   Objective: Vital Signs: LMP 03/09/2014    Physical Exam   Musculoskeletal Exam: ***  CDAI Exam: CDAI Score: -- Patient Global: --; Provider Global: -- Swollen: --; Tender: -- Joint Exam 05/12/2022   No joint exam has been documented for this visit   There is currently no information documented on the homunculus. Go to the Rheumatology activity and complete the homunculus joint exam.  Investigation: No additional findings.  Imaging: No results found.  Recent Labs:  Lab Results  Component Value Date   WBC 4.1 03/12/2022   HGB 13.6 03/12/2022   PLT 227 03/12/2022   NA 136 03/26/2022   K 4.3 03/26/2022   CL 96 03/26/2022   CO2 22 03/26/2022   GLUCOSE 68 (L) 03/26/2022   BUN 5 (L) 03/26/2022   CREATININE 0.78 03/26/2022   BILITOT 0.5 03/26/2022   ALKPHOS 73 03/26/2022   AST 70 (H) 03/26/2022   ALT 53 (H) 03/26/2022   PROT 6.3 03/26/2022   ALBUMIN  3.9 03/26/2022   CALCIUM 8.7 03/26/2022   GFRAA >60 05/06/2019    Speciality Comments: No specialty comments available.  Procedures:  No procedures performed Allergies: Hydrocodone, Tape, and Naprosyn [naproxen]   Assessment / Plan:     Visit Diagnoses: No diagnosis found.  Orders: No orders of the defined types were placed in this encounter.  No orders of the defined types were placed in this encounter.   Face-to-face time spent with patient was *** minutes. Greater than 50% of time was spent in counseling and coordination of care.  Follow-Up Instructions: No follow-ups on file.   Collier Salina, MD  Note - This record has been created using [employer].  Chart creation errors have been sought, but may not always  have been located. Such creation errors do not reflect on  the standard of medical care.

## 2022-05-27 DIAGNOSIS — Z419 Encounter for procedure for purposes other than remedying health state, unspecified: Secondary | ICD-10-CM | POA: Diagnosis not present

## 2022-06-26 DIAGNOSIS — Z419 Encounter for procedure for purposes other than remedying health state, unspecified: Secondary | ICD-10-CM | POA: Diagnosis not present

## 2022-07-27 DIAGNOSIS — Z419 Encounter for procedure for purposes other than remedying health state, unspecified: Secondary | ICD-10-CM | POA: Diagnosis not present

## 2022-08-07 ENCOUNTER — Ambulatory Visit: Payer: Medicaid Other | Admitting: Podiatry

## 2022-08-11 ENCOUNTER — Ambulatory Visit: Payer: Medicaid Other | Admitting: Family Medicine

## 2022-08-11 ENCOUNTER — Ambulatory Visit: Payer: Medicaid Other | Admitting: Family

## 2022-08-14 ENCOUNTER — Ambulatory Visit: Payer: Self-pay

## 2022-08-14 ENCOUNTER — Telehealth: Payer: Self-pay | Admitting: Family Medicine

## 2022-08-14 ENCOUNTER — Ambulatory Visit
Admission: EM | Admit: 2022-08-14 | Discharge: 2022-08-14 | Disposition: A | Discharge status: Home / Self Care | Payer: Medicaid Other | Attending: Urgent Care | Admitting: Urgent Care

## 2022-08-14 DIAGNOSIS — M79672 Pain in left foot: Secondary | ICD-10-CM

## 2022-08-14 DIAGNOSIS — M329 Systemic lupus erythematosus, unspecified: Secondary | ICD-10-CM

## 2022-08-14 DIAGNOSIS — I10 Essential (primary) hypertension: Secondary | ICD-10-CM | POA: Diagnosis not present

## 2022-08-14 MED ORDER — PREDNISONE 20 MG PO TABS
ORAL_TABLET | ORAL | 0 refills | Status: DC
Start: 2022-08-14 — End: 2023-01-25

## 2022-08-14 MED ORDER — OXYCODONE-ACETAMINOPHEN 5-325 MG PO TABS: 1.0000 | ORAL_TABLET | Freq: Four times a day (QID) | ORAL | 0 refills | Status: DC | PRN

## 2022-08-14 NOTE — ED Triage Notes (Signed)
Pt c/o pain/swelling to left foot x 2 weeks-states she has has issues with bilat feet/ankle x 1-2 years-states she has been seen by ortho and podiatry in the past for c/o-states she was not able to work the double she was scheduled today-NAD-limping gait

## 2022-08-14 NOTE — ED Provider Notes (Signed)
[facility]  Note:  This document was prepared using Systems analyst and may include unintentional dictation errors.  MRN: [id number] DOB: 1985/12/04  Subjective:   Brenda Blankenship is a 37 y.o. female presenting for 2 week history of persistent left foot pain with swelling worse at the end of her day.  Patient has a history of lupus and has been working with a podiatrist on chronic foot pain.  They are also helping her with skin and toenail care.  Unfortunately they could not get her in and plan on doing so this next week.  Patient has been using Tylenol and ibuprofen and is getting no relief from this.  Regarding her blood pressure, she is compliant with her medications but reports that her blood pressure is always elevated this time of day.  When she takes her blood pressure medications at night it does get lower and is lowest in the morning when she wakes up.  Denies any history of stroke, active headache or confusion, weakness, numbness or tingling.  Patient does not actively practice hypertensive friendly diet.  No current facility-administered medications for this encounter.  Current Outpatient Medications:    dicyclomine (BENTYL) 10 MG capsule, Take 1 capsule (10 mg total) by mouth every 6 (six) hours as needed for spasms (for abdominal pain)., Disp: 120 capsule, Rfl: 0   DULoxetine (CYMBALTA) 20 MG capsule, Take 1 capsule (20 mg total) by mouth daily., Disp: 30 capsule, Rfl: 3   gabapentin (NEURONTIN) 300 MG capsule, Take by mouth., Disp: , Rfl:    hydrochlorothiazide (HYDRODIURIL) 50 MG tablet, TAKE 1 TABLET(50 MG) BY MOUTH DAILY, Disp: 90 tablet, Rfl: 0   losartan (COZAAR) 25 MG tablet, TAKE 1 TABLET(25 MG) BY MOUTH DAILY, Disp: 90 tablet, Rfl: 0   nitrofurantoin, macrocrystal-monohydrate, (MACROBID) 100 MG capsule, 1 tablet bid x 7 days and then 1 tablet qhs until gone, Disp: 35 capsule, Rfl: 0   ondansetron (ZOFRAN-ODT) 4 MG disintegrating  tablet, Take by mouth., Disp: , Rfl:    phenazopyridine (PYRIDIUM) 200 MG tablet, Take 1 tablet (200 mg total) by mouth 2 (two) times daily with a meal., Disp: 14 tablet, Rfl: 0   Allergies  Allergen Reactions   Hydrocodone Other (See Comments)    Pt states that this medication causes her to pass out. Pt can take oxycodone    Tape Rash and Itching    IV tape   Naprosyn [Naproxen] Itching and Rash    Pt can take ibuprofen and other NSAIDs    Past Medical History:  Diagnosis Date   Abnormal cervical Papanicolaou smear    cryo   Anemia    Anxiety    DUB (dysfunctional uterine bleeding)    Environmental allergies    HTN (hypertension)    Lupus ([place])    Pregnancy induced hypertension    Preterm labor      Past Surgical History:  Procedure Laterality Date   BILATERAL SALPINGECTOMY Bilateral 04/12/2014   Procedure: BILATERAL SALPINGECTOMY;  Surgeon: Woodroe Mode, MD;  Location: [facility];  Service: Gynecology;  Laterality: Bilateral;   CRYOTHERAPY     LAPAROSCOPIC TUBAL LIGATION Bilateral 11/10/2013   Procedure: bilateral LAPAROSCOPIC TUBAL LIGATION with filshie clips;  Surgeon: Farrel Gobble. Harrington Challenger, MD;  Location: [facility];  Service: Gynecology;  Laterality: Bilateral;   VAGINAL HYSTERECTOMY N/A 04/12/2014   Procedure: HYSTERECTOMY VAGINAL;  Surgeon: Woodroe Mode, MD;  Location: [facility];  Service: Gynecology;  Laterality: N/A;  Family History  Problem Relation Age of Onset   Hypertension Mother    Clotting disorder Mother    Hypertension Father    Hyperlipidemia Father    Irritable bowel syndrome Father    Diabetes Maternal Grandmother    Esophageal cancer Maternal Grandfather    Cancer Paternal Grandmother        started in lung   Hypertension Paternal Grandmother    Prostate cancer Paternal Uncle     Social History   Tobacco Use   Smoking status: Every Day    Packs/day: 0.30    Years: 13.00    Total pack years: 3.90    Types: Cigarettes   Smokeless tobacco: Never    Tobacco comments:    stopped with preg  Vaping Use   Vaping Use: Never used  Substance Use Topics   Alcohol use: Yes    Alcohol/week: 14.0 standard drinks of alcohol    Types: 14 Standard drinks or equivalent per week    Comment: weekly   Drug use: No    Types: Marijuana    ROS   Objective:   Vitals: BP (!) 190/130 (BP Location: Right Arm) Comment: pt states BP is her basline  Pulse 82   Temp 98.2 F (36.8 C) (Oral)   Resp 16   LMP 03/09/2014   SpO2 98%   BP Readings from Last 3 Encounters:  08/14/22 (!) 190/130  04/02/22 (!) 154/100  03/12/22 (!) 160/116   Physical Exam Constitutional:      General: She is not in acute distress.    Appearance: Normal appearance. She is well-developed. She is not ill-appearing, toxic-appearing or diaphoretic.  HENT:     Head: Normocephalic and atraumatic.     Nose: Nose normal.     Mouth/Throat:     Mouth: Mucous membranes are moist.  Eyes:     General: No scleral icterus.       Right eye: No discharge.        Left eye: No discharge.     Extraocular Movements: Extraocular movements intact.  Cardiovascular:     Rate and Rhythm: Normal rate.  Pulmonary:     Effort: Pulmonary effort is normal.  Musculoskeletal:     Left foot: Decreased range of motion. Normal capillary refill. Swelling, tenderness and bony tenderness present. No deformity, bunion, laceration or crepitus.       Feet:  Skin:    General: Skin is warm and dry.  Neurological:     General: No focal deficit present.     Mental Status: She is alert and oriented to person, place, and time.  Psychiatric:        Mood and Affect: Mood normal.        Behavior: Behavior normal.    I applied a dressing to the left fourth and fifth toes.  Assessment and Plan :   I have reviewed the PDMP during this encounter.  1. Left foot pain   2. Lupus ([place])   3. Essential hypertension     I have low suspicion for fracture.  Suspect that her inflammatory pain is due to  overuse from her work and also her lupus.  She is not taking her blood pressure medicine today and I expect it will be better thereafter.  I also suspect that her blood pressure is elevated due to her severe pain.  Recommended avoiding ibuprofen.  Advised that she use Tylenol and oxycodone today.  Start prednisone tomorrow and emphasized making sure that her  blood pressure remains at her morning levels.  Also emphasized need for hypertensive friendly diet.  Follow-up with podiatrist soon as possible. Counseled patient on potential for adverse effects with medications prescribed/recommended today, ER and return-to-clinic precautions discussed, patient verbalized understanding.    Wallis Bamberg, [place] 08/14/22 [id number]

## 2022-08-14 NOTE — Discharge Instructions (Addendum)
Please schedule Tylenol at 500 mg - 650 mg once every 6 hours as needed for aches and pains.  If you still have pain despite taking Tylenol regularly, this is breakthrough pain.  You can use oxycodone once every 6 hours for this.  Once your pain is better controlled, switch back to just Tylenol.  Follow-up with your podiatrist as soon as possible.  In the morning, you can start prednisone to help with the inflammatory pain you are experiencing.  Please make sure that you are taking your blood pressure medicines regularly.   For diabetes or elevated blood sugar, please make sure you are limiting and avoiding starchy, carbohydrate foods like pasta, breads, sweet breads, pastry, rice, potatoes, desserts. These foods can elevate your blood sugar. Also, limit and avoid drinks that contain a lot of sugar such as sodas, sweet teas, fruit juices.  Drinking plain water will be much more helpful, try 64 ounces of water daily.  It is okay to flavor your water naturally by cutting cucumber, lemon, mint or lime, placing it in a picture with water and drinking it over a period of 24-48 hours as long as it remains refrigerated.  For elevated blood pressure, make sure you are monitoring salt in your diet.  Do not eat restaurant foods and limit processed foods at home. I highly recommend you prepare and cook your own foods at home.  Processed foods include things like frozen meals, pre-seasoned meats and dinners, deli meats, canned foods as these foods contain a high amount of sodium/salt.  Make sure you are paying attention to sodium labels on foods you buy at the grocery store. Buy your spices separately such as garlic powder, onion powder, cumin, cayenne, parsley flakes so that you can avoid seasonings that contain salt. However, salt-free seasonings are available and can be used, an example is Mrs. Dash and includes a lot of different mixtures that do not contain salt.  Lastly, when cooking using oils that are healthier  for you is important. This includes olive oil, avocado oil, canola oil. We have discussed a lot of foods to avoid but below is a list of foods that can be very healthy to use in your diet whether it is for diabetes, cholesterol, high blood pressure, or in general healthy eating.  Salads - kale, spinach, cabbage, spring mix, arugula Fruits - avocadoes, berries (blueberries, raspberries, blackberries), apples, oranges, pomegranate, grapefruit, kiwi Vegetables - asparagus, cauliflower, broccoli, green beans, brussel sprouts, bell peppers, beets; stay away from or limit starchy vegetables like potatoes, carrots, peas Other general foods - kidney beans, egg whites, almonds, walnuts, sunflower seeds, pumpkin seeds, fat free yogurt, almond milk, flax seeds, quinoa, oats  Meat - It is better to eat lean meats and limit your red meat including pork to once a week.  Wild caught fish, chicken breast are good options as they tend to be leaner sources of good protein. Still be mindful of the sodium labels for the meats you buy.  DO NOT EAT ANY FOODS ON THIS LIST THAT YOU ARE ALLERGIC TO. For more specific needs, I highly recommend consulting a dietician or nutritionist but this can definitely be a good starting point.

## 2022-08-14 NOTE — Telephone Encounter (Signed)
Attempted to reach pt 3xs to offer sooner appt available at [facility] , no answer- just rings.

## 2022-08-14 NOTE — Telephone Encounter (Signed)
  Chief Complaint: Ankle swelling and pain Symptoms: Pain and swelling Frequency: ongoing Pertinent Negatives: Patient denies fever Disposition: [] ED /[x] Urgent Care (no appt availability in office) / [] Appointment(In office/virtual)/ []  [facility]/ [] Home Care/ [] Refused Recommended Disposition /[] [facility]/ []  Follow-up with PCP Additional Notes: PT called stating that her ankle is swelling and that she was scheduled to work a double shift today and she is unable to do that d/t pain and swelling. Pt was told by work that she would need to call her PCP for direction. PT states that her ankle is always swollen.  PT will go to UC for evaluation. Follow up appt also made.  Summary: Swelling on left ankle   Pt stated she is experiencing swelling on her left ankle. Pt stated she needs to know now what Dr. Redmond Pulling would like for her to do, as she cannot work a double like this. Pt also mentioned having had ankle swelling issues in the past.  Pt seeking clinical advice.       Reason for Disposition  MILD or MODERATE ankle swelling (e.g., can't move joint normally, can't do usual activities) (Exceptions: Itchy, localized swelling; swelling is chronic.)  Answer Assessment - Initial Assessment Questions 1. LOCATION: "Which ankle is swollen?" "Where is the swelling?"     Left ankle 2. ONSET: "When did the swelling start?"     It's been swelling 3. SWELLING: "How bad is the swelling?" Or, "How large is it?" (e.g., mild, moderate, severe; size of localized swelling)    - NONE: No joint swelling.   - LOCALIZED: Localized; small area of puffy or swollen skin (e.g., insect bite, skin irritation).   - MILD: Joint looks or feels mildly swollen or puffy.   - MODERATE: Swollen; interferes with normal activities (e.g., work or school); decreased range of movement; may be limping.   - SEVERE: Very swollen; can't move swollen joint at all; limping a lot or unable to walk.     Mild -  limping 4. PAIN: "Is there any pain?" If Yes, ask: "How bad is it?" (Scale 1-10; or mild, moderate, severe)   - NONE (0): no pain.   - MILD (1-3): doesn't interfere with normal activities.    - MODERATE (4-7): interferes with normal activities (e.g., work or school) or awakens from sleep, limping.    - SEVERE (8-10): excruciating pain, unable to do any normal activities, unable to walk.      8/10 - taking IBU 5. CAUSE: "What do you think caused the ankle swelling?"     Lupus 6. OTHER SYMPTOMS: "Do you have any other symptoms?" (e.g., fever, chest pain, difficulty breathing, calf pain)     no 7. PREGNANCY: "Is there any chance you are pregnant?" "When was your last menstrual period?"     no  Protocols used: Ankle Swelling-A-AH

## 2022-08-21 NOTE — Progress Notes (Signed)
  Patient ID: Brenda Blankenship, female    DOB: 01/31/1986  MRN: [id number]  CC: Urgent Care Follow-Up  Subjective: Brenda Blankenship is a 36 y.o. female who presents for urgent care follow-up.   Her concerns today include:  08/14/2022  Urgent Care at [facility]) per PA note: I have reviewed the PDMP during this encounter.   1. Left foot pain   2. Lupus (HCC)   3. Essential hypertension       I have low suspicion for fracture.  Suspect that her inflammatory pain is due to overuse from her work and also her lupus.  She is not taking her blood pressure medicine today and I expect it will be better thereafter.  I also suspect that her blood pressure is elevated due to her severe pain.  Recommended avoiding ibuprofen.  Advised that she use Tylenol and oxycodone today.  Start prednisone tomorrow and emphasized making sure that her blood pressure remains at her morning levels.  Also emphasized need for hypertensive friendly diet.  Follow-up with podiatrist soon as possible. Counseled patient on potential for adverse effects with medications prescribed/recommended today, ER and return-to-clinic precautions discussed, patient verbalized understanding.   Today's visit 08/25/2022: Consultant for lupus per [facility] note 03/12/2022 HTN - HCTZ, Losartan  Podiatry referral    Patient Active Problem List   Diagnosis Date Noted   History of vaginal hysterectomy 04/02/2022   Pelvic pain 04/02/2022     Current Outpatient Medications on File Prior to Visit  Medication Sig Dispense Refill   dicyclomine (BENTYL) 10 MG capsule Take 1 capsule (10 mg total) by mouth every 6 (six) hours as needed for spasms (for abdominal pain). 120 capsule 0   DULoxetine (CYMBALTA) 20 MG capsule Take 1 capsule (20 mg total) by mouth daily. 30 capsule 3   gabapentin (NEURONTIN) 300 MG capsule Take by mouth.     hydrochlorothiazide (HYDRODIURIL) 50 MG tablet TAKE 1 TABLET(50 MG) BY MOUTH DAILY 90 tablet 0    losartan (COZAAR) 25 MG tablet TAKE 1 TABLET(25 MG) BY MOUTH DAILY 90 tablet 0   nitrofurantoin, macrocrystal-monohydrate, (MACROBID) 100 MG capsule 1 tablet bid x 7 days and then 1 tablet qhs until gone 35 capsule 0   ondansetron (ZOFRAN-ODT) 4 MG disintegrating tablet Take by mouth.     oxyCODONE-acetaminophen (PERCOCET/ROXICET) 5-325 MG tablet Take 1 tablet by mouth every 6 (six) hours as needed for severe pain. 10 tablet 0   phenazopyridine (PYRIDIUM) 200 MG tablet Take 1 tablet (200 mg total) by mouth 2 (two) times daily with a meal. 14 tablet 0   predniSONE (DELTASONE) 20 MG tablet Take 2 tablets daily with breakfast. 10 tablet 0   [DISCONTINUED] omeprazole (PRILOSEC) 20 MG capsule Take 1 capsule (20 mg total) by mouth daily. (Patient not taking: Reported on 02/09/2019) 30 capsule 0   No current facility-administered medications on file prior to visit.    Allergies  Allergen Reactions   Hydrocodone Other (See Comments)    Pt states that this medication causes her to pass out. Pt can take oxycodone    Tape Rash and Itching    IV tape   Naprosyn [Naproxen] Itching and Rash    Pt can take ibuprofen and other NSAIDs    Social History   Socioeconomic History   Marital status: Married    Spouse name: Not on file   Number of children: 2   Years of education: Not on file   Highest education level: Not   on file  Occupational History   Not on file  Tobacco Use   Smoking status: Every Day    Packs/day: 0.30    Years: 13.00    Total pack years: 3.90    Types: Cigarettes   Smokeless tobacco: Never   Tobacco comments:    stopped with preg  Vaping Use   Vaping Use: Never used  Substance and Sexual Activity   Alcohol use: Yes    Alcohol/week: 14.0 standard drinks of alcohol    Types: 14 Standard drinks or equivalent per week    Comment: weekly   Drug use: No    Types: Marijuana   Sexual activity: Yes    Birth control/protection: Surgical  Other Topics Concern   Not on file   Social History Narrative   Not on file   Social Determinants of Health   Financial Resource Strain: Not on file  Food Insecurity: Not on file  Transportation Needs: Not on file  Physical Activity: Not on file  Stress: Not on file  Social Connections: Not on file  Intimate Partner Violence: Not on file    Family History  Problem Relation Age of Onset   Hypertension Mother    Clotting disorder Mother    Hypertension Father    Hyperlipidemia Father    Irritable bowel syndrome Father    Diabetes Maternal Grandmother    Esophageal cancer Maternal Grandfather    Cancer Paternal Grandmother        started in lung   Hypertension Paternal Grandmother    Prostate cancer Paternal Uncle     Past Surgical History:  Procedure Laterality Date   BILATERAL SALPINGECTOMY Bilateral 04/12/2014   Procedure: BILATERAL SALPINGECTOMY;  Surgeon: James G Arnold, MD;  Location: [facility];  Service: Gynecology;  Laterality: Bilateral;   CRYOTHERAPY     LAPAROSCOPIC TUBAL LIGATION Bilateral 11/10/2013   Procedure: bilateral LAPAROSCOPIC TUBAL LIGATION with filshie clips;  Surgeon: Kendra H. Ross, MD;  Location: [facility];  Service: Gynecology;  Laterality: Bilateral;   VAGINAL HYSTERECTOMY N/A 04/12/2014   Procedure: HYSTERECTOMY VAGINAL;  Surgeon: James G Arnold, MD;  Location: [facility];  Service: Gynecology;  Laterality: N/A;    ROS: Review of Systems Negative except as stated above  PHYSICAL EXAM: LMP 03/09/2014   Physical Exam  {female adult master:310786} {female adult master:310785}     Latest Ref Rng & Units 03/26/2022    9:56 AM 03/12/2022    2:36 PM 12/29/2021    5:05 PM  CMP  Glucose 70 - 99 mg/dL 68  66  116   BUN 6 - 20 mg/dL 5  5  9   Creatinine 0.57 - 1.00 mg/dL 0.78  0.78  0.86   Sodium 134 - 144 mmol/L 136  136  132   Potassium 3.5 - 5.2 mmol/L 4.3  4.1  3.7   Chloride 96 - 106 mmol/L 96  96  97   CO2 20 - 29 mmol/L 22  22  23   Calcium 8.7 - 10.2 mg/dL 8.7  9.1  9.9   Total  Protein 6.0 - 8.5 g/dL 6.3  6.8  8.0   Total Bilirubin 0.0 - 1.2 mg/dL 0.5  1.1  1.7   Alkaline Phos 44 - 121 IU/L 73  89  60   AST 0 - 40 IU/L 70  68  29   ALT 0 - 32 IU/L 53  46  15    Lipid Panel  No results found for: "CHOL", "TRIG", "  HDL", "CHOLHDL", "VLDL", "LDLCALC", "LDLDIRECT"  CBC    Component Value Date/Time   WBC 4.1 03/12/2022 1436   WBC 3.7 (L) 12/29/2021 1705   RBC 4.37 03/12/2022 1436   RBC 5.18 (H) 12/29/2021 1705   HGB 13.6 03/12/2022 1436   HCT 40.6 03/12/2022 1436   PLT 227 03/12/2022 1436   MCV 93 03/12/2022 1436   MCH 31.1 03/12/2022 1436   MCH 29.2 12/29/2021 1705   MCHC 33.5 03/12/2022 1436   MCHC 33.8 12/29/2021 1705   RDW 15.7 (H) 03/12/2022 1436   LYMPHSABS 1.6 03/12/2022 1436   MONOABS 0.5 05/27/2021 1025   EOSABS 0.0 03/12/2022 1436   BASOSABS 0.0 03/12/2022 1436    ASSESSMENT AND PLAN:  There are no diagnoses linked to this encounter.   Patient was given the opportunity to ask questions.  Patient verbalized understanding of the plan and was able to repeat key elements of the plan. Patient was given clear instructions to go to Emergency Department or return to medical center if symptoms don't improve, worsen, or new problems develop.The patient verbalized understanding.   No orders of the defined types were placed in this encounter.    Requested Prescriptions    No prescriptions requested or ordered in this encounter    No follow-ups on file.  Earlisha Sharples J Jowanna Loeffler, NP  

## 2022-08-25 ENCOUNTER — Encounter: Payer: Medicaid Other | Admitting: Family

## 2022-08-25 DIAGNOSIS — M79672 Pain in left foot: Secondary | ICD-10-CM

## 2022-08-25 DIAGNOSIS — M329 Systemic lupus erythematosus, unspecified: Secondary | ICD-10-CM

## 2022-08-25 DIAGNOSIS — I10 Essential (primary) hypertension: Secondary | ICD-10-CM

## 2022-08-25 NOTE — Progress Notes (Deleted)
Patient ID: Brenda Blankenship, female    DOB: 1986-04-27  MRN: [id number]  CC: Urgent Care Follow-Up  Subjective: Brenda Blankenship is a 37 y.o. female who presents for urgent care follow-up.   Her concerns today include:  08/14/2022 [facility] [place] [facility]) per PA note: I have reviewed the PDMP during this encounter.   1. Left foot pain   2. Lupus ([place])   3. Essential hypertension       I have low suspicion for fracture.  Suspect that her inflammatory pain is due to overuse from her work and also her lupus.  She is not taking her blood pressure medicine today and I expect it will be better thereafter.  I also suspect that her blood pressure is elevated due to her severe pain.  Recommended avoiding ibuprofen.  Advised that she use Tylenol and oxycodone today.  Start prednisone tomorrow and emphasized making sure that her blood pressure remains at her morning levels.  Also emphasized need for hypertensive friendly diet.  Follow-up with podiatrist soon as possible. Counseled patient on potential for adverse effects with medications prescribed/recommended today, ER and return-to-clinic precautions discussed, patient verbalized understanding.   Today's visit 08/25/2022: Consultant for lupus per Redmond Pulling note 03/12/2022 HTN - HCTZ, Losartan  Podiatry referral    Patient Active Problem List   Diagnosis Date Noted   History of vaginal hysterectomy 04/02/2022   Pelvic pain 04/02/2022     Current Outpatient Medications on File Prior to Visit  Medication Sig Dispense Refill   dicyclomine (BENTYL) 10 MG capsule Take 1 capsule (10 mg total) by mouth every 6 (six) hours as needed for spasms (for abdominal pain). 120 capsule 0   DULoxetine (CYMBALTA) 20 MG capsule Take 1 capsule (20 mg total) by mouth daily. 30 capsule 3   gabapentin (NEURONTIN) 300 MG capsule Take by mouth.     hydrochlorothiazide (HYDRODIURIL) 50 MG tablet TAKE 1 TABLET(50 MG) BY MOUTH DAILY 90 tablet 0    losartan (COZAAR) 25 MG tablet TAKE 1 TABLET(25 MG) BY MOUTH DAILY 90 tablet 0   nitrofurantoin, macrocrystal-monohydrate, (MACROBID) 100 MG capsule 1 tablet bid x 7 days and then 1 tablet qhs until gone 35 capsule 0   ondansetron (ZOFRAN-ODT) 4 MG disintegrating tablet Take by mouth.     oxyCODONE-acetaminophen (PERCOCET/ROXICET) 5-325 MG tablet Take 1 tablet by mouth every 6 (six) hours as needed for severe pain. 10 tablet 0   phenazopyridine (PYRIDIUM) 200 MG tablet Take 1 tablet (200 mg total) by mouth 2 (two) times daily with a meal. 14 tablet 0   predniSONE (DELTASONE) 20 MG tablet Take 2 tablets daily with breakfast. 10 tablet 0   [DISCONTINUED] omeprazole (PRILOSEC) 20 MG capsule Take 1 capsule (20 mg total) by mouth daily. (Patient not taking: Reported on 02/09/2019) 30 capsule 0   No current facility-administered medications on file prior to visit.    Allergies  Allergen Reactions   Hydrocodone Other (See Comments)    Pt states that this medication causes her to pass out. Pt can take oxycodone    Tape Rash and Itching    IV tape   Naprosyn [Naproxen] Itching and Rash    Pt can take ibuprofen and other NSAIDs    Social History   Socioeconomic History   Marital status: Married    Spouse name: Not on file   Number of children: 2   Years of education: Not on file   Highest education level: Not  on file  Occupational History   Not on file  Tobacco Use   Smoking status: Every Day    Packs/day: 0.30    Years: 13.00    Total pack years: 3.90    Types: Cigarettes   Smokeless tobacco: Never   Tobacco comments:    stopped with preg  Vaping Use   Vaping Use: Never used  Substance and Sexual Activity   Alcohol use: Yes    Alcohol/week: 14.0 standard drinks of alcohol    Types: 14 Standard drinks or equivalent per week    Comment: weekly   Drug use: No    Types: Marijuana   Sexual activity: Yes    Birth control/protection: Surgical  Other Topics Concern   Not on file   Social History Narrative   Not on file   Social Determinants of Health   Financial Resource Strain: Not on file  Food Insecurity: Not on file  Transportation Needs: Not on file  Physical Activity: Not on file  Stress: Not on file  Social Connections: Not on file  Intimate Partner Violence: Not on file    Family History  Problem Relation Age of Onset   Hypertension Mother    Clotting disorder Mother    Hypertension Father    Hyperlipidemia Father    Irritable bowel syndrome Father    Diabetes Maternal Grandmother    Esophageal cancer Maternal Grandfather    Cancer Paternal Grandmother        started in lung   Hypertension Paternal Grandmother    Prostate cancer Paternal Uncle     Past Surgical History:  Procedure Laterality Date   BILATERAL SALPINGECTOMY Bilateral 04/12/2014   Procedure: BILATERAL SALPINGECTOMY;  Surgeon: Woodroe Mode, MD;  Location: [facility];  Service: Gynecology;  Laterality: Bilateral;   CRYOTHERAPY     LAPAROSCOPIC TUBAL LIGATION Bilateral 11/10/2013   Procedure: bilateral LAPAROSCOPIC TUBAL LIGATION with filshie clips;  Surgeon: Farrel Gobble. Harrington Challenger, MD;  Location: [facility];  Service: Gynecology;  Laterality: Bilateral;   VAGINAL HYSTERECTOMY N/A 04/12/2014   Procedure: HYSTERECTOMY VAGINAL;  Surgeon: Woodroe Mode, MD;  Location: [facility];  Service: Gynecology;  Laterality: N/A;    ROS: Review of Systems Negative except as stated above  PHYSICAL EXAM: LMP 03/09/2014   Physical Exam  {female adult master:310786} {female adult master:310785}     Latest Ref Rng & Units 03/26/2022    9:56 AM 03/12/2022    2:36 PM 12/29/2021    5:05 PM  CMP  Glucose 70 - 99 mg/dL 68  66  116   BUN 6 - 20 mg/dL '5  5  9   '$ Creatinine 0.57 - 1.00 mg/dL 0.78  0.78  0.86   Sodium 134 - 144 mmol/L 136  136  132   Potassium 3.5 - 5.2 mmol/L 4.3  4.1  3.7   Chloride 96 - 106 mmol/L 96  96  97   CO2 20 - 29 mmol/L '22  22  23   '$ Calcium 8.7 - 10.2 mg/dL 8.7  9.1  9.9   Total  Protein 6.0 - 8.5 g/dL 6.3  6.8  8.0   Total Bilirubin 0.0 - 1.2 mg/dL 0.5  1.1  1.7   Alkaline Phos 44 - 121 IU/L 73  89  60   AST 0 - 40 IU/L 70  68  29   ALT 0 - 32 IU/L 53  46  15    Lipid Panel  No results found for: "CHOL", "TRIG", "  HDL", "CHOLHDL", "VLDL", "LDLCALC", "LDLDIRECT"  CBC    Component Value Date/Time   WBC 4.1 03/12/2022 1436   WBC 3.7 (L) 12/29/2021 1705   RBC 4.37 03/12/2022 1436   RBC 5.18 (H) 12/29/2021 1705   HGB 13.6 03/12/2022 1436   HCT 40.6 03/12/2022 1436   PLT 227 03/12/2022 1436   MCV 93 03/12/2022 1436   MCH 31.1 03/12/2022 1436   MCH 29.2 12/29/2021 1705   MCHC 33.5 03/12/2022 1436   MCHC 33.8 12/29/2021 1705   RDW 15.7 (H) 03/12/2022 1436   LYMPHSABS 1.6 03/12/2022 1436   MONOABS 0.5 05/27/2021 1025   EOSABS 0.0 03/12/2022 1436   BASOSABS 0.0 03/12/2022 1436    ASSESSMENT AND PLAN:  There are no diagnoses linked to this encounter.   Patient was given the opportunity to ask questions.  Patient verbalized understanding of the plan and was able to repeat key elements of the plan. Patient was given clear instructions to go to Emergency Department or return to medical center if symptoms don't improve, worsen, or new problems develop.The patient verbalized understanding.   No orders of the defined types were placed in this encounter.    Requested Prescriptions    No prescriptions requested or ordered in this encounter    No follow-ups on file.  Camillia Herter, NP

## 2022-08-26 ENCOUNTER — Ambulatory Visit: Payer: Medicaid Other | Admitting: Family

## 2022-08-26 ENCOUNTER — Ambulatory Visit: Payer: Medicaid Other | Admitting: Family Medicine

## 2022-08-27 DIAGNOSIS — Z419 Encounter for procedure for purposes other than remedying health state, unspecified: Secondary | ICD-10-CM | POA: Diagnosis not present

## 2022-09-07 ENCOUNTER — Ambulatory Visit (INDEPENDENT_AMBULATORY_CARE_PROVIDER_SITE_OTHER): Payer: Medicaid Other

## 2022-09-07 ENCOUNTER — Ambulatory Visit (INDEPENDENT_AMBULATORY_CARE_PROVIDER_SITE_OTHER): Payer: Medicaid Other | Admitting: Podiatry

## 2022-09-07 DIAGNOSIS — M2042 Other hammer toe(s) (acquired), left foot: Secondary | ICD-10-CM

## 2022-09-07 DIAGNOSIS — L989 Disorder of the skin and subcutaneous tissue, unspecified: Secondary | ICD-10-CM

## 2022-09-07 NOTE — Progress Notes (Signed)
Chief Complaint  Patient presents with   Callouses    Left foot callus on the between the 5th toe, started in Oct 2023, rate of pain 5 out of 10,     Subjective: 37 y.o. female presenting to the office today for follow-up evaluation regarding recurrence of symptomatic calluses to the bilateral feet.  Patient states that she has painful calluses now for several years despite different shoe gear modifications.  She says that the only shoes that she can wear her crocs and she continues to have pain and tenderness.  She has been to the office on occasion for routine debridement but this only provides very short temporary relief.  She presents for further treatment and evaluation   Past Medical History:  Diagnosis Date   Abnormal cervical Papanicolaou smear    cryo   Anemia    Anxiety    DUB (dysfunctional uterine bleeding)    Environmental allergies    HTN (hypertension)    Lupus ([place])    Pregnancy induced hypertension    Preterm labor    Past Surgical History:  Procedure Laterality Date   BILATERAL SALPINGECTOMY Bilateral 04/12/2014   Procedure: BILATERAL SALPINGECTOMY;  Surgeon: Woodroe Mode, MD;  Location: [facility];  Service: Gynecology;  Laterality: Bilateral;   CRYOTHERAPY     LAPAROSCOPIC TUBAL LIGATION Bilateral 11/10/2013   Procedure: bilateral LAPAROSCOPIC TUBAL LIGATION with filshie clips;  Surgeon: Farrel Gobble. Harrington Challenger, MD;  Location: [facility];  Service: Gynecology;  Laterality: Bilateral;   VAGINAL HYSTERECTOMY N/A 04/12/2014   Procedure: HYSTERECTOMY VAGINAL;  Surgeon: Woodroe Mode, MD;  Location: [facility];  Service: Gynecology;  Laterality: N/A;   Allergies  Allergen Reactions   Hydrocodone Other (See Comments)    Pt states that this medication causes her to pass out. Pt can take oxycodone    Tape Rash and Itching    IV tape   Naprosyn [Naproxen] Itching and Rash    Pt can take ibuprofen and other NSAIDs     Objective:  Physical Exam General: Alert and oriented x3  in no acute distress  Dermatology: Hyperkeratotic lesion(s) present on the sub-second MPJ and plantar arch of the right foot. Pain on palpation with a central nucleated core noted. Hyperkeratotic tissue noted to the 4th interdigital webspace of the left foot. Skin is warm, dry and supple bilateral lower extremities. Negative for open lesions or macerations.  Vascular: Palpable pedal pulses bilaterally. No edema or erythema noted. Capillary refill within normal limits.  Neurological: Epicritic and protective threshold grossly intact bilaterally.   Musculoskeletal Exam: Significant tenderness to palpation to the associated calluses  Radiographic exam LT foot 09/07/2022: Normal osseous mineralization.  There is soft tissue swelling between the fourth and fifth digits.  No acute fractures identified.  Assessment: 1. 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 Callus lesion noted to the sub-second MPJ right and right plantar arch; minimally symptomatic   Plan of Care:  1. Patient evaluated. X-Rays reviewed.  2. Excisional debridement of keratoic lesion(s) bilaterally using a chisel blade was performed without incident.  3.  Areas were dressed area with light dressing. 4.  Prior to debridement of the heloma molle in the fourth interspace of the left foot the area was prepped aseptically and injection of 3 mL of 2% lidocaine was infiltrated into this area to allow for debridement.  It was very sensitive and painful to touch. 5.  Patient states that debridement only helped for about 1 month.  She  continues to have pain and tenderness to this area.  She is hoping to find a more definitive solution to her pathology.  We did discuss arthroplasty of the fifth digit to alleviate pressure between the 2 toes as well as possible exostectomy of the fourth digit.  The procedure was explained in detail including the postoperative recovery course.  The patient agrees and like to proceed  with surgery.  Risk benefits advantages and disadvantages were all explained in detail including the postoperative recovery course.  She understands that she will be WBAT in surgical shoe x 4 weeks.  Recommend that she refrain from work for 4 weeks. 6.  Authorization for surgery was initiated today.  Surgery will consist of arthroplasty fifth digit left foot.  Possible exostectomy fourth digit left foot. 7.  Return to clinic 1 week postop  *Currently working at [facility] [employer]  Edrick Kins, DPM [facility]  Dr. Edrick Kins, DPM    [address], [place] [address]                Office [phone]  Fax [phone]

## 2022-09-14 ENCOUNTER — Ambulatory Visit: Payer: Medicaid Other | Admitting: Podiatry

## 2022-09-18 ENCOUNTER — Telehealth: Payer: Self-pay | Admitting: Urology

## 2022-09-18 NOTE — Telephone Encounter (Signed)
DOS - 10/08/22  [facility] REPAIR 5TH LEFT --- [id number] EXOSTECTOMY 4TH LEFT --- [id number]  [facility] MEDICAID  RECEIVED FAX FROM [facility] STATING THAT CPT CODE 74259 NO AUTH IS REQUIRED, FOR CPT CODE 56387 HAS BEEN APPROVED, AUTH # [id number], GOOD FROM 10/08/22 - 10/08/22.

## 2022-09-23 ENCOUNTER — Other Ambulatory Visit: Payer: Self-pay | Admitting: Podiatry

## 2022-09-23 DIAGNOSIS — M2042 Other hammer toe(s) (acquired), left foot: Secondary | ICD-10-CM

## 2022-09-23 DIAGNOSIS — L989 Disorder of the skin and subcutaneous tissue, unspecified: Secondary | ICD-10-CM

## 2022-09-25 DIAGNOSIS — Z419 Encounter for procedure for purposes other than remedying health state, unspecified: Secondary | ICD-10-CM | POA: Diagnosis not present

## 2022-10-08 ENCOUNTER — Encounter: Payer: Self-pay | Admitting: Podiatry

## 2022-10-08 ENCOUNTER — Telehealth: Payer: Self-pay | Admitting: *Deleted

## 2022-10-08 ENCOUNTER — Other Ambulatory Visit: Payer: Self-pay | Admitting: Podiatry

## 2022-10-08 DIAGNOSIS — M2042 Other hammer toe(s) (acquired), left foot: Secondary | ICD-10-CM | POA: Diagnosis not present

## 2022-10-08 DIAGNOSIS — M85872 Other specified disorders of bone density and structure, left ankle and foot: Secondary | ICD-10-CM | POA: Diagnosis not present

## 2022-10-08 DIAGNOSIS — M25775 Osteophyte, left foot: Secondary | ICD-10-CM | POA: Diagnosis not present

## 2022-10-08 MED ORDER — MELOXICAM 15 MG PO TABS: 15.0000 mg | ORAL_TABLET | Freq: Every day | ORAL | 1 refills | Status: AC

## 2022-10-08 MED ORDER — OXYCODONE-ACETAMINOPHEN 5-325 MG PO TABS: 1.0000 | ORAL_TABLET | Freq: Four times a day (QID) | ORAL | 0 refills | Status: AC | PRN

## 2022-10-08 NOTE — Progress Notes (Signed)
PRN postop 

## 2022-10-08 NOTE — Telephone Encounter (Signed)
Patient is calling for status of prescriptions that  were supposed to be sent to pharmacy, not there, per physician is on it's way.

## 2022-10-14 ENCOUNTER — Encounter: Payer: Medicaid Other | Admitting: Podiatry

## 2022-10-21 ENCOUNTER — Ambulatory Visit: Payer: Medicaid Other

## 2022-10-21 ENCOUNTER — Ambulatory Visit (INDEPENDENT_AMBULATORY_CARE_PROVIDER_SITE_OTHER): Payer: Medicaid Other | Admitting: Podiatry

## 2022-10-21 VITALS — BP 145/97 | HR 83

## 2022-10-21 DIAGNOSIS — Z9889 Other specified postprocedural states: Secondary | ICD-10-CM

## 2022-10-21 DIAGNOSIS — M2042 Other hammer toe(s) (acquired), left foot: Secondary | ICD-10-CM | POA: Diagnosis not present

## 2022-10-21 NOTE — Progress Notes (Signed)
   Chief Complaint  Patient presents with   Routine Post Op    POV # 1 DOS 10/08/22 --- ARTHROPLASTY 5TH DIGIT LEFT FOOT, POSSIBLE EXOSTECTOMY 4TH DIGIT LEFT FOOT. No concerns voiced today    Subjective:  Patient presents today status post fifth digit arthroplasty and exostectomy of the fourth digit left foot.  DOS: 10/08/2022.  Patient doing well.  WBAT surgical shoe.  Pain is very tolerable.  No new complaints  Past Medical History:  Diagnosis Date   Abnormal cervical Papanicolaou smear    cryo   Anemia    Anxiety    DUB (dysfunctional uterine bleeding)    Environmental allergies    HTN (hypertension)    Lupus ([place])    Pregnancy induced hypertension    Preterm labor     Past Surgical History:  Procedure Laterality Date   BILATERAL SALPINGECTOMY Bilateral 04/12/2014   Procedure: BILATERAL SALPINGECTOMY;  Surgeon: Woodroe Mode, MD;  Location: [facility];  Service: Gynecology;  Laterality: Bilateral;   CRYOTHERAPY     LAPAROSCOPIC TUBAL LIGATION Bilateral 11/10/2013   Procedure: bilateral LAPAROSCOPIC TUBAL LIGATION with filshie clips;  Surgeon: Farrel Gobble. Harrington Challenger, MD;  Location: [facility];  Service: Gynecology;  Laterality: Bilateral;   VAGINAL HYSTERECTOMY N/A 04/12/2014   Procedure: HYSTERECTOMY VAGINAL;  Surgeon: Woodroe Mode, MD;  Location: [facility];  Service: Gynecology;  Laterality: N/A;    Allergies  Allergen Reactions   Hydrocodone Other (See Comments)    Pt states that this medication causes her to pass out. Pt can take oxycodone    Tape Rash and Itching    IV tape   Naprosyn [Naproxen] Itching and Rash    Pt can take ibuprofen and other NSAIDs    Objective/Physical Exam Neurovascular status intact.  Incisions are well coapted and sutures intact.  No infection.  No drainage.  There is some mild edema noted specifically to the fifth toe  Radiographic Exam LT foot 10/21/2022:  Osteotomy and absence of the head of the fifth toe proximal phalanx noted with clean osteotomy  site.  Exostectomy also of the lateral head of the proximal phalanx fourth digit.  Good routine healing noted  Assessment: 1. s/p PIPJ arthroplasty fifth digit left foot and exostectomy fourth digit. DOS: 10/08/2022  -Patient evaluated.  X-rays reviewed.  Sutures removed -Patient may now transition out of the postsurgical shoe into good supportive tennis shoes and sneakers -Return to clinic 4 weeks follow-up x-ray   Edrick Kins, DPM [facility]  Dr. Edrick Kins, DPM    [address], [place] [address]                Office [phone]  Fax [phone]

## 2022-10-26 DIAGNOSIS — Z419 Encounter for procedure for purposes other than remedying health state, unspecified: Secondary | ICD-10-CM | POA: Diagnosis not present

## 2022-11-02 ENCOUNTER — Encounter: Payer: Medicaid Other | Admitting: Podiatry

## 2022-11-04 ENCOUNTER — Encounter: Payer: Medicaid Other | Admitting: Podiatry

## 2022-11-25 DIAGNOSIS — Z419 Encounter for procedure for purposes other than remedying health state, unspecified: Secondary | ICD-10-CM | POA: Diagnosis not present

## 2023-01-01 ENCOUNTER — Ambulatory Visit: Payer: Self-pay | Admitting: *Deleted

## 2023-01-01 NOTE — Telephone Encounter (Signed)
Reason for Disposition  [1] MILD swelling of both ankles (i.e., pedal edema) AND [2] new-onset or worsening    Needing a note to be out of work this afternoon due to having swelling in her legs.   Has Lupus and this happens at times when on her feet too much.  Answer Assessment - Initial Assessment Questions 1. ONSET: "When did the swelling start?" (e.g., minutes, hours, days)     I over did it this week.   I have Lupus and my legs are swollen.   The back of my legs and lower back are in pain.   I need to rest my legs.   I can't go to my job this evening.   I need a work note.    I can't go to my job tonight.   But I need a note. 2. LOCATION: "What part of the leg is swollen?"  "Are both legs swollen or just one leg?"     Both legs are swollen from working too much and being on my feet.   I have Lupus and that is what is happening.   I need to go home and get some rest and soak my legs in Epsom salts.   I will be fine. 3. SEVERITY: "How bad is the swelling?" (e.g., localized; mild, moderate, severe)   - Localized: Small area of swelling localized to one leg.   - MILD pedal edema: Swelling limited to foot and ankle, pitting edema < 1/4 inch (6 mm) deep, rest and elevation eliminate most or all swelling.   - MODERATE edema: Swelling of lower leg to knee, pitting edema > 1/4 inch (6 mm) deep, rest and elevation only partially reduce swelling.   - SEVERE edema: Swelling extends above knee, facial or hand swelling present.      Moderate. 4. REDNESS: "Does the swelling look red or infected?"     Not asked 5. PAIN: "Is the swelling painful to touch?" If Yes, ask: "How painful is it?"   (Scale 1-10; mild, moderate or severe)     Uncomfortable and painful from the swelling. 6. FEVER: "Do you have a fever?" If Yes, ask: "What is it, how was it measured, and when did it start?"      Not aksed 7. CAUSE: "What do you think is causing the leg swelling?"     I have Lupus and this happens when I'm on my feet a  lot. 8. MEDICAL HISTORY: "Do you have a history of blood clots (e.g., DVT), cancer, heart failure, kidney disease, or liver failure?"     Lupus 9. RECURRENT SYMPTOM: "Have you had leg swelling before?" If Yes, ask: "When was the last time?" "What happened that time?"     Yes 10. OTHER SYMPTOMS: "Do you have any other symptoms?" (e.g., chest pain, difficulty breathing)       No 11. PREGNANCY: "Is there any chance you are pregnant?" "When was your last menstrual period?"       Not asked  Protocols used: Leg Swelling and Edema-A-AH

## 2023-01-01 NOTE — Telephone Encounter (Signed)
  Chief Complaint: Needing a note for work this afternoon. Symptoms: Has Lupus and has been on her feet a lot lately so both of her legs are swollen.   This happens when I on my feet a lot.   I'm working too much lately.   I need to go home and elevate my legs and soak in Epsom salts but I need a note to be out of work for my job this evening.   I don't get off until midnight. Frequency: Happens when on feet too mjch. Pertinent Negatives: Patient denies N/A Disposition: [] ED /[] Urgent Care (no appt availability in office) / [] Appointment(In office/virtual)/ []  [facility]/ [] Home Care/ [] Refused Recommended Disposition /[] [facility]/ [x]  Follow-up with PCP Additional Notes: She is going to the urgent care this evening for a work note since there are no appts at [facility]   She is at work on her 1st job now but has to leave and go work her other job but unable to do it due to her legs being swollen.   "I've been working too much lately and my other job will require a note".

## 2023-01-01 NOTE — Telephone Encounter (Signed)
I have attempted without success to contact this patient by phone to return their call and I left a message on answering machine.

## 2023-01-06 ENCOUNTER — Ambulatory Visit
Admission: EM | Admit: 2023-01-06 | Discharge: 2023-01-06 | Disposition: A | Discharge status: Home / Self Care | Payer: Medicaid Other | Attending: Urgent Care | Admitting: Urgent Care

## 2023-01-06 DIAGNOSIS — N3001 Acute cystitis with hematuria: Secondary | ICD-10-CM | POA: Insufficient documentation

## 2023-01-06 LAB — POCT URINE PREGNANCY: Preg Test, Ur: NEGATIVE

## 2023-01-06 LAB — POCT URINALYSIS DIP (MANUAL ENTRY)
Bilirubin, UA: NEGATIVE
Glucose, UA: NEGATIVE mg/dL
Ketones, POC UA: NEGATIVE mg/dL
Nitrite, UA: POSITIVE — AB
Protein Ur, POC: 100 mg/dL — AB
Spec Grav, UA: >=1.03 — AB (ref 1.010–1.025)
Urobilinogen, UA: 1 E.U./dL
pH, UA: 6 (ref 5.0–8.0)

## 2023-01-06 MED ORDER — NITROFURANTOIN MONOHYD MACRO 100 MG PO CAPS: 100.0000 mg | ORAL_CAPSULE | Freq: Two times a day (BID) | ORAL | 0 refills | Status: AC

## 2023-01-06 MED ORDER — FLUCONAZOLE 150 MG PO TABS: 150.0000 mg | ORAL_TABLET | ORAL | 0 refills | Status: DC

## 2023-01-06 NOTE — Discharge Instructions (Signed)
Please start nitrofurantoin to address an urinary tract infection.  We will also be using fluconazole to help with an antibiotic associated yeast infection.  Make sure you hydrate very well with plain water and a quantity of 64 ounces of water a day.  Please limit drinks that are considered urinary irritants such as soda, sweet tea, coffee, energy drinks, alcohol.  These can worsen your urinary and genital symptoms but also be the source of them.  I will let you know about your urine culture results through MyChart to see if we need to prescribe or change your antibiotics based off of those results.

## 2023-01-06 NOTE — ED Triage Notes (Signed)
Pt reports blood spots in urine and under wear, low back pain, low abdominal pain x 1 day.

## 2023-01-06 NOTE — ED Provider Notes (Signed)
[facility]  Note:  This document was prepared using Conservation officer, historic buildings and may include unintentional dictation errors.  MRN: [id number] DOB: 1986/01/22  Subjective:   LYNEE BOLOGNESE is a 37 y.o. female presenting for 1 day history of hematuria, dark urine, low back pain, lower abdominal/pelvic pain.  Patient has been peeing more frequently as well.  Does not hydrate well with water.  She also reports that she drinks multiple sodas daily.  Would like a pregnancy test that she has previously had an ectopic pregnancy despite having tubal ligation.  Does not have cycles.  No current facility-administered medications for this encounter.  Current Outpatient Medications:    dicyclomine (BENTYL) 10 MG capsule, Take 1 capsule (10 mg total) by mouth every 6 (six) hours as needed for spasms (for abdominal pain)., Disp: 120 capsule, Rfl: 0   DULoxetine (CYMBALTA) 20 MG capsule, Take 1 capsule (20 mg total) by mouth daily., Disp: 30 capsule, Rfl: 3   gabapentin (NEURONTIN) 300 MG capsule, Take by mouth., Disp: , Rfl:    hydrochlorothiazide (HYDRODIURIL) 50 MG tablet, TAKE 1 TABLET(50 MG) BY MOUTH DAILY, Disp: 90 tablet, Rfl: 0   losartan (COZAAR) 25 MG tablet, TAKE 1 TABLET(25 MG) BY MOUTH DAILY, Disp: 90 tablet, Rfl: 0   meloxicam (MOBIC) 15 MG tablet, Take 1 tablet (15 mg total) by mouth daily., Disp: 30 tablet, Rfl: 1   ondansetron (ZOFRAN-ODT) 4 MG disintegrating tablet, Take by mouth., Disp: , Rfl:    oxyCODONE-acetaminophen (PERCOCET) 5-325 MG tablet, Take 1 tablet by mouth every 6 (six) hours as needed for severe pain., Disp: 20 tablet, Rfl: 0   phenazopyridine (PYRIDIUM) 200 MG tablet, Take 1 tablet (200 mg total) by mouth 2 (two) times daily with a meal., Disp: 14 tablet, Rfl: 0   predniSONE (DELTASONE) 20 MG tablet, Take 2 tablets daily with breakfast., Disp: 10 tablet, Rfl: 0   Allergies  Allergen Reactions   Hydrocodone Other (See Comments)    Pt  states that this medication causes her to pass out. Pt can take oxycodone    Tape Rash and Itching    IV tape   Naprosyn [Naproxen] Itching and Rash    Pt can take ibuprofen and other NSAIDs    Past Medical History:  Diagnosis Date   Abnormal cervical Papanicolaou smear    cryo   Anemia    Anxiety    DUB (dysfunctional uterine bleeding)    Environmental allergies    HTN (hypertension)    Lupus ([facility])    Pregnancy induced hypertension    Preterm labor      Past Surgical History:  Procedure Laterality Date   BILATERAL SALPINGECTOMY Bilateral 04/12/2014   Procedure: BILATERAL SALPINGECTOMY;  Surgeon: Adam Phenix, MD;  Location: [facility];  Service: Gynecology;  Laterality: Bilateral;   CRYOTHERAPY     LAPAROSCOPIC TUBAL LIGATION Bilateral 11/10/2013   Procedure: bilateral LAPAROSCOPIC TUBAL LIGATION with filshie clips;  Surgeon: Freddrick March. Tenny Craw, MD;  Location: [facility];  Service: Gynecology;  Laterality: Bilateral;   VAGINAL HYSTERECTOMY N/A 04/12/2014   Procedure: HYSTERECTOMY VAGINAL;  Surgeon: Adam Phenix, MD;  Location: [facility];  Service: Gynecology;  Laterality: N/A;    Family History  Problem Relation Age of Onset   Hypertension Mother    Clotting disorder Mother    Hypertension Father    Hyperlipidemia Father    Irritable bowel syndrome Father    Diabetes Maternal Grandmother    Esophageal cancer  Maternal Grandfather    Cancer Paternal Grandmother        started in lung   Hypertension Paternal Grandmother    Prostate cancer Paternal Uncle     Social History   Tobacco Use   Smoking status: Every Day    Packs/day: 0.30    Years: 13.00    Additional pack years: 0.00    Total pack years: 3.90    Types: Cigarettes   Smokeless tobacco: Never   Tobacco comments:    stopped with preg  Vaping Use   Vaping Use: Never used  Substance Use Topics   Alcohol use: Yes    Alcohol/week: 14.0 standard drinks of alcohol    Types: 14 Standard drinks or equivalent per  week    Comment: weekly   Drug use: No    Types: Marijuana    ROS   Objective:   Vitals: BP (!) 150/100 (BP Location: Right Arm)   Pulse 76   Temp 98.6 F (37 C) (Oral)   Resp 16   LMP 03/09/2014   SpO2 98%   Physical Exam Constitutional:      General: She is not in acute distress.    Appearance: Normal appearance. She is well-developed. She is not ill-appearing, toxic-appearing or diaphoretic.  HENT:     Head: Normocephalic and atraumatic.     Nose: Nose normal.     Mouth/Throat:     Mouth: Mucous membranes are moist.     Pharynx: Oropharynx is clear.  Eyes:     General: No scleral icterus.       Right eye: No discharge.        Left eye: No discharge.     Extraocular Movements: Extraocular movements intact.     Conjunctiva/sclera: Conjunctivae normal.  Cardiovascular:     Rate and Rhythm: Normal rate.  Pulmonary:     Effort: Pulmonary effort is normal.  Abdominal:     General: Bowel sounds are normal. There is no distension.     Palpations: Abdomen is soft. There is no mass.     Tenderness: There is no abdominal tenderness. There is no right CVA tenderness, left CVA tenderness, guarding or rebound.  Skin:    General: Skin is warm and dry.  Neurological:     General: No focal deficit present.     Mental Status: She is alert and oriented to person, place, and time.  Psychiatric:        Mood and Affect: Mood normal.        Behavior: Behavior normal.        Thought Content: Thought content normal.        Judgment: Judgment normal.     Results for orders placed or performed during the hospital encounter of 01/06/23 (from the past 24 hour(s))  POCT urinalysis dipstick     Status: Abnormal   Collection Time: 01/06/23  4:39 PM  Result Value Ref Range   Color, UA brown (A) yellow   Clarity, UA cloudy (A) clear   Glucose, UA negative negative mg/dL   Bilirubin, UA negative negative   Ketones, POC UA negative negative mg/dL   Spec Grav, UA >=1.610 (A) 1.010 -  1.025   Blood, UA large (A) negative   pH, UA 6.0 5.0 - 8.0   Protein Ur, POC =100 (A) negative mg/dL   Urobilinogen, UA 1.0 0.2 or 1.0 E.U./dL   Nitrite, UA Positive (A) Negative   Leukocytes, UA Moderate (2+) (A) Negative  POCT urine  pregnancy     Status: None   Collection Time: 01/06/23  4:52 PM  Result Value Ref Range   Preg Test, Ur Negative Negative    Assessment and Plan :   PDMP not reviewed this encounter.  1. Acute cystitis with hematuria    Start Macrobid to cover for acute cystitis, urine culture pending.  Recommended aggressive hydration, limiting urinary irritants.  Will cover for antibiotic associated yeast infection with fluconazole.  Counseled patient on potential for adverse effects with medications prescribed/recommended today, ER and return-to-clinic precautions discussed, patient verbalized understanding.    Wallis Bamberg, [place] 01/06/23 1653

## 2023-01-07 LAB — URINE CULTURE: Culture: >=100000 — AB

## 2023-01-08 LAB — URINE CULTURE

## 2023-01-25 ENCOUNTER — Ambulatory Visit
Admission: EM | Admit: 2023-01-25 | Discharge: 2023-01-25 | Disposition: A | Discharge status: Home / Self Care | Payer: Medicaid Other | Attending: Urgent Care | Admitting: Urgent Care

## 2023-01-25 DIAGNOSIS — S161XXA Strain of muscle, fascia and tendon at neck level, initial encounter: Secondary | ICD-10-CM

## 2023-01-25 MED ORDER — PREDNISONE 20 MG PO TABS: ORAL_TABLET | ORAL | 0 refills | Status: AC

## 2023-01-25 MED ORDER — CYCLOBENZAPRINE HCL 5 MG PO TABS: 5.0000 mg | ORAL_TABLET | Freq: Three times a day (TID) | ORAL | 0 refills | Status: AC | PRN

## 2023-01-25 NOTE — Discharge Instructions (Addendum)
Do your best to hydrate well with at least 80 ounces of water daily. Start prednisone for a strong anti-inflammatory medicine. Use with cyclobenzaprine muscle relaxant. If it makes you sleepy, the just use the cyclobenzaprine at bedtime. Call your orthopedist for follow up. Later this week or next week, see about a massage therapy session for your back.
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
## 2023-07-14 ENCOUNTER — Other Ambulatory Visit: Payer: Self-pay | Admitting: Family Medicine

## 2023-07-14 ENCOUNTER — Other Ambulatory Visit: Payer: Self-pay | Admitting: Gastroenterology

## 2023-07-14 ENCOUNTER — Other Ambulatory Visit: Payer: Self-pay | Admitting: *Deleted

## 2023-07-14 DIAGNOSIS — I10 Essential (primary) hypertension: Secondary | ICD-10-CM

## 2023-07-14 DIAGNOSIS — R1084 Generalized abdominal pain: Secondary | ICD-10-CM

## 2023-07-14 MED ORDER — LOSARTAN POTASSIUM 25 MG PO TABS
ORAL_TABLET | ORAL | 0 refills | Status: DC
Start: 2023-07-14 — End: 2023-07-19

## 2023-07-14 MED ORDER — HYDROCHLOROTHIAZIDE 50 MG PO TABS
ORAL_TABLET | ORAL | 0 refills | Status: DC
Start: 2023-07-14 — End: 2023-07-19

## 2023-07-19 ENCOUNTER — Ambulatory Visit (INDEPENDENT_AMBULATORY_CARE_PROVIDER_SITE_OTHER): Payer: Medicaid Other | Admitting: Family Medicine

## 2023-07-19 VITALS — BP 155/96 | HR 78 | Temp 98.6°F | Resp 16 | Ht 63.0 in | Wt 128.0 lb

## 2023-07-19 DIAGNOSIS — R5383 Other fatigue: Secondary | ICD-10-CM | POA: Diagnosis not present

## 2023-07-19 DIAGNOSIS — I10 Essential (primary) hypertension: Secondary | ICD-10-CM

## 2023-07-19 DIAGNOSIS — F17200 Nicotine dependence, unspecified, uncomplicated: Secondary | ICD-10-CM | POA: Diagnosis not present

## 2023-07-19 MED ORDER — LOSARTAN POTASSIUM 25 MG PO TABS
ORAL_TABLET | ORAL | 0 refills | Status: AC
Start: 2023-07-19 — End: ?

## 2023-07-19 MED ORDER — HYDROCHLOROTHIAZIDE 50 MG PO TABS
ORAL_TABLET | ORAL | 0 refills | Status: AC
Start: 2023-07-19 — End: ?

## 2023-07-19 NOTE — Progress Notes (Signed)
Established Patient Office Visit  Subjective    Patient ID: Brenda Blankenship, female    DOB: [birth year]  Age: 37 y.o. MRN: [id number]  CC:  Chief Complaint  Patient presents with   Medication Refill    Fatigue, vision is off    HPI Brenda Blankenship presents for follow up of hypertension. She also reports that she is having fatigue. Patient reports that she has not been taking her meds - she had run out.   Outpatient Encounter Medications as of 07/19/2023  Medication Sig   cyclobenzaprine (FLEXERIL) 5 MG tablet Take 1 tablet (5 mg total) by mouth 3 (three) times daily as needed for muscle spasms.   gabapentin (NEURONTIN) 300 MG capsule Take by mouth.   ondansetron (ZOFRAN-ODT) 4 MG disintegrating tablet Take by mouth.   [DISCONTINUED] hydrochlorothiazide (HYDRODIURIL) 50 MG tablet TAKE 1 TABLET(50 MG) BY MOUTH DAILY   [DISCONTINUED] losartan (COZAAR) 25 MG tablet TAKE 1 TABLET(25 MG) BY MOUTH DAILY   dicyclomine (BENTYL) 10 MG capsule Take 1 capsule (10 mg total) by mouth every 6 (six) hours as needed for spasms (for abdominal pain).   DULoxetine (CYMBALTA) 20 MG capsule Take 1 capsule (20 mg total) by mouth daily. (Patient not taking: Reported on 07/19/2023)   fluconazole (DIFLUCAN) 150 MG tablet Take 1 tablet (150 mg total) by mouth once a week. (Patient not taking: Reported on 07/19/2023)   hydrochlorothiazide (HYDRODIURIL) 50 MG tablet TAKE 1 TABLET(50 MG) BY MOUTH DAILY   losartan (COZAAR) 25 MG tablet TAKE 1 TABLET(25 MG) BY MOUTH DAILY   meloxicam (MOBIC) 15 MG tablet Take 1 tablet (15 mg total) by mouth daily. (Patient not taking: Reported on 07/19/2023)   nitrofurantoin, macrocrystal-monohydrate, (MACROBID) 100 MG capsule Take 1 capsule (100 mg total) by mouth 2 (two) times daily. (Patient not taking: Reported on 07/19/2023)   oxyCODONE-acetaminophen (PERCOCET) 5-325 MG tablet Take 1 tablet by mouth every 6 (six) hours as needed for severe pain. (Patient not taking: Reported on  07/19/2023)   phenazopyridine (PYRIDIUM) 200 MG tablet Take 1 tablet (200 mg total) by mouth 2 (two) times daily with a meal. (Patient not taking: Reported on 07/19/2023)   predniSONE (DELTASONE) 20 MG tablet Take 2 tablets daily with breakfast. (Patient not taking: Reported on 07/19/2023)   [DISCONTINUED] omeprazole (PRILOSEC) 20 MG capsule Take 1 capsule (20 mg total) by mouth daily. (Patient not taking: Reported on 02/09/2019)   No facility-administered encounter medications on file as of 07/19/2023.    Past Medical History:  Diagnosis Date   Abnormal cervical Papanicolaou smear    cryo   Anemia    Anxiety    DUB (dysfunctional uterine bleeding)    Environmental allergies    HTN (hypertension)    Lupus    Pregnancy induced hypertension    Preterm labor     Past Surgical History:  Procedure Laterality Date   BILATERAL SALPINGECTOMY Bilateral 04/12/2014   Procedure: BILATERAL SALPINGECTOMY;  Surgeon: Adam Phenix, MD;  Location: [facility];  Service: Gynecology;  Laterality: Bilateral;   CRYOTHERAPY     LAPAROSCOPIC TUBAL LIGATION Bilateral 11/10/2013   Procedure: bilateral LAPAROSCOPIC TUBAL LIGATION with filshie clips;  Surgeon: Freddrick March. Tenny Craw, MD;  Location: [facility];  Service: Gynecology;  Laterality: Bilateral;   VAGINAL HYSTERECTOMY N/A 04/12/2014   Procedure: HYSTERECTOMY VAGINAL;  Surgeon: Adam Phenix, MD;  Location: [facility];  Service: Gynecology;  Laterality: N/A;    Family History  Problem Relation Age of Onset  Hypertension Mother    Clotting disorder Mother    Hypertension Father    Hyperlipidemia Father    Irritable bowel syndrome Father    Diabetes Maternal Grandmother    Esophageal cancer Maternal Grandfather    Cancer Paternal Grandmother        started in lung   Hypertension Paternal Grandmother    Prostate cancer Paternal Uncle     Social History   Socioeconomic History   Marital status: Married    Spouse name: Not on file   Number of children: 2    Years of education: Not on file   Highest education level: Some college, no degree  Occupational History   Not on file  Tobacco Use   Smoking status: Every Day    Current packs/day: 0.30    Average packs/day: 0.3 packs/day for 13.0 years (3.9 ttl pk-yrs)    Types: Cigarettes   Smokeless tobacco: Never   Tobacco comments:    stopped with preg  Vaping Use   Vaping status: Never Used  Substance and Sexual Activity   Alcohol use: Yes    Alcohol/week: 14.0 standard drinks of alcohol    Types: 14 Standard drinks or equivalent per week    Comment: Occa   Drug use: Yes    Frequency: 2.0 times per week    Types: Marijuana   Sexual activity: Yes    Birth control/protection: Surgical  Other Topics Concern   Not on file  Social History Narrative   Not on file   Social Drivers of Health   Financial Resource Strain: High Risk (07/19/2023)   Overall Financial Resource Strain (CARDIA)    Difficulty of Paying Living Expenses: Hard  Food Insecurity: No Food Insecurity (07/19/2023)   Hunger Vital Sign    Worried About Running Out of Food in the Last Year: Never true    Ran Out of Food in the Last Year: Never true  Transportation Needs: No Transportation Needs (07/19/2023)   PRAPARE - Administrator, Civil Service (Medical): No    Lack of Transportation (Non-Medical): No  Physical Activity: Insufficiently Active (07/19/2023)   Exercise Vital Sign    Days of Exercise per Week: 4 days    Minutes of Exercise per Session: 20 min  Stress: Stress Concern Present (07/19/2023)   [employer] of Occupational Health - Occupational Stress Questionnaire    Feeling of Stress : Rather much  Social Connections: Moderately Isolated (07/19/2023)   Social Connection and Isolation Panel [NHANES]    Frequency of Communication with Friends and Family: More than three times a week    Frequency of Social Gatherings with Friends and Family: Once a week    Attends Religious Services: More  than 4 times per year    Active Member of [employer] or Organizations: No    Attends Engineer, structural: Not on file    Marital Status: Separated  Intimate Partner Violence: Not on file    Review of Systems  All other systems reviewed and are negative.       Objective    BP (!) 155/96   Pulse 78   Temp 98.6 F (37 C) (Oral)   Resp 16   Ht 5\' 3"  (1.6 m)   Wt 128 lb (58.1 kg)   LMP 03/09/2014   SpO2 100%   BMI 22.67 kg/m   Physical Exam Vitals and nursing note reviewed.  Constitutional:      General: She is not in acute distress. Cardiovascular:  Rate and Rhythm: Normal rate and regular rhythm.  Pulmonary:     Effort: Pulmonary effort is normal.     Breath sounds: Normal breath sounds.  Abdominal:     Palpations: Abdomen is soft.     Tenderness: There is no abdominal tenderness.  Neurological:     General: No focal deficit present.     Mental Status: She is alert and oriented to person, place, and time.         Assessment & Plan:   1. Essential hypertension (Primary) Patient with elevated readings. Discussed compliance. Meds refilled.  - losartan (COZAAR) 25 MG tablet; TAKE 1 TABLET(25 MG) BY MOUTH DAILY  Dispense: 30 tablet; Refill: 0 - hydrochlorothiazide (HYDRODIURIL) 50 MG tablet; TAKE 1 TABLET(50 MG) BY MOUTH DAILY  Dispense: 30 tablet; Refill: 0 - CMP14+EGFR - Lipid Panel  2. Other fatigue Monitoring labs ordered - CBC with Differential - TSH  3. Tobacco use disorder Discussed reduction/cessation   No follow-ups on file.   Tommie Raymond, MD

## 2023-07-20 LAB — CMP14+EGFR
ALT: 17 [IU]/L (ref 0–32)
AST: 31 [IU]/L (ref 0–40)
Albumin: 3.8 g/dL — ABNORMAL LOW (ref 3.9–4.9)
Alkaline Phosphatase: 99 [IU]/L (ref 44–121)
BUN/Creatinine Ratio: 5 — ABNORMAL LOW (ref 9–23)
BUN: 4 mg/dL — ABNORMAL LOW (ref 6–20)
Bilirubin Total: 0.7 mg/dL (ref 0.0–1.2)
CO2: 26 mmol/L (ref 20–29)
Calcium: 8.9 mg/dL (ref 8.7–10.2)
Chloride: 100 mmol/L (ref 96–106)
Creatinine, Ser: 0.83 mg/dL (ref 0.57–1.00)
Globulin, Total: 2.7 g/dL (ref 1.5–4.5)
Glucose: 88 mg/dL (ref 70–99)
Potassium: 3.5 mmol/L (ref 3.5–5.2)
Sodium: 141 mmol/L (ref 134–144)
Total Protein: 6.5 g/dL (ref 6.0–8.5)
eGFR: 93 mL/min/{1.73_m2} (ref >59)

## 2023-07-20 LAB — CBC WITH DIFFERENTIAL/PLATELET
Basophils Absolute: 0 10*3/uL (ref 0.0–0.2)
Basos: 1 %
EOS (ABSOLUTE): 0.1 10*3/uL (ref 0.0–0.4)
Eos: 2 %
Hematocrit: 41.1 % (ref 34.0–46.6)
Hemoglobin: 13.4 g/dL (ref 11.1–15.9)
Immature Grans (Abs): 0 10*3/uL (ref 0.0–0.1)
Immature Granulocytes: 0 %
Lymphocytes Absolute: 1.1 10*3/uL (ref 0.7–3.1)
Lymphs: 36 %
MCH: 30.2 pg (ref 26.6–33.0)
MCHC: 32.6 g/dL (ref 31.5–35.7)
MCV: 93 fL (ref 79–97)
Monocytes Absolute: 0.4 10*3/uL (ref 0.1–0.9)
Monocytes: 13 %
Neutrophils Absolute: 1.4 10*3/uL (ref 1.4–7.0)
Neutrophils: 48 %
Platelets: 306 10*3/uL (ref 150–450)
RBC: 4.43 x10E6/uL (ref 3.77–5.28)
RDW: 12.8 % (ref 11.7–15.4)
WBC: 3 10*3/uL — ABNORMAL LOW (ref 3.4–10.8)

## 2023-07-20 LAB — LIPID PANEL
Chol/HDL Ratio: 1.9 {ratio} (ref 0.0–4.4)
Cholesterol, Total: 169 mg/dL (ref 100–199)
HDL: 90 mg/dL (ref >39)
LDL Chol Calc (NIH): 63 mg/dL (ref 0–99)
Triglycerides: 90 mg/dL (ref 0–149)
VLDL Cholesterol Cal: 16 mg/dL (ref 5–40)

## 2023-07-20 LAB — TSH: TSH: 0.904 u[IU]/mL (ref 0.450–4.500)

## 2023-07-22 ENCOUNTER — Telehealth: Payer: Medicaid Other | Admitting: Family Medicine

## 2023-07-22 DIAGNOSIS — B379 Candidiasis, unspecified: Secondary | ICD-10-CM

## 2023-07-22 DIAGNOSIS — T3695XA Adverse effect of unspecified systemic antibiotic, initial encounter: Secondary | ICD-10-CM

## 2023-07-22 DIAGNOSIS — K047 Periapical abscess without sinus: Secondary | ICD-10-CM | POA: Diagnosis not present

## 2023-07-22 MED ORDER — FLUCONAZOLE 150 MG PO TABS
150.0000 mg | ORAL_TABLET | Freq: Every day | ORAL | 0 refills | Status: AC
Start: 2023-07-22 — End: ?

## 2023-07-22 MED ORDER — GABAPENTIN 300 MG PO CAPS: 300.0000 mg | ORAL_CAPSULE | Freq: Every day | ORAL | 0 refills | Status: AC

## 2023-07-22 MED ORDER — AMOXICILLIN-POT CLAVULANATE 875-125 MG PO TABS
1.0000 | ORAL_TABLET | Freq: Two times a day (BID) | ORAL | 0 refills | Status: AC
Start: 2023-07-22 — End: 2023-08-01

## 2023-07-22 NOTE — Progress Notes (Signed)
Virtual Visit Consent   Brenda Blankenship, you are scheduled for a virtual visit with a [facility] provider today. Just as with appointments in the office, your consent must be obtained to participate. Your consent will be active for this visit and any virtual visit you may have with one of our providers in the next 365 days. If you have a MyChart account, a copy of this consent can be sent to you electronically.  As this is a virtual visit, video technology does not allow for your provider to perform a traditional examination. This may limit your provider's ability to fully assess your condition. If your provider identifies any concerns that need to be evaluated in person or the need to arrange testing (such as labs, EKG, etc.), we will make arrangements to do so. Although advances in technology are sophisticated, we cannot ensure that it will always work on either your end or our end. If the connection with a video visit is poor, the visit may have to be switched to a telephone visit. With either a video or telephone visit, we are not always able to ensure that we have a secure connection.  By engaging in this virtual visit, you consent to the provision of healthcare and authorize for your insurance to be billed (if applicable) for the services provided during this visit. Depending on your insurance coverage, you may receive a charge related to this service.  I need to obtain your verbal consent now. Are you willing to proceed with your visit today? Brenda Blankenship has provided verbal consent on 07/22/2023 for a virtual visit (video or telephone). Freddy Finner, NP  Date: 07/22/2023 9:18 AM  Virtual Visit via Video Note   I, Freddy Finner, connected with  Brenda Blankenship  ([id number], 1986-07-07) on 07/22/23 at  9:15 AM EST by a video-enabled telemedicine application and verified that I am speaking with the correct person using two identifiers.  Location: Patient: Virtual Visit Location Patient:  Home Provider: Virtual Visit Location Provider: Home Office   I discussed the limitations of evaluation and management by telemedicine and the availability of in person appointments. The patient expressed understanding and agreed to proceed.    History of Present Illness: Brenda Blankenship is a 37 y.o. who identifies as a female who was assigned female at birth, and is being seen today for left ear pain  Onset was 5-6 days ago noticed pain in left ear after having wisdom tooth removed. Reports having swelling in lymph node, sore throat, and jaw pain. Area on jaw is warm and mildly swollen Associated symptoms are as stated above Modifying factors are they did not cover her with ABX post removal, she has tried OTC without relief. Lupus patient with treatment, low WBC count  Denies chest pain, shortness of breath, fevers, chills   Problems:  Patient Active Problem List   Diagnosis Date Noted   History of vaginal hysterectomy 04/02/2022   Pelvic pain 04/02/2022    Allergies:  Allergies  Allergen Reactions   Hydrocodone Other (See Comments)    Pt states that this medication causes her to pass out. Pt can take oxycodone    Tape Rash and Itching    IV tape   Naprosyn [Naproxen] Itching and Rash    Pt can take ibuprofen and other NSAIDs   Medications:  Current Outpatient Medications:    cyclobenzaprine (FLEXERIL) 5 MG tablet, Take 1 tablet (5 mg total) by mouth 3 (three) times daily as  needed for muscle spasms., Disp: 30 tablet, Rfl: 0   dicyclomine (BENTYL) 10 MG capsule, Take 1 capsule (10 mg total) by mouth every 6 (six) hours as needed for spasms (for abdominal pain)., Disp: 120 capsule, Rfl: 0   DULoxetine (CYMBALTA) 20 MG capsule, Take 1 capsule (20 mg total) by mouth daily. (Patient not taking: Reported on 07/19/2023), Disp: 30 capsule, Rfl: 3   fluconazole (DIFLUCAN) 150 MG tablet, Take 1 tablet (150 mg total) by mouth once a week. (Patient not taking: Reported on 07/19/2023), Disp:  2 tablet, Rfl: 0   gabapentin (NEURONTIN) 300 MG capsule, Take by mouth., Disp: , Rfl:    hydrochlorothiazide (HYDRODIURIL) 50 MG tablet, TAKE 1 TABLET(50 MG) BY MOUTH DAILY, Disp: 30 tablet, Rfl: 0   losartan (COZAAR) 25 MG tablet, TAKE 1 TABLET(25 MG) BY MOUTH DAILY, Disp: 30 tablet, Rfl: 0   meloxicam (MOBIC) 15 MG tablet, Take 1 tablet (15 mg total) by mouth daily. (Patient not taking: Reported on 07/19/2023), Disp: 30 tablet, Rfl: 1   nitrofurantoin, macrocrystal-monohydrate, (MACROBID) 100 MG capsule, Take 1 capsule (100 mg total) by mouth 2 (two) times daily. (Patient not taking: Reported on 07/19/2023), Disp: 10 capsule, Rfl: 0   ondansetron (ZOFRAN-ODT) 4 MG disintegrating tablet, Take by mouth., Disp: , Rfl:    oxyCODONE-acetaminophen (PERCOCET) 5-325 MG tablet, Take 1 tablet by mouth every 6 (six) hours as needed for severe pain. (Patient not taking: Reported on 07/19/2023), Disp: 20 tablet, Rfl: 0   phenazopyridine (PYRIDIUM) 200 MG tablet, Take 1 tablet (200 mg total) by mouth 2 (two) times daily with a meal. (Patient not taking: Reported on 07/19/2023), Disp: 14 tablet, Rfl: 0   predniSONE (DELTASONE) 20 MG tablet, Take 2 tablets daily with breakfast. (Patient not taking: Reported on 07/19/2023), Disp: 10 tablet, Rfl: 0  Observations/Objective: Patient is well-developed, well-nourished in no acute distress.  Resting comfortably  at home.  Head is normocephalic, atraumatic.  No labored breathing.  Speech is clear and coherent with logical content.  Patient is alert and oriented at baseline.    Assessment and Plan:  1. Dental infection (Primary)  - amoxicillin-clavulanate (AUGMENTIN) 875-125 MG tablet; Take 1 tablet by mouth 2 (two) times daily for 10 days.  Dispense: 20 tablet; Refill: 0  2. Antibiotic-induced yeast infection  - fluconazole (DIFLUCAN) 150 MG tablet; Take 1 tablet (150 mg total) by mouth daily.  Dispense: 1 tablet; Refill: 0  Suspected infection post tooth  pulling - Augmentin and ibuprofen prescribed  - Can use ice on outside jaw/cheek for swelling - Can also take tylenol for pain with other medications - Seek in person evaluation if symptoms fail to improve or if they worsen   Reviewed side effects, risks and benefits of medication.    Patient acknowledged agreement and understanding of the plan.   Past Medical, Surgical, Social History, Allergies, and Medications have been Reviewed.     Follow Up Instructions: I discussed the assessment and treatment plan with the patient. The patient was provided an opportunity to ask questions and all were answered. The patient agreed with the plan and demonstrated an understanding of the instructions.  A copy of instructions were sent to the patient via MyChart unless otherwise noted below.   The patient was advised to call back or seek an in-person evaluation if the symptoms worsen or if the condition fails to improve as anticipated.    Freddy Finner, NP

## 2023-07-22 NOTE — Patient Instructions (Signed)
[name], thank you for joining Freddy Finner, NP for today's virtual visit.  While this provider is not your primary care provider (PCP), if your PCP is located in our provider database this encounter information will be shared with them immediately following your visit.   A [facility] MyChart account gives you access to today's visit and all your visits, tests, and labs performed at [facility] " click here if you don't have a [facility] MyChart account or go to [email or web address]  Consent: (Patient) Brenda Blankenship provided verbal consent for this virtual visit at the beginning of the encounter.  Current Medications:  Current Outpatient Medications:    amoxicillin-clavulanate (AUGMENTIN) 875-125 MG tablet, Take 1 tablet by mouth 2 (two) times daily for 10 days., Disp: 20 tablet, Rfl: 0   fluconazole (DIFLUCAN) 150 MG tablet, Take 1 tablet (150 mg total) by mouth daily., Disp: 1 tablet, Rfl: 0   cyclobenzaprine (FLEXERIL) 5 MG tablet, Take 1 tablet (5 mg total) by mouth 3 (three) times daily as needed for muscle spasms., Disp: 30 tablet, Rfl: 0   dicyclomine (BENTYL) 10 MG capsule, Take 1 capsule (10 mg total) by mouth every 6 (six) hours as needed for spasms (for abdominal pain)., Disp: 120 capsule, Rfl: 0   DULoxetine (CYMBALTA) 20 MG capsule, Take 1 capsule (20 mg total) by mouth daily. (Patient not taking: Reported on 07/19/2023), Disp: 30 capsule, Rfl: 3   gabapentin (NEURONTIN) 300 MG capsule, Take by mouth., Disp: , Rfl:    hydrochlorothiazide (HYDRODIURIL) 50 MG tablet, TAKE 1 TABLET(50 MG) BY MOUTH DAILY, Disp: 30 tablet, Rfl: 0   losartan (COZAAR) 25 MG tablet, TAKE 1 TABLET(25 MG) BY MOUTH DAILY, Disp: 30 tablet, Rfl: 0   meloxicam (MOBIC) 15 MG tablet, Take 1 tablet (15 mg total) by mouth daily. (Patient not taking: Reported on 07/19/2023), Disp: 30 tablet, Rfl: 1   nitrofurantoin, macrocrystal-monohydrate, (MACROBID) 100 MG capsule, Take 1 capsule (100  mg total) by mouth 2 (two) times daily. (Patient not taking: Reported on 07/19/2023), Disp: 10 capsule, Rfl: 0   ondansetron (ZOFRAN-ODT) 4 MG disintegrating tablet, Take by mouth., Disp: , Rfl:    oxyCODONE-acetaminophen (PERCOCET) 5-325 MG tablet, Take 1 tablet by mouth every 6 (six) hours as needed for severe pain. (Patient not taking: Reported on 07/19/2023), Disp: 20 tablet, Rfl: 0   phenazopyridine (PYRIDIUM) 200 MG tablet, Take 1 tablet (200 mg total) by mouth 2 (two) times daily with a meal. (Patient not taking: Reported on 07/19/2023), Disp: 14 tablet, Rfl: 0   predniSONE (DELTASONE) 20 MG tablet, Take 2 tablets daily with breakfast. (Patient not taking: Reported on 07/19/2023), Disp: 10 tablet, Rfl: 0   Medications ordered in this encounter:  Meds ordered this encounter  Medications   amoxicillin-clavulanate (AUGMENTIN) 875-125 MG tablet    Sig: Take 1 tablet by mouth 2 (two) times daily for 10 days.    Dispense:  20 tablet    Refill:  0    Supervising Provider:   Merrilee Jansky [[id number]]   fluconazole (DIFLUCAN) 150 MG tablet    Sig: Take 1 tablet (150 mg total) by mouth daily.    Dispense:  1 tablet    Refill:  0    Supervising Provider:   Merrilee Jansky [[id number]]     *If you need refills on other medications prior to your next appointment, please contact your pharmacy*  Follow-Up: Call back or seek an in-person evaluation if the symptoms worsen or  if the condition fails to improve as anticipated.  [facility] [phone]  Other Instructions  Suspected infection post tooth pulling - Augmentin and ibuprofen prescribed  - Can use ice on outside jaw/cheek for swelling - Can also take tylenol for pain with other medications - Seek in person evaluation if symptoms fail to improve or if they worsen    If you have been instructed to have an in-person evaluation today at a local Urgent Care facility, please use the link below. It will take you to a list  of all of our available [facility], including address, phone number and hours of operation. Please do not delay care.  [facility]  If you or a family member do not have a primary care provider, use the link below to schedule a visit and establish care. When you choose a [facility] primary care physician or advanced practice provider, you gain a long-term partner in health. Find a Primary Care Provider  Learn more about [facility]'s in-office and virtual care options: [facility] - Get Care Now

## 2023-07-30 ENCOUNTER — Ambulatory Visit: Payer: Self-pay | Admitting: *Deleted

## 2023-07-30 NOTE — Telephone Encounter (Signed)
 Message from [place] [facility] sent at 07/30/2023 10:40 AM EST  Summary: abxgive diarrhea   Pt states she got a abx for her ear infection and has not been able to finish taking the abx.  she states she has stopped it. The abx gave her diarrhea.  Would like to know what she needs to do?          Call History  Contact Date/Time Type Contact Phone/Fax By  07/30/2023 09:35 AM EST Phone (Incoming) Samone, Guhl (Self) [phone] (M) Arley Nathanel PARAS   Reason for Disposition  Diarrhea continues for > 3 days after stopping the antibiotic  Answer Assessment - Initial Assessment Questions 1. ANTIBIOTIC: What antibiotic are you taking? How many times per day?     Augmentin .    I stopped it    I don't know if the ear infection is gone.   I made it 7 days with the antibiotic.   I'm having diarrhea.    My lyumph node is still a little swollen.    The whole side of my jaw was swollen but all that is gone now.    I had an ear infection  Every time I eat I'm having diarrhea now.   Started the 2nd day of the antibiotic.    I took the Diflucan  for the vaginal yeast infection today.   It's not full blown but the vaginal symptoms are starting.     2. ANTIBIOTIC ONSET: When was the antibiotic started?     I started it 07/23/2023.    3. DIARRHEA SEVERITY: How bad is the diarrhea? How many more stools have you had in the past 24 hours than normal?    - NO DIARRHEA (SCALE 0)   - MILD (SCALE 1-3): Few loose or mushy BMs; increase of 1-3 stools over normal daily number of stools; mild increase in ostomy output.   -  MODERATE (SCALE 4-7): Increase of 4-6 stools daily over normal; moderate increase in ostomy output. * SEVERE (SCALE 8-10; OR 'WORST POSSIBLE'): Increase of 7 or more stools daily over normal; moderate increase in ostomy output; incontinence.     Moderate 4. ONSET: When did the diarrhea begin?      The 2nd day of the antibiotic.     I'm drinking Ensure.   5. BM CONSISTENCY: How loose or watery  is the diarrhea?      Watery.   Bentley settled well but when I drink the Ensure I get diarrhea. 6. VOMITING: Are you also vomiting? If Yes, ask: How many times in the past 24 hours?      When the ear infection started I had Zofran  to take.   No nausea now.  I have nausea with my other regular medicines which is what for the Zofran  was for. 7. ABDOMEN PAIN: Are you having any abdomen pain? If Yes, ask: What does it feel like? (e.g., crampy, dull, intermittent, constant)      No 8. ABDOMEN PAIN SEVERITY: If present, ask: How bad is the pain?  (e.g., Scale 1-10; mild, moderate, or severe)   - MILD (1-3): doesn't interfere with normal activities, abdomen soft and not tender to touch    - MODERATE (4-7): interferes with normal activities or awakens from sleep, abdomen tender to touch    - SEVERE (8-10): excruciating pain, doubled over, unable to do any normal activities       No pain 9. ORAL INTAKE: If vomiting, Have you been able to drink liquids?  How much liquids have you had in the past 24 hours?     No vomiting.   Just diarrhea since being on the antibiotic.   Every time I'm on an antibiotic I get diarrhea and a vaginal yeast infection. 10. HYDRATION: Any signs of dehydration? (e.g., dry mouth [not just dry lips], too weak to stand, dizziness, new weight loss) When did you last urinate?       No 11. EXPOSURE: Have you traveled to a foreign country recently? Have you been exposed to anyone with diarrhea? Could you have eaten any food that was spoiled?       Not asked 12. OTHER SYMPTOMS: Do you have any other symptoms? (e.g., fever, blood in stool)       No 13. PREGNANCY: Is there any chance you are pregnant? When was your last menstrual period?       Not asked  Protocols used: Diarrhea on Antibiotics-A-AH

## 2023-07-30 NOTE — Telephone Encounter (Signed)
  Chief Complaint: Took Augmentin  for an ear infection starting on 07/23/2023.  She did a virtual visit via [facility] on 07/22/2023 and was prescribed the Augmentin  for the ear infection.   Took 7 days of it but she started having diarrhea on day 2 of taking the antibiotic.  It was for a total of 10 days she was to take it.   She stopped taking it after 7 days due to the diarrhea.   She took her Diflucan  this morning for the beginnings of a vaginal yeast infection.    Every time I'm on an antibiotic I get diarrhea and a yeast infection.    The diarrhea usually clears up on its own.   Symptoms: The ear infection symptoms have resolved.   I still have a little swelling in the lymph nodes under my ear but otherwise my ear feels fine.     Frequency: Almost after every time she eats especially when she drinks Ensure.   Has Zofran  she takes for nausea that is a result of her regular medications but she is out of it and needs a refill.    She is calling her pharmacy for the refill.   Pertinent Negatives: Patient denies having nausea or vomiting with all this. Disposition: [] ED /[] Urgent Care (no appt availability in office) / [x] Appointment(In office/virtual)/ []  [facility]/ [] Home Care/ [] Refused Recommended Disposition /[] [facility]/ []  Follow-up with PCP Additional Notes: She already has an appt with Dr. Tanda for 08/19/2023.   No sooner appts with either provider at [facility]   She will go to the urgent care if she feels the need otherwise will see Dr. Tanda on 08/19/2023.    I encouraged her to drink plenty of fluids and electrolyte drinks which she said she is doing.   Denies being dizzy or having headaches.

## 2023-08-18 ENCOUNTER — Ambulatory Visit: Payer: Medicaid Other | Admitting: Podiatry

## 2023-08-19 ENCOUNTER — Encounter: Payer: Self-pay | Admitting: Family Medicine

## 2023-08-19 ENCOUNTER — Telehealth: Payer: Self-pay | Admitting: Family Medicine

## 2023-08-19 ENCOUNTER — Ambulatory Visit: Payer: Medicaid Other | Admitting: Family Medicine

## 2023-08-19 NOTE — Telephone Encounter (Signed)
Called patient to reschedule missed appointment no answer left voicemail

## 2024-07-31 DIAGNOSIS — I10 Essential (primary) hypertension: Secondary | ICD-10-CM

## 2024-07-31 NOTE — Telephone Encounter (Signed)
 FYI Only or Action Required?: Pt scheduled with PCP 09/12/24 and would like to be put on a wait list for a sooner appt. Pt does not want to see another provider. Pt is wondering if she can have BP and lupus prescriptions filled in the mean time.   Patient was last seen in primary care on 07/22/2023 by Moishe Chiquita HERO, NP.  Called Nurse Triage reporting Medication Refill.  Symptoms began ongoing.  Interventions attempted: OTC medications: ibuprofen .  Symptoms are: unchanged.  Triage Disposition: See PCP Within 2 Weeks  Patient/caregiver understands and will follow disposition?:   Reason for Disposition  [1] MILD swelling of both ankles (i.e., pedal edema) AND [2] is a chronic symptom (recurrent or ongoing AND present > 4 weeks)  Answer Assessment - Initial Assessment Questions Ran out of medications for blood pressure and lupus around thanksgiving. Pt states she now has health insurance to refill scripts. Pt states she has ongoing ankle swelling that is Sore - pain 6/10 both ankles. Anxiety attacks last - 07/30/24. Pt taking Ibuprofen .  Pt scheduled with PCP 09/12/24 and would like to be put on a wait list for a sooner appt. Pt does not want to see another provider. Pt is wondering if she can have her prescriptions filled in the mean time.   1. LOCATION: Which ankle is swollen? Where is the swelling?     Both  2. ONSET: When did the swelling start?     Ongoing  3. SWELLING: How bad is the swelling? Or, How large is it? (e.g., mild, moderate, severe; size of localized swelling)      Mild  4. PAIN: Is there any pain? If Yes, ask: How bad is it? (Scale 0-10; or none, mild, moderate, severe)     6/10 5. CAUSE: What do you think caused the ankle swelling?     Being off medications since thanksgiving  6. OTHER SYMPTOMS: Do you have any other symptoms? (e.g., fever, chest pain, difficulty breathing, calf pain)     Denies  7. PREGNANCY: Is there any chance you are pregnant?  When was your last menstrual period?     N/a  Protocols used: Ankle Swelling-A-AH  Copied from CRM [id number]. Topic: Clinical - Red Word Triage >> Jul 31, 2024 12:27 PM Fonda T wrote: Red Word that prompted transfer to Nurse Triage: Pt states she is having a Lupus flare up, is having worsening swelling in legs and ankles.   Pt also reports she has been out of blood pressure medication, and has been out for a while.   Pt is requesting an appt for evaluation.   CB# [phone]

## 2024-07-31 NOTE — Telephone Encounter (Signed)
 Pt not been seen in over a year

## 2024-09-12 ENCOUNTER — Ambulatory Visit: Payer: Self-pay | Admitting: Family Medicine
